# Patient Record
Sex: Female | Born: 1955
Health system: Southern US, Community
[De-identification: ages and names within clinical notes are randomized; demographics above are authoritative.]

## PROBLEM LIST (undated history)

## (undated) DIAGNOSIS — J189 Pneumonia, unspecified organism: Secondary | ICD-10-CM

## (undated) DIAGNOSIS — E7800 Pure hypercholesterolemia, unspecified: Secondary | ICD-10-CM

## (undated) DIAGNOSIS — F988 Other specified behavioral and emotional disorders with onset usually occurring in childhood and adolescence: Secondary | ICD-10-CM

## (undated) DIAGNOSIS — T7840XA Allergy, unspecified, initial encounter: Secondary | ICD-10-CM

## (undated) DIAGNOSIS — E669 Obesity, unspecified: Secondary | ICD-10-CM

## (undated) DIAGNOSIS — Z5189 Encounter for other specified aftercare: Secondary | ICD-10-CM

## (undated) DIAGNOSIS — K219 Gastro-esophageal reflux disease without esophagitis: Secondary | ICD-10-CM

## (undated) DIAGNOSIS — H269 Unspecified cataract: Secondary | ICD-10-CM

## (undated) DIAGNOSIS — G4730 Sleep apnea, unspecified: Secondary | ICD-10-CM

## (undated) DIAGNOSIS — M1990 Unspecified osteoarthritis, unspecified site: Secondary | ICD-10-CM

## (undated) DIAGNOSIS — F419 Anxiety disorder, unspecified: Secondary | ICD-10-CM

## (undated) DIAGNOSIS — D649 Anemia, unspecified: Secondary | ICD-10-CM

## (undated) DIAGNOSIS — T4145XA Adverse effect of unspecified anesthetic, initial encounter: Secondary | ICD-10-CM

## (undated) DIAGNOSIS — T8859XA Other complications of anesthesia, initial encounter: Secondary | ICD-10-CM

## (undated) HISTORY — PX: KNEE ARTHROSCOPY: SUR90

## (undated) HISTORY — PX: COLONOSCOPY: SHX174

## (undated) HISTORY — DX: Allergy, unspecified, initial encounter: T78.40XA

## (undated) HISTORY — DX: Gastro-esophageal reflux disease without esophagitis: K21.9

## (undated) HISTORY — DX: Encounter for other specified aftercare: Z51.89

## (undated) HISTORY — PX: POLYPECTOMY: SHX149

## (undated) HISTORY — PX: OTHER SURGICAL HISTORY: SHX169

## (undated) HISTORY — DX: Obesity, unspecified: E66.9

## (undated) HISTORY — DX: Other specified behavioral and emotional disorders with onset usually occurring in childhood and adolescence: F98.8

## (undated) HISTORY — PX: TONSILLECTOMY: SUR1361

## (undated) HISTORY — DX: Anxiety disorder, unspecified: F41.9

## (undated) HISTORY — DX: Unspecified osteoarthritis, unspecified site: M19.90

## (undated) HISTORY — PX: ADENOIDECTOMY: SUR15

## (undated) HISTORY — PX: UPPER GASTROINTESTINAL ENDOSCOPY: SHX188

## (undated) HISTORY — DX: Sleep apnea, unspecified: G47.30

## (undated) HISTORY — DX: Unspecified cataract: H26.9

## (undated) HISTORY — PX: LAPAROSCOPIC GASTRIC BANDING: SHX1100

## (undated) HISTORY — PX: UVULOPALATOPHARYNGOPLASTY: SHX827

---

## 1998-06-24 ENCOUNTER — Ambulatory Visit: Admission: RE | Admit: 1998-06-24 | Discharge: 1998-06-24 | Payer: Self-pay | Admitting: Oral and Maxillofacial Surgery

## 1998-08-19 ENCOUNTER — Other Ambulatory Visit: Admission: RE | Admit: 1998-08-19 | Discharge: 1998-08-19 | Payer: Self-pay | Admitting: Otolaryngology

## 2000-11-24 ENCOUNTER — Encounter: Admission: RE | Admit: 2000-11-24 | Discharge: 2000-11-24 | Payer: Self-pay | Admitting: Family Medicine

## 2000-12-14 ENCOUNTER — Encounter: Admission: RE | Admit: 2000-12-14 | Discharge: 2000-12-14 | Payer: Self-pay | Admitting: Family Medicine

## 2000-12-30 ENCOUNTER — Encounter: Admission: RE | Admit: 2000-12-30 | Discharge: 2000-12-30 | Payer: Self-pay | Admitting: Family Medicine

## 2001-01-24 ENCOUNTER — Encounter: Admission: RE | Admit: 2001-01-24 | Discharge: 2001-01-24 | Payer: Self-pay | Admitting: Family Medicine

## 2001-02-28 ENCOUNTER — Encounter: Admission: RE | Admit: 2001-02-28 | Discharge: 2001-02-28 | Payer: Self-pay | Admitting: Family Medicine

## 2001-03-28 ENCOUNTER — Ambulatory Visit (HOSPITAL_BASED_OUTPATIENT_CLINIC_OR_DEPARTMENT_OTHER): Admission: RE | Admit: 2001-03-28 | Discharge: 2001-03-28 | Payer: Self-pay | Admitting: Orthopedic Surgery

## 2001-04-04 ENCOUNTER — Encounter: Admission: RE | Admit: 2001-04-04 | Discharge: 2001-04-04 | Payer: Self-pay | Admitting: Family Medicine

## 2001-04-20 ENCOUNTER — Encounter: Admission: RE | Admit: 2001-04-20 | Discharge: 2001-04-20 | Payer: Self-pay | Admitting: Family Medicine

## 2001-05-08 ENCOUNTER — Ambulatory Visit (HOSPITAL_BASED_OUTPATIENT_CLINIC_OR_DEPARTMENT_OTHER): Admission: RE | Admit: 2001-05-08 | Discharge: 2001-05-08 | Payer: Self-pay | Admitting: Family Medicine

## 2001-05-24 ENCOUNTER — Encounter: Admission: RE | Admit: 2001-05-24 | Discharge: 2001-05-24 | Payer: Self-pay | Admitting: Family Medicine

## 2001-06-20 ENCOUNTER — Encounter: Admission: RE | Admit: 2001-06-20 | Discharge: 2001-06-20 | Payer: Self-pay | Admitting: Family Medicine

## 2001-07-13 ENCOUNTER — Encounter: Admission: RE | Admit: 2001-07-13 | Discharge: 2001-07-13 | Payer: Self-pay | Admitting: Family Medicine

## 2001-08-03 ENCOUNTER — Encounter: Admission: RE | Admit: 2001-08-03 | Discharge: 2001-08-03 | Payer: Self-pay | Admitting: Family Medicine

## 2001-08-29 ENCOUNTER — Encounter: Admission: RE | Admit: 2001-08-29 | Discharge: 2001-08-29 | Payer: Self-pay | Admitting: Family Medicine

## 2001-09-07 ENCOUNTER — Encounter: Admission: RE | Admit: 2001-09-07 | Discharge: 2001-09-07 | Payer: Self-pay | Admitting: Family Medicine

## 2001-10-07 ENCOUNTER — Encounter: Admission: RE | Admit: 2001-10-07 | Discharge: 2001-10-07 | Payer: Self-pay | Admitting: Family Medicine

## 2001-11-21 ENCOUNTER — Encounter: Admission: RE | Admit: 2001-11-21 | Discharge: 2001-11-21 | Payer: Self-pay | Admitting: Family Medicine

## 2001-12-09 ENCOUNTER — Encounter: Admission: RE | Admit: 2001-12-09 | Discharge: 2001-12-09 | Payer: Self-pay | Admitting: Family Medicine

## 2001-12-21 ENCOUNTER — Encounter: Admission: RE | Admit: 2001-12-21 | Discharge: 2001-12-21 | Payer: Self-pay | Admitting: Family Medicine

## 2002-01-04 ENCOUNTER — Encounter: Admission: RE | Admit: 2002-01-04 | Discharge: 2002-01-04 | Payer: Self-pay | Admitting: Family Medicine

## 2002-01-23 ENCOUNTER — Encounter: Admission: RE | Admit: 2002-01-23 | Discharge: 2002-01-23 | Payer: Self-pay | Admitting: Family Medicine

## 2002-02-27 ENCOUNTER — Encounter: Admission: RE | Admit: 2002-02-27 | Discharge: 2002-02-27 | Payer: Self-pay | Admitting: Family Medicine

## 2002-04-05 ENCOUNTER — Encounter: Admission: RE | Admit: 2002-04-05 | Discharge: 2002-04-05 | Payer: Self-pay | Admitting: Family Medicine

## 2002-05-10 ENCOUNTER — Encounter: Admission: RE | Admit: 2002-05-10 | Discharge: 2002-05-10 | Payer: Self-pay | Admitting: Family Medicine

## 2002-06-14 ENCOUNTER — Encounter: Admission: RE | Admit: 2002-06-14 | Discharge: 2002-06-14 | Payer: Self-pay | Admitting: Family Medicine

## 2002-07-12 ENCOUNTER — Encounter: Admission: RE | Admit: 2002-07-12 | Discharge: 2002-07-12 | Payer: Self-pay | Admitting: Family Medicine

## 2002-08-04 ENCOUNTER — Encounter: Admission: RE | Admit: 2002-08-04 | Discharge: 2002-08-04 | Payer: Self-pay | Admitting: Family Medicine

## 2002-08-07 ENCOUNTER — Encounter: Payer: Self-pay | Admitting: Family Medicine

## 2002-08-07 ENCOUNTER — Encounter: Admission: RE | Admit: 2002-08-07 | Discharge: 2002-08-07 | Payer: Self-pay | Admitting: Family Medicine

## 2002-08-23 ENCOUNTER — Encounter: Admission: RE | Admit: 2002-08-23 | Discharge: 2002-08-23 | Payer: Self-pay | Admitting: Family Medicine

## 2002-09-11 ENCOUNTER — Encounter: Admission: RE | Admit: 2002-09-11 | Discharge: 2002-09-11 | Payer: Self-pay | Admitting: Family Medicine

## 2002-09-19 ENCOUNTER — Other Ambulatory Visit: Admission: RE | Admit: 2002-09-19 | Discharge: 2002-09-19 | Payer: Self-pay | Admitting: Obstetrics and Gynecology

## 2002-10-09 ENCOUNTER — Encounter: Admission: RE | Admit: 2002-10-09 | Discharge: 2002-10-09 | Payer: Self-pay | Admitting: Sports Medicine

## 2002-11-09 ENCOUNTER — Encounter: Admission: RE | Admit: 2002-11-09 | Discharge: 2002-11-09 | Payer: Self-pay | Admitting: Family Medicine

## 2002-12-21 ENCOUNTER — Encounter: Admission: RE | Admit: 2002-12-21 | Discharge: 2002-12-21 | Payer: Self-pay | Admitting: Family Medicine

## 2003-03-01 ENCOUNTER — Encounter: Admission: RE | Admit: 2003-03-01 | Discharge: 2003-03-01 | Payer: Self-pay | Admitting: Sports Medicine

## 2004-01-09 ENCOUNTER — Encounter: Admission: RE | Admit: 2004-01-09 | Discharge: 2004-01-09 | Payer: Self-pay | Admitting: Family Medicine

## 2004-01-29 ENCOUNTER — Encounter: Payer: Self-pay | Admitting: Internal Medicine

## 2004-02-06 ENCOUNTER — Inpatient Hospital Stay (HOSPITAL_COMMUNITY): Admission: EM | Admit: 2004-02-06 | Discharge: 2004-02-08 | Payer: Self-pay | Admitting: Internal Medicine

## 2004-02-11 ENCOUNTER — Encounter: Payer: Self-pay | Admitting: Internal Medicine

## 2004-02-21 ENCOUNTER — Other Ambulatory Visit: Admission: RE | Admit: 2004-02-21 | Discharge: 2004-02-21 | Payer: Self-pay | Admitting: Obstetrics and Gynecology

## 2004-04-18 ENCOUNTER — Encounter: Admission: RE | Admit: 2004-04-18 | Discharge: 2004-07-17 | Payer: Self-pay | Admitting: Surgery

## 2004-05-29 ENCOUNTER — Ambulatory Visit (HOSPITAL_COMMUNITY): Admission: RE | Admit: 2004-05-29 | Discharge: 2004-05-29 | Payer: Self-pay | Admitting: Surgery

## 2004-06-04 ENCOUNTER — Ambulatory Visit (HOSPITAL_COMMUNITY): Admission: RE | Admit: 2004-06-04 | Discharge: 2004-06-04 | Payer: Self-pay | Admitting: Surgery

## 2004-06-16 ENCOUNTER — Encounter (INDEPENDENT_AMBULATORY_CARE_PROVIDER_SITE_OTHER): Payer: Self-pay | Admitting: *Deleted

## 2004-06-16 ENCOUNTER — Observation Stay (HOSPITAL_COMMUNITY): Admission: RE | Admit: 2004-06-16 | Discharge: 2004-06-17 | Payer: Self-pay | Admitting: Surgery

## 2004-06-30 ENCOUNTER — Emergency Department (HOSPITAL_COMMUNITY): Admission: EM | Admit: 2004-06-30 | Discharge: 2004-06-30 | Payer: Self-pay | Admitting: Emergency Medicine

## 2004-07-29 ENCOUNTER — Encounter: Admission: RE | Admit: 2004-07-29 | Discharge: 2004-07-29 | Payer: Self-pay | Admitting: Surgery

## 2004-11-28 ENCOUNTER — Encounter: Admission: RE | Admit: 2004-11-28 | Discharge: 2005-02-26 | Payer: Self-pay | Admitting: Surgery

## 2005-05-05 ENCOUNTER — Encounter: Admission: RE | Admit: 2005-05-05 | Discharge: 2005-08-03 | Payer: Self-pay | Admitting: Surgery

## 2005-09-29 ENCOUNTER — Encounter: Admission: RE | Admit: 2005-09-29 | Discharge: 2005-09-29 | Payer: Self-pay | Admitting: Family Medicine

## 2006-12-02 ENCOUNTER — Ambulatory Visit (HOSPITAL_BASED_OUTPATIENT_CLINIC_OR_DEPARTMENT_OTHER): Admission: RE | Admit: 2006-12-02 | Discharge: 2006-12-02 | Payer: Self-pay | Admitting: Orthopedic Surgery

## 2007-11-15 ENCOUNTER — Other Ambulatory Visit: Admission: RE | Admit: 2007-11-15 | Discharge: 2007-11-15 | Payer: Self-pay | Admitting: Family Medicine

## 2008-05-14 ENCOUNTER — Encounter: Admission: RE | Admit: 2008-05-14 | Discharge: 2008-05-14 | Payer: Self-pay | Admitting: Family Medicine

## 2008-05-17 ENCOUNTER — Encounter: Admission: RE | Admit: 2008-05-17 | Discharge: 2008-05-17 | Payer: Self-pay | Admitting: Family Medicine

## 2008-12-26 ENCOUNTER — Encounter (INDEPENDENT_AMBULATORY_CARE_PROVIDER_SITE_OTHER): Payer: Self-pay | Admitting: *Deleted

## 2009-01-26 HISTORY — PX: ABDOMINOPLASTY: SUR9

## 2009-02-05 ENCOUNTER — Inpatient Hospital Stay (HOSPITAL_COMMUNITY): Admission: AD | Admit: 2009-02-05 | Discharge: 2009-02-07 | Payer: Self-pay | Admitting: Plastic Surgery

## 2009-02-07 ENCOUNTER — Encounter (INDEPENDENT_AMBULATORY_CARE_PROVIDER_SITE_OTHER): Payer: Self-pay | Admitting: *Deleted

## 2009-04-23 ENCOUNTER — Encounter: Admission: RE | Admit: 2009-04-23 | Discharge: 2009-04-23 | Payer: Self-pay | Admitting: Family Medicine

## 2009-11-19 ENCOUNTER — Other Ambulatory Visit: Admission: RE | Admit: 2009-11-19 | Discharge: 2009-11-19 | Payer: Self-pay | Admitting: Family Medicine

## 2010-04-15 ENCOUNTER — Encounter: Payer: Self-pay | Admitting: Internal Medicine

## 2010-04-15 ENCOUNTER — Ambulatory Visit: Payer: Self-pay | Admitting: Gastroenterology

## 2010-04-15 ENCOUNTER — Telehealth: Payer: Self-pay | Admitting: Internal Medicine

## 2010-04-15 DIAGNOSIS — R112 Nausea with vomiting, unspecified: Secondary | ICD-10-CM | POA: Insufficient documentation

## 2010-04-15 DIAGNOSIS — R1319 Other dysphagia: Secondary | ICD-10-CM | POA: Insufficient documentation

## 2010-04-15 DIAGNOSIS — K219 Gastro-esophageal reflux disease without esophagitis: Secondary | ICD-10-CM | POA: Insufficient documentation

## 2010-04-15 DIAGNOSIS — Z9884 Bariatric surgery status: Secondary | ICD-10-CM | POA: Insufficient documentation

## 2010-04-15 HISTORY — DX: Nausea with vomiting, unspecified: R11.2

## 2010-04-18 ENCOUNTER — Telehealth: Payer: Self-pay | Admitting: Internal Medicine

## 2010-04-30 ENCOUNTER — Ambulatory Visit (HOSPITAL_COMMUNITY): Admission: RE | Admit: 2010-04-30 | Discharge: 2010-04-30 | Payer: Self-pay | Admitting: Internal Medicine

## 2010-04-30 ENCOUNTER — Ambulatory Visit: Payer: Self-pay | Admitting: Internal Medicine

## 2010-05-01 ENCOUNTER — Ambulatory Visit (HOSPITAL_COMMUNITY): Admission: RE | Admit: 2010-05-01 | Discharge: 2010-05-01 | Payer: Self-pay | Admitting: Internal Medicine

## 2010-05-01 ENCOUNTER — Encounter: Payer: Self-pay | Admitting: Internal Medicine

## 2010-07-18 ENCOUNTER — Encounter: Payer: Self-pay | Admitting: Internal Medicine

## 2010-07-28 ENCOUNTER — Encounter
Admission: RE | Admit: 2010-07-28 | Discharge: 2010-07-28 | Payer: Self-pay | Source: Home / Self Care | Attending: Surgery | Admitting: Surgery

## 2010-08-04 ENCOUNTER — Encounter: Admission: RE | Admit: 2010-08-04 | Discharge: 2010-08-04 | Payer: Self-pay | Admitting: Family Medicine

## 2010-08-14 ENCOUNTER — Encounter: Payer: Self-pay | Admitting: Internal Medicine

## 2010-08-14 ENCOUNTER — Telehealth: Payer: Self-pay | Admitting: Internal Medicine

## 2010-10-19 ENCOUNTER — Encounter: Payer: Self-pay | Admitting: Family Medicine

## 2010-10-28 NOTE — Progress Notes (Signed)
Summary: TRIAGE-DYSPHAGIA  Phone Note Call from Patient Call back at [phone]   Caller: Patient Call For: Dr. Juanda Chance Reason for Call: Talk to Nurse Summary of Call: severe dysphagia... trouble swallowing liquids... would like to be worked in Initial call taken by: Vallarie Mare,  April 15, 2010 10:35 AM  Follow-up for Phone Call        Pt. c/o dysphagia for over 1 year. Dysphagia worsening for 2 weeks, this morning severe dysphagia and pain in esophagus.  Pt. will see Amy Esterwood PAC today at 2:30pm. She will remain NPO until OV. Follow-up by: Laureen Ochs LPN,  April 15, 2010 11:43 AM

## 2010-10-28 NOTE — Letter (Signed)
Summary: Patient Notice-[facility]  [address] [place], [place] [address]   Phone: [phone]  Fax: [phone]        May 01, 2010 MRN: [id number]    CHARISSE WENDELL [address] [place], [place]  [address]    Dear Ms. Cedillo,  I am pleased to inform you that the biopsies taken during your recent endoscopic examination did not show any evidence of cancer upon pathologic examination.  However, your biopsies indicate you have a condition known as Barrett's esophagus. While not cancer, it is pre-cancerous (can progress to cancer) and needs to be monitored with repeat endoscopic examination and biopsies.  Fortunately, it is quite rare that this develops into cancer, but careful monitoring of the condition along with taking your medication as prescribed is important in reducing the risk of developing cancer.  It is my recommendation that you have a repeat upper gastrointestinal endoscopic examination in 2_ years.  Additional information/recommendations:  __Please call [phone] to schedule a return visit to further      evaluate your condition.  x__Continue with treatment plan as outlined the day of your exam.  Please call us if you have or develop heartburn, reflux symptoms, any swallowing problems, or if you have questions about your condition that have not been fully answered at this time.  Sincerely,  Hart Carwin MD  This letter has been electronically signed by your physician.  Appended Document: Patient Notice-[facility] letter mailed to patient's home

## 2010-10-28 NOTE — Letter (Signed)
Summary: [facility]   Imported By: Sherian Rein 08/18/2010 11:46:32  _____________________________________________________________________  External Attachment:    Type:   Image     Comment:   External Document

## 2010-10-28 NOTE — Progress Notes (Signed)
Summary: Change PPI  ---- Converted from flag ----   #60, 3 refills---- 08/12/2010 1:19 PM, Dottie Nelson-Smith CMA (AAMA) wrote: Dr Juanda Chance- I have a note from patient's pharmacy that her insurance prefers her to take either omeprazole or pantoprazole instead of Aciphex. From the notes I can find, She was last given aciphex 20 mg ONCE daily dosing on 04/18/10 w/3 refills. However, per your endoscopy note, she should be taking Aciphex TWO times a day. I see that she has had some gastric surgery and symptoms so before I change anything, I wanted to make sure that you would be okay with her switching medications...Marland KitchenMarland KitchenAND, do you truly want her on two times a day dosing or is she okay on once daily dosing (she had barretts esophagus on pathology)? ------------------------------ ---- 08/13/2010 10:32 PM, Hart Carwin MD wrote: please stay on twice a day dose of PPI, may have Pantoprazole 40 mg by mouth  two times a day.   Phone Note Outgoing Call   Call placed by: Lamona Curl CMA Duncan Dull),  August 14, 2010 8:15 AM Call placed to: Patient Summary of Call: I have spoken to patient and advised her of her insurance company wishes that she try pantoprazole and/or omeprazole rather than Aciphex. Advised her that per Dr Regino Schultze recommendations, it is okay for her to be on pantoprazole instead of Aciphex. I have also asked the patient if she has indeed been taking Aciphex twice daily or once daily. She has only been taking her medication once daily. I have asked her to increase her PPI to twice daily dosing and have told her that we will send a new prescription to her pharmacy to reflect recommended changes in her medication. Patient verbalizes understanding. Initial call taken by: Lamona Curl CMA Duncan Dull),  August 14, 2010 8:18 AM    New/Updated Medications: PROTONIX 40 MG TBEC (PANTOPRAZOLE SODIUM) Take 1 tablet by mouth two times a day (pharmacy-please d/c any remaining refills on  Aciphex) Prescriptions: PROTONIX 40 MG TBEC (PANTOPRAZOLE SODIUM) Take 1 tablet by mouth two times a day (pharmacy-please d/c any remaining refills on Aciphex)  #60 x 3   Entered by:   Lamona Curl CMA (AAMA)   Authorized by:   Hart Carwin MD   Signed by:   Lamona Curl CMA (AAMA) on 08/14/2010   Method used:   [facility]        [facility]  [phone]* (retail)       [address] [place], [place]  [address]       Ph: [phone] or [phone]       Fax: [phone]   RxID:   [id number]

## 2010-10-28 NOTE — Procedures (Signed)
Summary: Upper Endoscopy  Patient: [name] Note: All result statuses are Final unless otherwise noted.  Tests: (1) Upper Endoscopy (EGD)   EGD Upper Endoscopy       DONE     [facility]     [address] [place], [place]  [address]           ENDOSCOPY PROCEDURE REPORT           PATIENT:  Terri [name], Terri [name]  MR#:  [id number]     BIRTHDATE:  Feb 07, 1956, 53 yrs. old  GENDER:  female           ENDOSCOPIST:  Hedwig Morton. Juanda Chance, MD     Referred by:  Carolin Coy, M.D.           PROCEDURE DATE:  04/30/2010     PROCEDURE:  EGD with biopsy     ASA CLASS:  Class I     INDICATIONS:  vomiting, GERD s/p remote lap band 6 yrs ago, now     severe reflux refractory to PPI's, early satiety, wght loss 10 lbs                 MEDICATIONS:   Versed 6 mg, Fentanyl 50 mcg     TOPICAL ANESTHETIC:  Cetacaine Spray           DESCRIPTION OF PROCEDURE:   After the risks benefits and     alternatives of the procedure were thoroughly explained, informed     consent was obtained.  The  endoscope was introduced through the     mouth and advanced to the second portion of the duodenum, without     limitations.  The instrument was slowly withdrawn as the mucosa     was fully examined.     <<PROCEDUREIMAGES>>           Post-operative change was noted in the body of the stomach (see     image1, image2, image5, image6, and image7). obstruction at 44 cm,     small 4-5 cm gastric pouch with retained food freely rexluxing     into the esophagus ( z-line at 40cm), obstruction due to a Band     placed surgically in 2005  irregular Z-line. With standard     forceps, a biopsy was obtained and sent to pathology (see image9     and image8).  Retained food was present in the cardia (see image7     and image1). liquid food retained above the band  Otherwise the     examination was normal (see image3 and image4).    Retroflexed     views revealed no abnormalities.    The scope was then withdrawn     from the  patient and the procedure completed.           COMPLICATIONS:  None           ENDOSCOPIC IMPRESSION:     1) Post-operative change in the body of the stomach     2) Irregular Z-line     3) Food, retained in the cardia     4) Otherwise normal examination     obstruction at the cardia due to surgical band, with evidence of     retained food and free reflux into the esophagus, the slipping of     the band is a possibility,     RECOMMENDATIONS:     1) Anti-reflux regimen to be follow  small feedings and liquids     continue Aciphex 20 bid           Pt to see Dr Ezzard Standing to discuss relaxing of the band     UGI series to assess level and degree of the obstruction           REPEAT EXAM:  In 0 year(s) for.           ______________________________     Hedwig Morton. Juanda Chance, MD           CC:           n.     eSIGNED:   Hedwig Morton. Brodie at 04/30/2010 11:14 AM           Dorna Leitz, [id number]  Note: An exclamation mark (!) indicates a result that was not dispersed into the flowsheet. Document Creation Date: 04/30/2010 11:15 AM _______________________________________________________________________  (1) Order result status: Final Collection or observation date-time: 04/30/2010 11:01 Requested date-time:  Receipt date-time:  Reported date-time:  Referring Physician:   Ordering Physician: Lina Sar [phone]) Specimen Source:  Source: Launa Grill Order Number: [id number] Lab site:   Appended Document: Upper Endoscopy    Clinical Lists Changes  Orders: Added new Test order of [facility]) - Signed

## 2010-10-28 NOTE — Procedures (Signed)
Summary: Prep/[facility]  Prep/[facility]   Imported By: Lester Sarcoxie 04/17/2010 09:33:26  _____________________________________________________________________  External Attachment:    Type:   Image     Comment:   External Document

## 2010-10-28 NOTE — Procedures (Signed)
Summary: COLONOSCOPY   Colonoscopy  Procedure date:  01/29/2004  Findings:      Location:  [facility].    Colonoscopy  Procedure date:  01/29/2004  Findings:      Location:  [facility].    Patient Name: Terri [name], Terri [name] MRN:  Procedure Procedures: Colonoscopy CPT: [address].    with Hot Biopsy(s)CPT: [id number].  Personnel: Endoscopist: Jett Fukuda L. Juanda Chance, MD.  Exam Location: Exam performed in Outpatient Clinic. Outpatient  Patient Consent: Procedure, Alternatives, Risks and Benefits discussed, consent obtained, from patient. Consent was obtained by the RN.  Indications Symptoms: rectal pai, itching, hx of fissure, .  History  Current Medications: Patient is not currently taking Coumadin.  Pre-Exam Physical: Performed Jan 29, 2004. Cardio-pulmonary exam, Rectal exam, HEENT exam , Abdominal exam, Extremity exam, Neurological exam, Mental status exam WNL.  Exam Exam: Extent of exam reached: Cecum, extent intended: Cecum.  The cecum was identified by appendiceal orifice and IC valve. Colon retroflexion performed. Images taken. ASA Classification: I. Tolerance: good.  Monitoring: Pulse and BP monitoring, Oximetry used. Supplemental O2 given.  Colon Prep Used Miralax for colon prep. Prep results: good.  Sedation Meds: Patient assessed and found to be appropriate for moderate (conscious) sedation. Fentanyl 50 mcg. given IV. Versed 5 mg. given IV.  Findings NORMAL EXAM: Cecum.  - MULTIPLE POLYPS: Rectum. minimum size 3 mm, maximum size 4 mm. Procedure:  hot biopsy, removed, Polyp retrieved, 2 polyps ICD9: Colon Polyps: 211.3. Comments: diminutive polyps at 5 cm from rectal os.   Assessment Abnormal examination, see findings above.  Diagnoses: 211.3: Colon Polyps.   Comments: s/p polypectomy times two Events  Unplanned Interventions: No intervention was required.  Unplanned Events: There were no complications. Plans Medication Plan: Await  pathology.  Patient Education: Patient given standard instructions for: Yearly hemoccult testing recommended. Patient instructed to get routine colonoscopy every 5 years.  Comments: pt has an appointment with dr L.Crow-LomAX TOMORROW CONCERNING ANAL WARTS Disposition: After procedure patient sent to recovery. After recovery patient sent home.    CC: [name]  This report was created from the original endoscopy report, which was reviewed and signed by the above listed endoscopist.

## 2010-10-28 NOTE — Procedures (Signed)
Summary: SIGMOIDOSCOPY   Flexible Sigmoidoscopy  Procedure date:  02/11/2004  Comments:      Location: [facility].    Patient Name: Terri, [name] MRN:  Procedure Procedures: Flexible Proctosigmoidoscopy CPT: [address].  Personnel: Endoscopist: Elliona Doddridge L. Juanda Chance, MD.  Exam Location: Exam performed in Outpatient Clinic. Outpatient  Patient Consent: Procedure, Alternatives, Risks and Benefits discussed, consent obtained, from patient.  Indications  Evaluation of: Anemia  Symptoms: Hematochezia. post polypectomy bleed, pt  had a colonoscopy and rectal polypectomy times two on 01/29/04 using hot biopsies.  Surveillance of: Last exam: May, 2005.  History  Current Medications: Patient is not currently taking Coumadin.  Pre-Exam Physical: Performed Feb 11, 2004. Cardio-pulmonary exam  WNL. Rectal exam, HEENT exam , Abdominal exam, Extremity exam, Neurological exam, Mental status exam WNL.  Exam Exam: Extent visualized: Descending Colon. Extent of exam: 50 cm. Colon retroflexion performed. Images taken. ASA Classification: I. Tolerance: good.  Colon Prep Used Fleets enema for colon prep. Prep: good.  Sedation Meds: Fentanyl 100 mcg. given IV. Versed 5 given IV.  Findings - SOLITARY ULCER: Rectum. Maximum size: 1 mm. ICD9: .   Assessment Abnormal examination, see findings above.  Diagnoses: .   Comments: healing postpolypectomy ulcer Events  Unplanned Intervention: No intervention was required.  Unplanned Events: There were no complications. Plans  Post Exam Instructions: No aspirin or non-steroidal containing medications: 2 weeks.  Medication Plan: Hemorrhoidal Medications: Anusol Suppositories 1 ac, starting Feb 11, 2004  Iron: 1 QD, starting Feb 11, 2004   Comments: low residue and soft diet for several days Disposition: After procedure patient sent to recovery. After recovery patient sent home.    CC: Terri [name]  This report was  created from the original endoscopy report, which was reviewed and signed by the above listed endoscopist.

## 2010-10-28 NOTE — Discharge Summary (Signed)
Summary: DISCHARGE SUMMARY  NAMERICKIA, FREEBURG                  ACCOUNT NO.:  [id number]      MEDICAL RECORD NO.:  [id number]          PATIENT TYPE:  INP      LOCATION:  1533                         FACILITY:  [facility]      PHYSICIAN:  Alfredia Ferguson, M.D.  DATE OF BIRTH:  04-13-1956      DATE OF ADMISSION:  02/05/2009   DATE OF DISCHARGE:  02/07/2009                                  DISCHARGE SUMMARY      ADMISSION DIAGNOSES:   1. Postoperative bleeding following abdominoplasty performed at       [facility].   2. Acute anemia secondary to number 1.      DISCHARGE DIAGNOSES:   1. Postoperative bleeding following abdominoplasty performed at       [facility].   2. Acute anemia secondary to number 1.      OPERATIONS PERFORMED:  None.      CONSULTATIONS PROCURED:  None.      HISTORY OF PRESENT ILLNESS:  This is a 55 year old woman who has   previously undergone gastric bypass procedure with Lap-Band technique.   The patient has lost approximately 100 pounds and was left with a   significant amount of redundant skin in her abdomen.  She underwent an   extended abdominoplasty as an outpatient at the [facility] on Feb 04, 2009.  Her surgical procedure was uncomplicated   with minimal bleeding.  On the evening following surgery while at the   Surgical Center overnight stay, the patient developed some bleeding as   noted by increased drainage in her Blake drains.  The bleeding continued   through the evening and she ultimately ended up putting out over 500 mL   of bloody drainage.  In the middle of the evening the patient's   hemoglobin was measured and found to be 9.9.  On Feb 05, 2009, the day   of her discharge from the Surgical Center, her hemoglobin at the center   was also noted to be 5.6.  She was transferred to [facility]   for admission and transfusion and possible reexploration.      PAST MEDICAL HISTORY:   1.  Significant for obesity.   2. Depression.   3. Attention deficit disorder.      PAST SURGICAL HISTORY:  gastric bypass with a Lap-Band technique.      ALLERGIES:  None.      MEDICATIONS:   1. Prozac 40 mg daily.   2. Adderall 40 mg daily.   3. Multivitamins.   4. Roxicet 1-2 teaspoons every 4 hours.      PHYSICAL EXAMINATION:  At the time of admission the patient had no   evidence of abdominal hematomas.   ABDOMEN:  Soft.  She was draining bloody drainage from her drains,   although the drainage output had slowed.   HEENT:  Normal.   CARDIAC EXAM:  Normal S1 and S2 without murmurs.   RESPIRATORY:  Clear breath sounds to auscultation.   EXTREMITIES:  No pedal edema, no evidence of DVT.      ADMISSION LABORATORY VALUES:  The patient's hemoglobin on admission was   7.7 which was a bit higher than what was noted at the Surgical Center.      HOSPITAL COURSE:  The patient was placed in a monitored bed.  She   initially underwent transfusion with 2 units of packed red blood cells   and was placed at bed rest.  The patient's hemoglobin climbed into the   low 8 range.  I opted to give a third unit of blood which brought her   hemoglobin up to 9.4.  This was over the first 24 hours of admission.   Hemoglobin was rechecked on the afternoon of Feb 06, 2009 and noted to   have dropped a bit down to 8.9.  I opted to observe her for one   additional day.  On the day of discharge hemoglobin was repeated and   hemoglobin was back up to 9.4.  Her PT/PTT was normal on the day of   discharge.  The character of the drainage had changed in that it had   become thin and pink.  The drain output had declined significantly.  The   patient is able to ambulate without difficulty.  She has no significant   pain.  She is tolerating a regular diet.  She has been afebrile   throughout her entire hospital course.  The patient's husband   understands clearly how to care for her in terms of managing the drains.    Followup will be provided in my office on Feb 11, 2009.      DISCHARGE MEDICATIONS:  Include:   1. Prozac 40 mg.   2. Adderall 40 mg.   3. Multivitamins with iron.   4. Ceftin 2 teaspoons t.i.d.   5. Roxicet 1-2 teaspoons every 4 hours.      Prescriptions have previously been given to the patient prior to this   admission.      CONDITION AT DISCHARGE:  Improved.               Alfredia Ferguson, M.D.   Electronically Signed            WBB/MEDQ  D:  02/07/2009  T:  02/07/2009  Job:  [id number]

## 2010-10-28 NOTE — Miscellaneous (Signed)
Summary: [employer] Approval-Pantoprazole       Case ID: [id number] Member Number: [id number] Case Type: Initial Review Case Start Date: 08/14/2010 Case Status: Coverage has been APPROVED. You will receive a confirmation letter confirming approval of this medication. The patient will also be notified of this approval via an automated outbound phone call or a letter. Please allow approximately 2 hours to update our system with the approval. Once updated, the prescription can be re-submitted.   Coverage Start Date: 07/24/2010 Coverage End Date: 08/14/2011  Patient First Name: Terri [name] Patient Last Name: FITZSIMMONS DOB: 1956-09-02 Patient Street Address: [address]   Patient City: [place] Patient State: [place] Patient Zip: [phone]  Drug Name & Strength: Pantoprazole Sodium 40 Mg

## 2010-10-28 NOTE — Letter (Signed)
Summary: Recall Colonoscopy Date Change Letter  [facility]  [address] [place], [place] [address]   Phone: [phone]  Fax: [phone]      December 26, 2008 MRN: [id number]   [facility] [address] [place], [place]  [address]   Dear Ms. Silva,   Previously you were recommended to have a repeat colonoscopy around this time. Your chart was recently reviewed by Dr. Juanda Chance of [facility]. Follow up colonoscopy is now recommended in May 2012. This revised recommendation is based on current, nationally recognized guidelines for colorectal cancer screening and polyp surveillance. These guidelines are endorsed by the American Cancer Society, [employer] on Colorectal Cancer as well as numerous other major medical organizations.  Please understand that our recommendation assumes that you do not have any new symptoms such as bleeding, a change in bowel habits, anemia, or significant abdominal discomfort. If you do have any concerning GI symptoms or want to discuss the guideline recommendations, please call to arrange an office visit at your earliest convenience. Otherwise we will keep you in our reminder system and contact you 1-2 months prior to the date listed above to schedule your next colonoscopy.  Thank you,  Hedwig Morton. Juanda Chance, M.D.  [facility] [phone]

## 2010-10-28 NOTE — Progress Notes (Signed)
Summary: Triage  Phone Note Call from Patient Call back at [phone]   Caller: Patient Call For: Dr. Juanda Chance Reason for Call: Talk to Nurse Summary of Call: Still having nausea, burning, vomiting.  Initial call taken by: Karna Christmas,  April 18, 2010 10:22 AM  Follow-up for Phone Call        Pt. saw Gunnar Fusi on 04-15-10 and has an Endo. scheduled on 04-30-10. She started the Achipex on 04-16-10. She is now able to eat pudding and tuna, but she still has nausea, some vomiting, heartburn and bloating.  1) Increase Achipex to two times a day for 5 days, then back to once daily. 2) Eat a very soft,bland diet. Try smaller amts. more frequently. 3) Gas-x,Phazyme, etc. as needed for gas & bloating. 4) [facility] scheduled Endoscopy appt. 5) If symptoms become worse call back immediately. 6) I will call pt., if new orders, after MD reviews.   Follow-up by: Laureen Ochs LPN,  April 18, 2010 10:39 AM  Additional Follow-up for Phone Call Additional follow up Details #1::        reviewed and agree Additional Follow-up by: Hart Carwin MD,  April 18, 2010 1:17 PM    New/Updated Medications: ACIPHEX 20 MG TBEC (RABEPRAZOLE SODIUM) Take 1 tab 30 min prior to breakfast every day. Prescriptions: ACIPHEX 20 MG TBEC (RABEPRAZOLE SODIUM) Take 1 tab 30 min prior to breakfast every day.  #30 x 3   Entered by:   Laureen Ochs LPN   Authorized by:   Hart Carwin MD   Signed by:   Laureen Ochs LPN on 95/62/1308   Method used:   [facility]  [phone]* (retail)       [address] [place], [place]  [address]       Ph: [phone] or [phone]       Fax: [phone]   RxID:   [id number]

## 2010-10-28 NOTE — Op Note (Signed)
Summary: LAP.BAND SURGERY   NAME:  Terri [name], Terri [name]                  ACCOUNT NO.:  [id number]   MEDICAL RECORD NO.:  [id number]          PATIENT TYPE:  OBV   LOCATION:  0154                         FACILITY:  [facility] : [facility]   PHYSICIAN:  [name], M.D.  DATE OF BIRTH:  07-15-1956   DATE OF PROCEDURE:  06/16/2004  DATE OF DISCHARGE:                                 OPERATIVE REPORT   PREOPERATIVE DIAGNOSIS:  Morbid obesity with a body mass index of 46.6.   POSTOPERATIVE DIAGNOSIS:  Morbid obesity with a body mass index of 46.6.   PROCEDURE:  Laparoscopic band placement (size 10).   SURGEON:  [name], M.D.   FIRST ASSISTANT:  Dr. Jaclynn Guarneri and Dr. Reita Chard was supervising  attending.   ANESTHESIA:  General endotracheal anesthesia.   ESTIMATED BLOOD LOSS:  Minimal.   INDICATIONS FOR PROCEDURE:  [name] is a 55 year old white female who has  been morbidly obese much of her adult life.  She has been through our  bariatric program.  She has significant comorbidities to her weight,  including hypercholesterolemia, obstructive sleep apnea on CPAP, asthma,  gastroesophageal reflux disease, and orthopedic joint disease, including a  torn meniscus of her left knee.  She has looked at both the Roux-en-Y bypass  and the lap band and has elected to proceed with a lap band.  She also  understands that this will be my first operation, although I have assisted  on several, and that Dr. Reita Chard of the [facility] region, will be in  supervising this.   OPERATIVE NOTE:  Patient is placed in a supine position and given a general  endotracheal anesthetic.  She had a Foley catheter in place, an orogastric  tube in place.  Was given 1 gm of cefotetan at the initiation of the  procedure.  Her abdomen was prepped with Betadine solution and sterilely  draped.  I entered the abdominal cavity using the Optiview Ethicon trocar  into the left upper quadrant.  I then placed a 10 mm Ethicon  trocar to the  left paramedian and a 12 mm to the right paramedian, a 5 mm left subcostal  on the right side and then a 5 mm for the liver retractor.  Abdominal  exploration revealed omentum covering most of the bowel.  The liver was  somewhat fatty.  The stomach was unremarkable.   I first started dissection along the angle of His, making a window there.  I  opened the lesser sac, found the right crus, and used a finger dissector to  blunt off the edge of the right crus, going behind the esophagus of the  stomach and anteriorly.  We then used a #10 INAMED LAP-BAND and passed this  in a pars flaccida technique around the stomach and cinched this down.   I used a sizer around, and it did not appear to be too tight when I placed  this.  I then sutured the stomach over the lap band using three sutures of 2-  0 Ethibond suture.  I thought I  got good bites of the stomach on both sides.   The band then seemed to lay in the correct orientatin.  The sizer had been  removed.  I then brought the lap band tubing out through the right subcostal  incision, extended that incision over the rectus muscle, and sewed in the  port with 2-0 Prolene, tacking the port to the abdominal wall.  The port is  at the lateral edge of the incision and maybe a centimeter below the lateral  edge of the port placement incision.  The remainder of the port was pushed  into the abdominal cavity.  I closed the skin at each site with 5-0 Vicryl  suture, and painted the wound with tincture of Benzoin and steri-stripped  it.   The patient tolerated the procedure well and was transported to the recovery  room in good condition.  Sponge and needle counts were correct at the end of  the case.     Davi   DHN/MEDQ  D:  06/16/2004  T:  06/16/2004  Job:  [id number]   cc:   Otilio Connors. [name], M.D.  [address]  [place]  [place] [address]  Fax: [phone]   Cordelia Pen A. Rosalio Macadamia, M.D.  [address]  [place]  [place]  [address]  Fax: [phone]   Lina Sar, M.D. [facility]

## 2010-10-28 NOTE — Medication Information (Signed)
Summary: Pantoprazole approved/[employer]  Pantoprazole approved/[employer]   Imported By: Sherian Rein 08/20/2010 13:57:38  _____________________________________________________________________  External Attachment:    Type:   Image     Comment:   External Document

## 2010-10-28 NOTE — Procedures (Signed)
Summary: ENDOSCOPY   EGD  Procedure date:  01/29/2004  Findings:      Location: [facility]    EGD  Procedure date:  01/29/2004  Findings:      Location: [facility]    Patient Name: Terri [name], Terri [name] MRN:  Procedure Procedures: Panendoscopy (EGD) CPT: 43235.    with biopsy(s)/brushing(s). CPT: D1846139.  Personnel: Endoscopist: Aurelia Gras L. Juanda Chance, MD.  Exam Location: Exam performed in Outpatient Clinic. Outpatient  Patient Consent: Procedure, Alternatives, Risks and Benefits discussed, consent obtained, from patient. Consent was obtained by the RN.  Indications Symptoms: Reflux symptoms for 1-5 yrs,  History  Current Medications: Patient is not currently taking Coumadin.  Pre-Exam Physical: Performed Jan 29, 2004  Cardio-pulmonary exam, HEENT exam, Abdominal exam, Extremity exam, Neurological exam, Mental status exam WNL.  Exam Exam Info: Maximum depth of insertion Duodenum, intended Duodenum. Vocal cords visualized. Gastric retroflexion performed. Images taken. ASA Classification: I. Tolerance: good.  Sedation Meds: Patient assessed and found to be appropriate for moderate (conscious) sedation. Fentanyl 50 mcg. given IV. Versed 5 mg. given IV. Cetacaine Spray 2 sprays given aerosolized.  Monitoring: BP and pulse monitoring done. Oximetry used. Supplemental O2 given  Findings DIAGNOSTIC TEST: from Distal Esophagus. Reason: r/o esophagitis. Comments: normal g-e junction, no hiatal hernia.  DIAGNOSTIC TEST: from Antrum. RUT done, results pending Reason: r/o H. Pylori.   Assessment Normal examination.  Comments: s/p biopsies and CLO test Events  Unplanned Intervention: No unplanned interventions were required.  Unplanned Events: There were no complications. Plans Medication(s): Await pathology. PPI: Pantoprazole/Protonix 40 mg QD, starting Jan 29, 2004   Patient Education: Patient given standard instructions for: Reflux.    Disposition: After procedure patient sent to recovery. After recovery patient sent home.    CC: [name]  This report was created from the original endoscopy report, which was reviewed and signed by the above listed endoscopist.

## 2010-10-28 NOTE — Letter (Signed)
Summary: EGD Instructions  [facility]  [address] [place], [place] [address]   Phone: [phone]  Fax: [phone]       Terri [name]    [birth year]    MRN: [id number]       Procedure Day /Date: 04-30-10     Arrival Time: 8:30 AM      Procedure Time: 9:30 Am     Location of Procedure:                     [facility] ( Outpatient Registration)   PREPARATION FOR ENDOSCOPY   On 8-3-11THE DAY OF THE PROCEDURE:  1.   No solid foods, milk or milk products are allowed after midnight the night before your procedure.  2.   Do not drink anything colored red or purple.  Avoid juices with pulp.  No orange juice.  3.  You may drink clear liquids until 5:30 AM , which is 4 hours before your procedure.                                                                                                CLEAR LIQUIDS INCLUDE: Water Jello Ice Popsicles Tea (sugar ok, no milk/cream) Powdered fruit flavored drinks Coffee (sugar ok, no milk/cream) Gatorade Juice: apple, white grape, white cranberry  Lemonade Clear bullion, consomm, broth Carbonated beverages (any kind) Strained chicken noodle soup Hard Candy   MEDICATION INSTRUCTIONS  Unless otherwise instructed, you should take regular prescription medications with a small sip of water as early as possible the morning of your procedure.        OTHER INSTRUCTIONS  You will need a responsible adult at least 53 years of age to accompany you and drive you home.   This person must remain in the waiting room during your procedure.  Wear loose fitting clothing that is easily removed.  Leave jewelry and other valuables at home.  However, you may wish to bring a book to read or an iPod/MP3 player to listen to music as you wait for your procedure to start.  Remove all body piercing jewelry and leave at home.  Total time from sign-in until discharge is approximately 2-3 hours.  You should go home directly  after your procedure and rest.  You can resume normal activities the day after your procedure.  The day of your procedure you should not:   Drive   Make legal decisions   Operate machinery   Drink alcohol   Return to work  You will receive specific instructions about eating, activities and medications before you leave.    The above instructions have been reviewed and explained to me by   _______________________    I fully understand and can verbalize these instructions _____________________________ Date _________

## 2010-10-28 NOTE — Letter (Signed)
Summary: [facility]   Imported By: Lester Patrick 06/18/2010 08:55:51  _____________________________________________________________________  External Attachment:    Type:   Image     Comment:   External Document

## 2010-10-28 NOTE — Assessment & Plan Note (Signed)
Summary: Severe Dyaphsgia   History of Present Illness Visit Type: Initial Visit Primary GI MD: Lina Sar MD Primary Provider: Hall Busing MD Chief Complaint: dysphagia solids and liquids History of Present Illness:   Patient is known to Dr. Juanda Chance for history of colon polyps. She hasn't been seen in several years, surveillance colonoscopy set for 2012. Patient presents with progressive dysphagia. Over the last year patient has had intermittent solid food dyphagia, some foods more problematic than others. Dysphagia has progressed over the last two months, having difficulty swallowing foods she used to tolerate. a  When she eats, feels like throat is full, and is closing up. No odynophagia. She has recently developed heartburn frequent hiccups.  Patient had lap banding in 2005. Last band adjustment at least 4 years ago. Since banding patient has had intermittent vomiting. Until a few months ago, the vomiting was not associated with nausea. Patient wonders if the vomiting may be due in part to food remaining in esophagus. Emesis varies form clear foam to both digested and sometimes undigested food. She hasn't had any significant weight loss but is becoming fearful of swallowing.   GI Review of Systems    Reports acid reflux, belching, dysphagia with liquids, dysphagia with solids, loss of appetite, nausea, and  vomiting.      Denies abdominal pain, bloating, chest pain, heartburn, vomiting blood, weight loss, and  weight gain.        Denies anal fissure, black tarry stools, change in bowel habit, constipation, diarrhea, diverticulosis, fecal incontinence, heme positive stool, hemorrhoids, irritable bowel syndrome, jaundice, light color stool, liver problems, rectal bleeding, and  rectal pain.    Current Medications (verified): 1)  Vyvanse 60 Mg Caps (Lisdexamfetamine Dimesylate) .Marland Kitchen.. 1 By Mouth Once Daily 2)  Prozac 40 Mg Caps (Fluoxetine Hcl) .Marland Kitchen.. 1 1/2 By Mouth Once Daily 3)   Multivitamins  Caps (Multiple Vitamin) .Marland Kitchen.. 1 By Mouth Once Daily 4)  Tylenol 325 Mg Tabs (Acetaminophen) .... As Needed 5)  Vitamin D 1000 Unit Tabs (Cholecalciferol) .Marland Kitchen.. 1 By Mouth Once Daily 6)  Ferretts 325 (106 Fe) Mg Tabs (Ferrous Fumarate) .Marland Kitchen.. 1 By Mouth Once Daily (Not Consistent)  Allergies (verified): No Known Drug Allergies  Past History:  Past Medical History: Anal Fissure Anemia Anxiety Disorder Asthma Chronic Headaches Adenomatous Colon Polyps Irritable Bowel Syndrome Obesity Sleep Apnea History of OSA (uses C-PAP)  Past Surgical History: Gastric Bypass (lap band) ABDOMINOPLASTY Sinus / adenoid / uvula resection 1990's  Family History: No FH of Colon Cancer: Family History of Diabetes:  Family History of Heart Disease:   Social History: Patient has never smoked.  Alcohol Use - no Daily Caffeine Use 2-4 per day Illicit Drug Use - no Patient does not get regular exercise.  Smoking Status:  never Drug Use:  no Does Patient Exercise:  no  Review of Systems       The patient complains of allergy/sinus, anxiety-new, change in vision, fatigue, and headaches-new.  The patient denies anemia, arthritis/joint pain, back pain, blood in urine, breast changes/lumps, confusion, cough, coughing up blood, depression-new, fainting, fever, hearing problems, heart murmur, heart rhythm changes, itching, menstrual pain, muscle pains/cramps, night sweats, nosebleeds, pregnancy symptoms, shortness of breath, skin rash, sleeping problems, sore throat, swelling of feet/legs, swollen lymph glands, thirst - excessive , urination - excessive , urination changes/pain, urine leakage, vision changes, and voice change.    Vital Signs:  Patient profile:   55 year old female Height:      50  inches Weight:      206 pounds BMI:     33.37 Pulse rate:   70 / minute Pulse rhythm:   regular BP sitting:   110 / 60  (left arm)  Vitals Entered By: Chales Abrahams CMA Duncan Dull) (April 15, 2010  2:34 PM)  Physical Exam  General:  Well developed, well nourished, no acute distress. Head:  Normocephalic and atraumatic. Eyes:  Conjunctiva pink, no icterus.  Mouth:  No oral lesions. Tongue moist.  Neck:  no obvious masses  Lungs:  Clear throughout to auscultation. Heart:  Regular rate and rhythm; no murmurs, rubs,  or bruits. Abdomen:  Abdomen soft, nontender, nondistended. No obvious masses or hepatomegaly.Normal bowel sounds.  Msk:  Symmetrical with no gross deformities. Normal posture. Extremities:  No palmar erythema, no edema.  Neurologic:  Alert and  oriented x4;  grossly normal neurologically. Skin:  Intact without significant lesions or rashes. Cervical Nodes:  No significant cervical adenopathy. Psych:  Alert and cooperative. Normal mood and affect.   Impression & Recommendations:  Problem # 1:  DYSPHAGIA (EAV-409.81) Assessment Deteriorated Progressive solid food dysphagia. Rule out strictures, malignancy, dysmotility. Start daily PPI. The patient will be scheduled for an EGD with biopsies/ esophageal dilation ( if indicated).  The risks and benefits of the procedure, as well as alternatives were discussed with the patient and she agrees to proceed.  Orders: ZEGD (ZEGD)  Problem # 2:  GERD (ICD-530.81) Assessment: New Heartburn and hiccups. Start PPI.  Problem # 3:  NAUSEA AND VOMITING (ICD-787.01) Assessment: Unchanged Intermittent since lap banding in 2005. Further evaluation at time of EGD Orders: ZEGD (ZEGD)  Problem # 4:  BARIATRIC SURGERY STATUS (ICD-V45.86) Assessment: Comment Only Gastric banding 2005.  Patient Instructions: 1)  We have given you Aciphex samples, take 1 tab 30 min prior to breakfast. 2)  We scheduled the Endoscopy for 04-30-10 at  9:30 AM .  3)  The Location is [facility] on the 1st floor.  4)  Directions and brochure provided. 5)  We have given you antireflux measures. 6)  Eat small bites of food and  chew the food thoroughly before swallowing. 7)  Copy sent to : Hall Busing, MD 8)  The medication list was reviewed and reconciled.  All changed / newly prescribed medications were explained.  A complete medication list was provided to the patient / caregiver.

## 2011-01-06 LAB — PROTIME-INR
INR: 0.9 (ref 0.00–1.49)
Prothrombin Time: 12.6 seconds (ref 11.6–15.2)

## 2011-01-06 LAB — CBC
HCT: 21.7 % — ABNORMAL LOW (ref 36.0–46.0)
HCT: 27.3 % — ABNORMAL LOW (ref 36.0–46.0)
Hemoglobin: 7.7 g/dL — CL (ref 12.0–15.0)
Hemoglobin: 9.4 g/dL — ABNORMAL LOW (ref 12.0–15.0)
MCHC: 34.5 g/dL (ref 30.0–36.0)
MCHC: 35.4 g/dL (ref 30.0–36.0)
MCV: 89.6 fL (ref 78.0–100.0)
MCV: 90.6 fL (ref 78.0–100.0)
Platelets: 183 10*3/uL (ref 150–400)
Platelets: 200 10*3/uL (ref 150–400)
RBC: 2.42 MIL/uL — ABNORMAL LOW (ref 3.87–5.11)
RBC: 3.01 MIL/uL — ABNORMAL LOW (ref 3.87–5.11)
RDW: 13.3 % (ref 11.5–15.5)
RDW: 14.1 % (ref 11.5–15.5)
WBC: 6.3 10*3/uL (ref 4.0–10.5)
WBC: 6.3 10*3/uL (ref 4.0–10.5)

## 2011-01-06 LAB — CROSSMATCH
ABO/RH(D): O POS
Antibody Screen: NEGATIVE

## 2011-01-06 LAB — HEMOGLOBIN AND HEMATOCRIT, BLOOD
HCT: 25.4 % — ABNORMAL LOW (ref 36.0–46.0)
HCT: 26.2 % — ABNORMAL LOW (ref 36.0–46.0)
HCT: 26.9 % — ABNORMAL LOW (ref 36.0–46.0)
Hemoglobin: 8.9 g/dL — ABNORMAL LOW (ref 12.0–15.0)
Hemoglobin: 8.9 g/dL — ABNORMAL LOW (ref 12.0–15.0)
Hemoglobin: 9.4 g/dL — ABNORMAL LOW (ref 12.0–15.0)

## 2011-01-06 LAB — APTT: aPTT: 31 seconds (ref 24–37)

## 2011-01-06 LAB — ABO/RH: ABO/RH(D): O POS

## 2011-01-06 LAB — BASIC METABOLIC PANEL
BUN: 10 mg/dL (ref 6–23)
CO2: 30 mEq/L (ref 19–32)
Calcium: 7.7 mg/dL — ABNORMAL LOW (ref 8.4–10.5)
Chloride: 101 mEq/L (ref 96–112)
Creatinine, Ser: 0.63 mg/dL (ref 0.4–1.2)
GFR calc Af Amer: >60 mL/min (ref >60)
GFR calc non Af Amer: >60 mL/min (ref >60)
Glucose, Bld: 99 mg/dL (ref 70–99)
Potassium: 4 mEq/L (ref 3.5–5.1)
Sodium: 136 mEq/L (ref 135–145)

## 2011-02-08 ENCOUNTER — Other Ambulatory Visit: Payer: Self-pay | Admitting: Internal Medicine

## 2011-02-10 NOTE — Discharge Summary (Signed)
[name], [name]                  ACCOUNT NO.:  [id number]   MEDICAL RECORD NO.:  [id number]          PATIENT TYPE:  INP   LOCATION:  1533                         FACILITY:  [facility]   PHYSICIAN:  Alfredia Ferguson, M.D.  DATE OF BIRTH:  1956/04/21   DATE OF ADMISSION:  02/05/2009  DATE OF DISCHARGE:  02/07/2009                               DISCHARGE SUMMARY   ADMISSION DIAGNOSES:  1. Postoperative bleeding following abdominoplasty performed at      [facility].  2. Acute anemia secondary to number 1.   DISCHARGE DIAGNOSES:  1. Postoperative bleeding following abdominoplasty performed at      [facility].  2. Acute anemia secondary to number 1.   OPERATIONS PERFORMED:  None.   CONSULTATIONS PROCURED:  None.   HISTORY OF PRESENT ILLNESS:  This is a 55 year old woman who has  previously undergone gastric bypass procedure with Lap-Band technique.  The patient has lost approximately 100 pounds and was left with a  significant amount of redundant skin in her abdomen.  She underwent an  extended abdominoplasty as an outpatient at the [facility] on Feb 04, 2009.  Her surgical procedure was uncomplicated  with minimal bleeding.  On the evening following surgery while at the  Surgical Center overnight stay, the patient developed some bleeding as  noted by increased drainage in her Blake drains.  The bleeding continued  through the evening and she ultimately ended up putting out over 500 mL  of bloody drainage.  In the middle of the evening the patient's  hemoglobin was measured and found to be 9.9.  On Feb 05, 2009, the day  of her discharge from the Surgical Center, her hemoglobin at the center  was also noted to be 5.6.  She was transferred to [facility]  for admission and transfusion and possible reexploration.   PAST MEDICAL HISTORY:  1. Significant for obesity.  2. Depression.  3. Attention deficit disorder.   PAST  SURGICAL HISTORY:  gastric bypass with a Lap-Band technique.   ALLERGIES:  None.   MEDICATIONS:  1. Prozac 40 mg daily.  2. Adderall 40 mg daily.  3. Multivitamins.  4. Roxicet 1-2 teaspoons every 4 hours.   PHYSICAL EXAMINATION:  At the time of admission the patient had no  evidence of abdominal hematomas.  ABDOMEN:  Soft.  She was draining bloody drainage from her drains,  although the drainage output had slowed.  HEENT:  Normal.  CARDIAC EXAM:  Normal S1 and S2 without murmurs.  RESPIRATORY:  Clear breath sounds to auscultation.  EXTREMITIES:  No pedal edema, no evidence of DVT.   ADMISSION LABORATORY VALUES:  The patient's hemoglobin on admission was  7.7 which was a bit higher than what was noted at the Surgical Center.   HOSPITAL COURSE:  The patient was placed in a monitored bed.  She  initially underwent transfusion with 2 units of packed red blood cells  and was placed at bed rest.  The patient's hemoglobin climbed into the  low 8 range.  I opted to  give a third unit of blood which brought her  hemoglobin up to 9.4.  This was over the first 24 hours of admission.  Hemoglobin was rechecked on the afternoon of Feb 06, 2009 and noted to  have dropped a bit down to 8.9.  I opted to observe her for one  additional day.  On the day of discharge hemoglobin was repeated and  hemoglobin was back up to 9.4.  Her PT/PTT was normal on the day of  discharge.  The character of the drainage had changed in that it had  become thin and pink.  The drain output had declined significantly.  The  patient is able to ambulate without difficulty.  She has no significant  pain.  She is tolerating a regular diet.  She has been afebrile  throughout her entire hospital course.  The patient's husband  understands clearly how to care for her in terms of managing the drains.  Followup will be provided in my office on Feb 11, 2009.   DISCHARGE MEDICATIONS:  Include:  1. Prozac 40 mg.  2. Adderall  40 mg.  3. Multivitamins with iron.  4. Ceftin 2 teaspoons t.i.d.  5. Roxicet 1-2 teaspoons every 4 hours.   Prescriptions have previously been given to the patient prior to this  admission.   CONDITION AT DISCHARGE:  Improved.      Alfredia Ferguson, M.D.  Electronically Signed     WBB/MEDQ  D:  02/07/2009  T:  02/07/2009  Job:  [id number]

## 2011-02-13 NOTE — Op Note (Signed)
Terri [name], Terri [name]                  ACCOUNT NO.:  [id number]   MEDICAL RECORD NO.:  [id number]          PATIENT TYPE:  AMB   LOCATION:  DSC                          FACILITY:  [facility]   PHYSICIAN:  Loreta Ave, M.D. DATE OF BIRTH:  06/02/1956   DATE OF PROCEDURE:  12/02/2006  DATE OF DISCHARGE:                               OPERATIVE REPORT   PREOPERATIVE DIAGNOSIS:  Lateral epicondylitis with partial tearing,  extensor carpi radialis brevis tendon, lateral aspect, left elbow.   POSTOPERATIVE DIAGNOSIS:  Lateral epicondylitis with partial tearing,  extensor carpi radialis brevis tendon, lateral aspect, left elbow.   PROCEDURE:  Exploration, debridement, drilling and repair, left elbow,  lateral epicondylar extensor tendon.   SURGEON:  Loreta Ave, M.D.   ASSISTANT:  Genene Churn. Denton Meek.   ANESTHESIA:  General.   BLOOD LOSS:  Minimal.   TOURNIQUET TIME:  45 minutes.   SPECIMENS:  None.   CULTURES:  None.   COMPLICATIONS:  None.   DRESSING:  Sterile compressive with splint.   DESCRIPTION OF PROCEDURE:  The patient was brought to the operating room  and placed on the operating table in supine position.  After adequate  anesthesia had been obtained, the left elbow was examined.  Full motion  and good stability.  Tourniquet applied, prepped and draped in the usual  sterile fashion.  Exsanguinated with elevation and an Esmarch.  Tourniquet inflated to 250 mmHg.  Longitudinal incision from the  epicondyle distal.  Skin and subcutaneous tissues divided.  Superficial  extensors intact and divided longitudinally.  ECRB tendon marked.  Tearing, mucinous degeneration throughout from the epicondyle all the  way down to the radial head.  All abnormal, degenerative, detached  tissue debrided back to healthy tissue down to the level of the radial  head.  The capsule involved as well.  The joint inspected.  No evidence  of posterolateral impingement, and no chondral breakdown.   Wound  thoroughly irrigated.  Epicondyle debrided and treated with multiple  drillings.  I then brought the superficial extensors back over the top  of the epicondyles, incorporating the ECRB tendon distally into a  longitudinal closure with Vicryl.  Subcutaneous and subcuticular closure  with Vicryl.  Steri-Strips applied.  Sterile compressive dressing  applied.  Tourniquet deflated and removed.  Splint applied.  Anesthesia  reversed.  Brought to the recovery room.  Tolerated surgery well with no  complications.      Loreta Ave, M.D.  Electronically Signed     DFM/MEDQ  D:  12/02/2006  T:  12/02/2006  Job:  [id number]

## 2011-02-13 NOTE — Op Note (Signed)
[name], [name]                  ACCOUNT NO.:  [id number]   MEDICAL RECORD NO.:  [id number]          PATIENT TYPE:  OBV   LOCATION:  0154                         FACILITY:  [name]   PHYSICIAN:  [name], M.D.  DATE OF BIRTH:  1956/09/03   DATE OF PROCEDURE:  06/16/2004  DATE OF DISCHARGE:                                 OPERATIVE REPORT   PREOPERATIVE DIAGNOSIS:  Morbid obesity with a body mass index of 46.6.   POSTOPERATIVE DIAGNOSIS:  Morbid obesity with a body mass index of 46.6.   PROCEDURE:  Laparoscopic band placement (size 10).   SURGEON:  [name], M.D.   FIRST ASSISTANT:  Dr. Jaclynn Guarneri and Dr. Reita Chard was supervising  attending.   ANESTHESIA:  General endotracheal anesthesia.   ESTIMATED BLOOD LOSS:  Minimal.   INDICATIONS FOR PROCEDURE:  [name] is a 55 year old white female who has  been morbidly obese much of her adult life.  She has been through our  bariatric program.  She has significant comorbidities to her weight,  including hypercholesterolemia, obstructive sleep apnea on CPAP, asthma,  gastroesophageal reflux disease, and orthopedic joint disease, including a  torn meniscus of her left knee.  She has looked at both the Roux-en-Y bypass  and the lap band and has elected to proceed with a lap band.  She also  understands that this will be my first operation, although I have assisted  on several, and that Dr. Reita Chard of the [facility] region, will be in  supervising this.   OPERATIVE NOTE:  Patient is placed in a supine position and given a general  endotracheal anesthetic.  She had a Foley catheter in place, an orogastric  tube in place.  Was given 1 gm of cefotetan at the initiation of the  procedure.  Her abdomen was prepped with Betadine solution and sterilely  draped.  I entered the abdominal cavity using the Optiview Ethicon trocar  into the left upper quadrant.  I then placed a 10 mm Ethicon trocar to the  left paramedian  and a 12 mm to the right paramedian, a 5 mm left subcostal  on the right side and then a 5 mm for the liver retractor.  Abdominal  exploration revealed omentum covering most of the bowel.  The liver was  somewhat fatty.  The stomach was unremarkable.   I first started dissection along the angle of His, making a window there.  I  opened the lesser sac, found the right crus, and used a finger dissector to  blunt off the edge of the right crus, going behind the esophagus of the  stomach and anteriorly.  We then used a #10 INAMED LAP-BAND and passed this  in a pars flaccida technique around the stomach and cinched this down.   I used a sizer around, and it did not appear to be too tight when I placed  this.  I then sutured the stomach over the lap band using three sutures of 2-  0 Ethibond suture.  I thought I got good bites of the  stomach on both sides.   The band then seemed to lay in the correct orientatin.  The sizer had been  removed.  I then brought the lap band tubing out through the right subcostal  incision, extended that incision over the rectus muscle, and sewed in the  port with 2-0 Prolene, tacking the port to the abdominal wall.  The port is  at the lateral edge of the incision and maybe a centimeter below the lateral  edge of the port placement incision.  The remainder of the port was pushed  into the abdominal cavity.  I closed the skin at each site with 5-0 Vicryl  suture, and painted the wound with tincture of Benzoin and steri-stripped  it.   The patient tolerated the procedure well and was transported to the recovery  room in good condition.  Sponge and needle counts were correct at the end of  the case.     Davi   DHN/MEDQ  D:  06/16/2004  T:  06/16/2004  Job:  [id number]   cc:   Otilio Connors. [name], M.D.  [address]  [place]  [place] [address]  Fax: [phone]   Cordelia Pen A. Rosalio Macadamia, M.D.  [address]  [place]  [place] [address]  Fax: [phone]   Lina Sar, M.D. [facility]

## 2011-02-13 NOTE — Op Note (Signed)
. [facility]  Patient:    MODESTINE, SCHERZINGER                           MRN: [id number] Proc. Date: 03/25/01 Attending:  Loreta Ave, M.D.                           Operative Report  PREOPERATIVE DIAGNOSES:  1. Left knee chondromalacia of patella.  2. Degenerative joint disease.  3. Medial meniscus tear.  POSTOPERATIVE DIAGNOSES:  1. Left knee chondromalacia of patella.  2. Degenerative joint disease.  3. Medial meniscus tear.  OPERATION/PROCEDURE:  1. Left knee examination under anesthesia.  2. Arthroscopy, left knee.  3. Partial medial meniscectomy, left knee.  4. Extensive chondroplasty of patella and to lesser extent medial     compartment, left knee.  5. Excision of medial plica, left knee.  SURGEON: Loreta Ave, M.D.  ASSISTANT: Arlys John D. Petrarca, P.A.-C.  ANESTHESIA: General.  SPECIMENS: None.  CULTURES: None.  COMPLICATIONS: None.  DRESSING: Soft compressive.  DESCRIPTION OF PROCEDURE: The patient was brought to the operating room and placed on the operating table in the supine position.  After adequate anesthesia had been obtained the left knee was examined.  Full motion, good stability.  Lateral patella tracking but without marked tethering. Patellofemoral crepitus.  Tequin and leg holder applied.  Leg prepped and draped in usual sterile fashion.  Three portals created, one superolateral, one each medial and lateral parapatella.  In-flow catheter introduced and knee distended.  Arthroscope introduced and knee inspected.  Extensive hypertrophic chondral flaps on the patella.  Treated with chondroplasty to a stable surface.  Trochlea looked good, as did tracking.  Lateral release not indicated.  Stout medial plica with abrasive changes on the condyle.  Plica excised.  Hemostasis obtained with cautery.  Medial compartment revealed extensive degenerative tearing posterior half medial meniscus.  Saucerized ______.  Tapered  smoothly with a shaver, saving a little bit of the rim in the back and tapering into remaining meniscus.  Grade 3 changes throughout, treated with chondroplasty.  Focal grade 4 changes posteriorly.  Cruciate ligaments intact.  Lateral meniscus lateral compartment intact except for some mild fraying on the plateau.  At completion the entire knee examined and no other significant findings appreciated.  Instruments and fluid removed. Portals and knee injected with Marcaine.  Portals closed with 4-0 Nylon. Sterile compressive dressing applied.  Anesthesia reversed.  Brought to recovery room.  Tolerated surgery well.  No complications. DD:  03/25/01 TD:  03/26/01 Job: [id number]

## 2011-02-13 NOTE — Consult Note (Signed)
Terri [name], WIECHMAN                  ACCOUNT NO.:  [id number]   MEDICAL RECORD NO.:  [id number]          PATIENT TYPE:  EMS   LOCATION:  MAJO                         FACILITY:  [facility]   PHYSICIAN:  [name], M.D.  DATE OF BIRTH:  1955/10/20   DATE OF CONSULTATION:  06/30/2004  DATE OF DISCHARGE:                                   CONSULTATION   REASON FOR CONSULTATION:  The patient is a 55 year old white female who is  morbidly obese with a BMI of about 48, who is two weeks out from a  laparoscopic banding procedure at [facility].   HISTORY OF PRESENT ILLNESS:  I had seen her once postoperatively.  She was  doing well.  She had tolerated the procedure liquid portion of her diet  well, and advanced to some puree foods today.  She was at work at [employer] when she developed sudden and severe chest pain and became  diaphoretic.  She was taken to the emergency room.  It certainly sounds by her history that this is related to her food.  She  has had no real shortness of breath, although she did have severe chest  pain.  She had some nausea, no vomiting.   PHYSICAL EXAMINATION:  VITAL SIGNS:  Blood pressure 112/64, pulse 88,  respirations 20, saturation 100% on room air.  HEART:  A regular rate and rhythm.  LUNGS:  Clear to auscultation.  ABDOMEN:  Shows bowel sounds with her wounds all healed.   LABORATORY DATA:  The labs which I have show a white blood count of 10,000,  hemoglobin 13, hematocrit 38.  Sodium 138, potassium 3.8, chloride 105, CO2  of 20.  She had a chest x-ray which showed no pneumothorax.  She had an electrocardiogram which showed a normal sinus rhythm.  She had a Gastrografin swallow which Dr. Roney Marion. Fisher did.  I was present.  This showed lap banding of right position, though she does have slow  emptying of her pouch.   IMPRESSION/PLAN:  1.  Chest pain, probably esophageal spasms, secondary to advancing her diet      too  quickly.  She is going to go back to her liquid diet.  She will      check in with me in four weeks for a questionable lap adjustable      banding.  She is to go back on her liquid diet for at least two more      weeks.  To call for any interval problems.   1.  Morbid Obesity      Davi   DHN/MEDQ  D:  06/30/2004  T:  06/30/2004  Job:  [id number]

## 2011-02-13 NOTE — H&P (Signed)
NAME:  Terri [name], Terri [name]                            ACCOUNT NO.:  [id number]   MEDICAL RECORD NO.:  [id number]                   PATIENT TYPE:  INP   LOCATION:  0460                                 [facility]ACILITY:  [facility]   PHYSICIAN:  Iva Boop, M.D. [facility]           DATE OF BIRTH:  [birth year]   DATE OF ADMISSION:  02/06/2004  DATE OF DISCHARGE:                                HISTORY & PHYSICAL   CHIEF COMPLAINT:  Passing blood after colonoscopy and polypectomy.   HISTORY:  This is a pleasant 55 year old white woman who had a colonoscopy  with hot biopsy removal of two dimunitive rectal polyps on Jan 29, 2004.  Dr.  Lina Sar performed that procedure. She also had an EGD with unremarkable  findings with biopsies of antrum for H. pylori testing and gastroesophageal  junction biopsies. She tells me the polyp biopsies were benign. 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 She noticed  blood streaked stool yesterday and then today had two episodes of bright red  blood with clots into the commode. She has not had dizziness, orthostasis,  or abdominal pain. She did take some Advil today because of menstrual  cramps. However, she clearly had bleeding prior to starting the Advil.   HOME MEDICATIONS:  1. Adderall 40 mg a day.  2. Prozac 60 mg daily.  3. Protonix 40 mg daily.  4. Advil p.r.n.   ALLERGIES:  No known drug allergies.   PAST MEDICAL HISTORY:  1. Obesity.  2. Sleep apnea, uses CPAP.  3. Asthma and bronchitis.  4. Osteoarthritis in her knees.  5. Seasonal allergies.  6. Left knee surgery.  7. Surgery for sleep apnea.   FAMILY HISTORY:  History of heart disease and diabetes.   SOCIAL HISTORY:  Married.  She is college educated. No children.  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 No tobacco. Occasional social alcohol.   REVIEW OF SYSTEMS:  Eyeglasses. She has had a nocturnal cough. Some  perirectal itching. Urinary incontinence. Situational depression. Joint  pain.   PHYSICAL EXAMINATION:  GENERAL: A morbidly obese, white woman in no acute  distress.  VITAL SIGNS: Temperature 99.1, pulse 85, blood pressure 161/90, respirations  18.  HEENT: The eyes are anicteric. The mouth is free of lesions.  NECK: Supple. No thyroid or mass.  CHEST: Clear.  HEART: S1 and S2. No murmurs, rubs, or gallops.  ABDOMEN: Soft, quiet, and nontender without organomegaly or masses. It is  obese.  EXTREMITIES: No edema.  SKIN: Without rash in the areas inspected.  NEUROLOGIC: Alert and oriented times three.   LABORATORY DATA:  Pending.   ASSESSMENT:  Post polypectomy bleeding. This does not sound like  hemorrhoidal bleeding. The timing is after her polypectomy bleeding.  Hopefully, it will  be self limited. She is admitted for observation,  possible transfusion, and possible intervention if she does not stop  bleeding.   I appreciate the opportunity to care for this patient.                                               Iva Boop, M.D. [facility]    CEG/MEDQ  D:  02/06/2004  T:  02/06/2004  Job:  [id number]   cc:   Lina Sar, M.D. [facility]

## 2011-02-13 NOTE — Discharge Summary (Signed)
NAME:  Terri [name], Terri [name]                            ACCOUNT NO.:  [id number]   MEDICAL RECORD NO.:  [id number]                   PATIENT TYPE:  INP   LOCATION:  0451                                 FACILITY:  [facility]   PHYSICIAN:  Wilhemina Bonito. Marina Goodell, M.D. [facility]             DATE OF BIRTH:  09/18/1956   DATE OF ADMISSION:  02/06/2004  DATE OF DISCHARGE:                                 DISCHARGE SUMMARY   ADMITTING DIAGNOSES:  58. A 55 year old white female with acute rectal bleeding status post     colonoscopy and polypectomy on Jan 29, 2004, symptoms most consistent with     a post polypectomy bleed.  2. Mild anemia secondary to above.  3. History of asthmatic bronchitis, osteoarthritis, sleep apnea.   DISCHARGE DIAGNOSES:  1. Resolved postpolypectomy bleed.  2. Mild anemia secondary to above.  3. History of asthmatic bronchitis, osteoarthritis, sleep apnea.   CONSULTATIONS:  None.   PROCEDURES:  None.   BRIEF HISTORY:  Terri [name] is a very nice 55 year old white female known to Dr.  Juanda Chance who underwent colonoscopy with hot biopsy removal of two dimunitive  rectal polyps on Jan 29, 2004.  She also had an upper endoscopy done that  same day with biopsies of the antrum for H. pylori and with history of GERD  had GE junction biopsies as well.  The patient says that she has previously  had trouble with an anal fissure and some external hemorrhoids but had not  had any problems with recent bleeding.  She did fine until the day prior to  admission when she noted blood streaked with her stool and then on the day  of admission had two episodes of bright red blood with clots in the commode.  She had no complaints of abdominal pain, weakness, dizziness, orthostasis,  etc.  Of note, she did take some Advil on the day of admission for menstrual  cramps but had clearly been bleeding prior to using the Advil.  She was seen  and evaluated by Dr. Leone Payor who was covering on call, and admitted to the  hospital on  the evening of Feb 06, 2004 with a post polypectomy bleed for  supportive management.   LABORATORY STUDIES:  On admission, May 11, WBC 7.4, hemoglobin 11.6,  hematocrit of 34.5, MCV of 89.  BMET within normal limits.  BUN was 25,  creatinine 0.7.  Protime 11.9, INR 0.8.  Serial H&H's were obtained.  On the  morning of May 12, hemoglobin 11.4, hematocrit of 33.7; and on the morning  of May 13, hemoglobin 12.5, hematocrit of 36.6.   X-ray studies:  None.   HOSPITAL COURSE:  Ms. Sokolow was admitted to the service of Dr. Stan Head  and then cared for by Dr. Marina Goodell who was covering the hospital.  She was  initially kept at bedrest on a clear liquid diet with IV fluids and serial  H&H's.  She did not manifest any major hemorrhage after admission though she  did have two smaller bloody stools on the morning of May 12.  She had a  stable, benign hospital course.  Her diet was advanced to a low residue diet  on the evening of May 12 and on the morning of May 13 she had not had any  further bloody stools, hemoglobin was actually up to 12.5.  Her abdomen was  soft and benign and she felt well.  She was allowed discharge to home in a  stable condition with instructions to follow up with Dr. Juanda Chance on Feb 15, 2004 at 8:45 a.m. and to call for any problems with recurrent bleeding in  the interim.  She was cautioned to remain off of aspirin, Advil, Aleve, etc.  over the next 2 weeks and to use Tylenol on a p.r.n. basis.  No heavy  lifting or strenuous exercise over the next week.  She will be discharged on  a low residue diet for the next 10-14 days and to resume her usual  medications - Adderall, Prozac, and Protonix as previous.     Amy Esterwood, P.A.-C. [facility]                John N. Marina Goodell, M.D. [facility]    AE/MEDQ  D:  02/08/2004  T:  02/08/2004  Job:  [id number]

## 2011-02-16 ENCOUNTER — Encounter: Payer: Self-pay | Admitting: Internal Medicine

## 2011-03-03 ENCOUNTER — Encounter: Payer: Self-pay | Admitting: Internal Medicine

## 2011-03-03 ENCOUNTER — Ambulatory Visit (AMBULATORY_SURGERY_CENTER): Payer: 59 | Admitting: *Deleted

## 2011-03-03 VITALS — Ht 66.0 in | Wt 225.0 lb

## 2011-03-03 DIAGNOSIS — Z86010 Personal history of colonic polyps: Secondary | ICD-10-CM

## 2011-03-03 MED ORDER — PEG-KCL-NACL-NASULF-NA ASC-C 100 G PO SOLR: ORAL | Status: DC

## 2011-03-17 ENCOUNTER — Other Ambulatory Visit: Payer: Self-pay | Admitting: Internal Medicine

## 2011-04-03 ENCOUNTER — Institutional Professional Consult (permissible substitution): Payer: Self-pay | Admitting: Internal Medicine

## 2011-08-22 ENCOUNTER — Other Ambulatory Visit: Payer: Self-pay | Admitting: Internal Medicine

## 2011-08-31 ENCOUNTER — Other Ambulatory Visit: Payer: Self-pay | Admitting: Family Medicine

## 2011-08-31 DIAGNOSIS — Z1231 Encounter for screening mammogram for malignant neoplasm of breast: Secondary | ICD-10-CM

## 2011-09-18 ENCOUNTER — Ambulatory Visit
Admission: RE | Admit: 2011-09-18 | Discharge: 2011-09-18 | Disposition: A | Discharge status: Home / Self Care | Payer: Self-pay | Source: Ambulatory Visit | Attending: Family Medicine | Admitting: Family Medicine

## 2011-09-18 DIAGNOSIS — Z1231 Encounter for screening mammogram for malignant neoplasm of breast: Secondary | ICD-10-CM

## 2011-11-25 ENCOUNTER — Telehealth (INDEPENDENT_AMBULATORY_CARE_PROVIDER_SITE_OTHER): Payer: Self-pay

## 2011-11-25 NOTE — Telephone Encounter (Signed)
The patient called because she has an upcoming appointment for a band fill.  She was seen by her medical provider and there is a hard area above her pubic bone in the middle.  She has noticed it for 6 months.  She wanted to give [place] a heads up in case we need to order a test before she comes in to check the band position.  She states Dr Ezzard Standing had difficulty locating her port due to her abdominoplasty.  I will inform Dr Ezzard Standing tomorrow to see if we need to order an xray.

## 2011-12-01 ENCOUNTER — Encounter (INDEPENDENT_AMBULATORY_CARE_PROVIDER_SITE_OTHER): Payer: Self-pay | Admitting: Surgery

## 2011-12-01 ENCOUNTER — Ambulatory Visit (INDEPENDENT_AMBULATORY_CARE_PROVIDER_SITE_OTHER): Payer: BC Managed Care – PPO | Admitting: Surgery

## 2011-12-01 DIAGNOSIS — R1909 Other intra-abdominal and pelvic swelling, mass and lump: Secondary | ICD-10-CM

## 2011-12-01 DIAGNOSIS — Z9884 Bariatric surgery status: Secondary | ICD-10-CM

## 2011-12-01 NOTE — Progress Notes (Addendum)
[facility]  Ovidio Kin, MD,  FACS [address] [place]  [address] [place], [place]    [address] Phone:  [phone] FAX:  [phone]   Re:   Terri [name] DOB:   December 03, 1955 MRN:   [id number]  ASSESSMENT AND PLAN: 1.  Lap band - 06/16/1994.  Initial weight - 290, BMI - 46.6.  Last saw Dr. Andrey Campanile in 07/21/2010 for band too tight.  She lost her job at [employer], she now works for the [employer].  I added 0.4 cc to 1.4 cc for a total of 1.8 cc.  She tolerated water.  To stay on liquids for 2 days.  See me back in 4 months for lap band eval.  [CT scan on 12/01/2011 shows a dilated esophagus (the band is too tight) and an anterior abdominal wall fluid collection. By phone, she is going to see me in two weeks to decide UGI vs remove fluid from lap band. DN 12/07/2011] 2.  Suprapubic area mass.  Question hernia.  Will obtain a CT scan.  I suggested she see Dr. Benna Dunks after the CT scan is done.  3.  Abdominoplasty by Dr. Harless Litten in May 2010.  [She reminded me that when she had the abdominoplasty - she was hospitalized post op and got 4 units of blood.  She is going to make an appointment with Dr. Benna Dunks to evaluated the abdominal wall fluid collection. DN 12/07/2011)  HISTORY OF PRESENT ILLNESS: Chief Complaint  Patient presents with  . Lap Band Fill    Terri [name] is a 56 y.o. (DOB: [birth year])  white female who is a patient of Elie Confer, MD, MD and comes to me today for lap band follow up.  She now works for [employer]).  We talked about diet.  She needs to get solids in for breakfast.  She also needs to exercise.  She admits that she is not exercising.  I suggested that she see a dietitian.  She'll think about it.  ROS: Cards: Has seen Dr. Mendel Ryder in the past.  PHYSICAL EXAM: BP 120/82  Pulse 78  Temp(Src) 97.9 F (36.6 C) (Temporal)  Resp 18  Ht 5\' 6"  (1.676 m)  Wt 235 lb (106.595 kg)  BMI 37.93 kg/m2  General: WN WF who  is alert and generally healthy appearing.  HEENT: Normal. Pupils equal. Good dentition. Neck: Supple. No mass.  No thyroid mass.  Carotid pulse okay with no bruit. Lymph Nodes:  No supraclavicular or cervical nodes. Lungs: Clear to auscultation and symmetric breath sounds. Heart:  RRR. No murmur or rub.  Abdomen: Soft. Mass between umbilicus and suprapubic area.  Probable hernia, but does not reduce normally.  No tenderness.  Normal bowel sounds.  No abdominal scars.  The lap band port sits 2 or 3 cm above the umbilicus in the midline.  She has some trouble sitting up since her abdominoplasty. Rectal: Not done. Extremities:  Good strength and ROM  in upper and lower extremities. Neurologic:  Grossly intact to motor and sensory function. Psychiatric: Has normal mood and affect. Behavior is normal.   DATA REVIEWED: Old chart.   Ovidio Kin, MD, FACS Office:  [phone]

## 2011-12-04 ENCOUNTER — Ambulatory Visit
Admission: RE | Admit: 2011-12-04 | Discharge: 2011-12-04 | Disposition: A | Discharge status: Home / Self Care | Payer: BC Managed Care – PPO | Source: Ambulatory Visit | Attending: Surgery | Admitting: Surgery

## 2011-12-04 ENCOUNTER — Ambulatory Visit (INDEPENDENT_AMBULATORY_CARE_PROVIDER_SITE_OTHER): Payer: BC Managed Care – PPO | Admitting: Surgery

## 2011-12-04 ENCOUNTER — Telehealth (INDEPENDENT_AMBULATORY_CARE_PROVIDER_SITE_OTHER): Payer: Self-pay

## 2011-12-04 DIAGNOSIS — Z9884 Bariatric surgery status: Secondary | ICD-10-CM

## 2011-12-04 DIAGNOSIS — R1909 Other intra-abdominal and pelvic swelling, mass and lump: Secondary | ICD-10-CM

## 2011-12-04 MED ORDER — IOHEXOL 300 MG/ML  SOLN
125.0000 mL | Freq: Once | INTRAMUSCULAR | Status: AC | PRN
  Administered 2011-12-04: 125 mL via INTRAVENOUS

## 2011-12-04 NOTE — Telephone Encounter (Signed)
The patient called to report after her band fill.  She is only taking 400 calories in per day.  She takes in tea, shakes, cheese, peanut butter and honey.  She has projectile vomited twice this week one hour after she eats.  She is unable to keep solids down.  She is hiccuping, clearing throat and coughing.  She has a hx of Barrett's.  I advised her to stay on liquids for now.  I spoke to Dr Ezzard Standing who wants her on liquids through the weekend and see [place] next week.  If she is doing well, cancel the appt.  I gave her an appt for Wednesday.

## 2011-12-04 NOTE — Progress Notes (Signed)
Mrs. Iwai return to the urgent office with symptoms of nocturnal reflux and being too tight as well as on the CT scan having fluid-filled space and her pannus. She's had a previous tummy tuck and referable to that her port site is now just above her neo-navel.  I aspirated 1.5 cc total in her band and put back 1 cc. She was able to swallow better. She will see Dr. Ezzard Standing next week and they will address the fluid seen in the CT scan it is down in her pannus. This is in the anterior abdominal wall and it is 3.8 x 21 x 12 cm.  Return next week

## 2011-12-08 ENCOUNTER — Telehealth (INDEPENDENT_AMBULATORY_CARE_PROVIDER_SITE_OTHER): Payer: Self-pay | Admitting: Surgery

## 2011-12-08 ENCOUNTER — Other Ambulatory Visit: Payer: Self-pay | Admitting: Internal Medicine

## 2011-12-08 NOTE — Telephone Encounter (Signed)
I made the pt an appt for 3/27 and notified the pt.

## 2011-12-09 ENCOUNTER — Encounter (INDEPENDENT_AMBULATORY_CARE_PROVIDER_SITE_OTHER): Payer: BC Managed Care – PPO | Admitting: Surgery

## 2011-12-10 ENCOUNTER — Ambulatory Visit (HOSPITAL_COMMUNITY)
Admission: RE | Admit: 2011-12-10 | Discharge: 2011-12-10 | Disposition: A | Discharge status: Home / Self Care | Payer: BC Managed Care – PPO | Source: Ambulatory Visit | Attending: Plastic Surgery | Admitting: Plastic Surgery

## 2011-12-10 ENCOUNTER — Telehealth: Payer: Self-pay | Admitting: Internal Medicine

## 2011-12-10 ENCOUNTER — Telehealth (INDEPENDENT_AMBULATORY_CARE_PROVIDER_SITE_OTHER): Payer: Self-pay

## 2011-12-10 ENCOUNTER — Other Ambulatory Visit (HOSPITAL_COMMUNITY): Payer: Self-pay | Admitting: Plastic Surgery

## 2011-12-10 DIAGNOSIS — IMO0002 Reserved for concepts with insufficient information to code with codable children: Secondary | ICD-10-CM

## 2011-12-10 DIAGNOSIS — Z9884 Bariatric surgery status: Secondary | ICD-10-CM | POA: Insufficient documentation

## 2011-12-10 DIAGNOSIS — R188 Other ascites: Secondary | ICD-10-CM | POA: Insufficient documentation

## 2011-12-10 MED ORDER — RABEPRAZOLE SODIUM 20 MG PO TBEC: 20.0000 mg | DELAYED_RELEASE_TABLET | Freq: Two times a day (BID) | ORAL | Status: DC

## 2011-12-10 NOTE — Telephone Encounter (Signed)
rx sent. Patient needs to have colonoscopy!

## 2011-12-10 NOTE — Procedures (Signed)
Ultrasound guided drainage of subcutaneous hematoma.  Removed 300 ml of dark bloody fluid.  Fluid collection was completely aspirated.  Drain removed at end of procedure. No immediate complication.

## 2011-12-10 NOTE — Telephone Encounter (Signed)
Patient called after IR removed a hematoma - 300cc by Dr. Lowella Dandy at [facility]. Patient called to report she doing well just tired.

## 2011-12-23 ENCOUNTER — Ambulatory Visit (INDEPENDENT_AMBULATORY_CARE_PROVIDER_SITE_OTHER): Payer: BC Managed Care – PPO | Admitting: Surgery

## 2011-12-23 ENCOUNTER — Encounter (INDEPENDENT_AMBULATORY_CARE_PROVIDER_SITE_OTHER): Payer: Self-pay | Admitting: Surgery

## 2011-12-23 VITALS — BP 124/82 | HR 92 | Temp 97.8°F | Resp 16 | Ht 66.0 in | Wt 233.0 lb

## 2011-12-23 DIAGNOSIS — Z9884 Bariatric surgery status: Secondary | ICD-10-CM

## 2011-12-23 NOTE — Progress Notes (Addendum)
[facility]  Ovidio Kin, MD,  FACS [address] [place]  [address] [place], [place]    [address] Phone:  [phone] FAX:  [phone]   Re:   Terri [name] DOB:   [birth year] MRN:   [id number]  ASSESSMENT AND PLAN: 1.  Lap band - 06/16/1994.  Initial weight - 290, BMI - 46.6.  Last saw Dr. Andrey Campanile in 07/21/2010 for band too tight.  She lost her job at [employer], she now works for the "[employer]  Dr. Daphine Deutscher adjuster her lap band, after I filled her lap band, on 12/04/2011 - and she is doing better with her symptoms.  We agreed to give her about 3 1/2 months.  Then get an UGI/barium swallow.  I will see her about two weeks after the study and decide is the lap band okay or does she need a band holiday.  See me back in 4 months for lap band eval.  2.  Suprapubic area mass.  Question hernia.  CT scan -  Shows a dilated esophagus (the band is too tight) and an anterior abdominal wall fluid collection.  She had 300 cc aspirated from the suprapubic mass (old hematoma).  [Dr. Harless Litten called me about the patient.  She apparently has had recurrence of this fluid collection.  She plans to talk to me about it next visit.  DN 03/30/2012]  3.  Abdominoplasty by Dr. Harless Litten in May 2010.  When she had the abdominoplasty - she was hospitalized post op and got 4 units of blood.    She is seeing Dr. Benna Dunks back today.  4. CT scan on 12/01/2011 shows a dilated esophagus (the band is too tight?).  But she is doing much better with her symptoms.  I will get an UGI in 3 1/2 months.  HISTORY OF PRESENT ILLNESS: Chief Complaint  Patient presents with  . Follow-up    lap band    Terri [name] is a 56 y.o. (DOB: [birth year])  white female who is a patient of Elie Confer, MD, MD and comes to me today for lap band follow up.  She now works for [employer]).  We talked about diet.    Her reflux is better since her lap band has been adjusted.  We talked about  options of treatment and my concern about her dilated esophagus.  Since she is much better I am going to give her 3 1/2 months, then get an UGI, and then decide whether she needs a band holiday or not.  ROS: Cards: Has seen Dr. Mendel Ryder in the past.  PHYSICAL EXAM: BP 124/82  Pulse 92  Temp(Src) 97.8 F (36.6 C) (Temporal)  Resp 16  Ht 5\' 6"  (1.676 m)  Wt 233 lb (105.688 kg)  BMI 37.61 kg/m2  General: WN WF who is alert and generally healthy appearing.   Abdomen: Soft. Mass between umbilicus and suprapubic area.  Probable hernia, but does not reduce normally.  No tenderness.  Normal bowel sounds.  No abdominal scars.  The lap band port sits 2 or 3 cm above the umbilicus in the midline.  The suprapubic bulge is better than when I last saw her.  DATA REVIEWED: Old chart.  Ovidio Kin, MD, FACS Office:  [phone]

## 2012-01-25 ENCOUNTER — Encounter (INDEPENDENT_AMBULATORY_CARE_PROVIDER_SITE_OTHER): Payer: Self-pay

## 2012-01-25 ENCOUNTER — Other Ambulatory Visit (INDEPENDENT_AMBULATORY_CARE_PROVIDER_SITE_OTHER): Payer: Self-pay

## 2012-01-25 DIAGNOSIS — Z9884 Bariatric surgery status: Secondary | ICD-10-CM

## 2012-01-25 DIAGNOSIS — K2289 Other specified disease of esophagus: Secondary | ICD-10-CM

## 2012-01-25 DIAGNOSIS — K228 Other specified diseases of esophagus: Secondary | ICD-10-CM

## 2012-02-15 ENCOUNTER — Encounter: Payer: Self-pay | Admitting: Internal Medicine

## 2012-03-24 ENCOUNTER — Ambulatory Visit
Admission: RE | Admit: 2012-03-24 | Discharge: 2012-03-24 | Disposition: A | Discharge status: Home / Self Care | Payer: BC Managed Care – PPO | Source: Ambulatory Visit | Attending: Surgery | Admitting: Surgery

## 2012-03-24 DIAGNOSIS — Z9884 Bariatric surgery status: Secondary | ICD-10-CM

## 2012-04-01 ENCOUNTER — Telehealth (INDEPENDENT_AMBULATORY_CARE_PROVIDER_SITE_OTHER): Payer: Self-pay

## 2012-04-01 NOTE — Telephone Encounter (Signed)
The patient called today because for a week she has had an achy pain on her rt side near the lower rib.  There is swelling and protruding.  It is uncomfortable like she pulled a muscle.   She had this fluid collection in her pelvis that Dr Benna Dunks drew fluid off several times.  He thinks it is a seroma.  He recommends surgery to go in her tummy tuck scar and see where the source of this fluid is.  The pt wants Dr Ezzard Standing to look at her recent ct and see if there was anything on the right side that would explain the pain she has now.    She has just had her UGI done.  She was told the lap band looks ok but fluid is pooling in the esophagus.  She has some burning when she eats.  She vomits twice a week.  She has not felt like exercising and has not lost any weight.  She feels she has not been eating to gain weight.    She has an appointment to come in July 18th but wants a sooner one if possible.  I told her i would have to talk to Dr Ezzard Standing Monday and call her back

## 2012-04-04 NOTE — Telephone Encounter (Signed)
I spoke to Dr Ezzard Standing today and he states there is nothing on the ct to explain her right sided pain.  He says her band is too tight.  He will see her to discuss the band and the fluid collection.  We can call her in sooner if we get a cancellation.  I left the pt a vm

## 2012-04-14 ENCOUNTER — Encounter (INDEPENDENT_AMBULATORY_CARE_PROVIDER_SITE_OTHER): Payer: Self-pay | Admitting: Surgery

## 2012-04-14 ENCOUNTER — Ambulatory Visit (INDEPENDENT_AMBULATORY_CARE_PROVIDER_SITE_OTHER): Payer: BC Managed Care – PPO | Admitting: Surgery

## 2012-04-14 VITALS — BP 120/82 | HR 84 | Resp 12 | Ht 66.0 in | Wt 237.4 lb

## 2012-04-14 DIAGNOSIS — IMO0002 Reserved for concepts with insufficient information to code with codable children: Secondary | ICD-10-CM

## 2012-04-14 DIAGNOSIS — Z9884 Bariatric surgery status: Secondary | ICD-10-CM

## 2012-04-14 NOTE — Progress Notes (Addendum)
[facility]  Ovidio Kin, MD,  FACS [address] [place]  [address] [place], [place]    [address] Phone:  [phone] FAX:  [phone]   Re:   Terri [name] DOB:   June 02, 1956 MRN:   [id number]  ASSESSMENT AND PLAN: 1.  Lap band - 06/16/2004.  Initial weight - 290, BMI - 46.6.  She lost her job at [employer], she now works for the "[employer].  UGI  - 03/24/2012 - shows dilated esophagus with poor emptying.  I think her band is too tight, though she has only vomited 4 x in last 3 months.  I removed the remaining fluid in the lap band today - approx 1.5 cc.  This is a smaller amount than I expected, so I hope that esophagus returns to normal.  We will give her a 3 month band holiday.  2.  Suprapubic abdominal mass - chronic abdominal wall seroma - 12 x 27 x 2.5 cm by my US exam.  This coincides with the CT measurement on 12/04/2011.  Dr. Harless Litten called me about the patient on 03/30/2012 and we discussed the seroma.  I discussed three options with Terri [name]:  1.) Leave the seroma alone or aspirate as necessary, 2.)  Do an open operation to try to excise the entire "sac" of the seroma cavity in hopes it does not reform, 3.) Explore the seroma cavity laparoscopically, debride it as best I can, and leave drains in for 4+ weeks.  The patient is having enough discomfort that she wants "something done".  We will start with the laparoscopic exploration and long term drain placement.  I talked about the limits of surgery and the risks of recurrence.  I told her I have done this laparoscopic exploration method before, but not for an abdominoplasty.  She understands the plan and risks. She will also check with her insurance about coverage.  3.  Abdominoplasty by Dr. Harless Litten in May 2010.  When she had the abdominoplasty - she was hospitalized post op and got 4 units of blood.    4.  Dilated esophagus.  Secondary to band too tight.  See #1.  5.  Right subcostal pain.  This has been  going on about 3 weeks.  It seems musculoskeletal in nature, though she said that she has not had any trauma.  If it is persistent, we could do a [place] of her GB, though this does not seem biliary in nature.  Her CT scan in March 2013 did not show anything, but it pre-dated her symptoms. 6.  Obst Sleep Apnea - on CPAP  Followed by Dr. Doreatha Massed.  HISTORY OF PRESENT ILLNESS: Chief Complaint  Patient presents with  . Lap Band Fill  . Abdominal Pain    Terri [name] is a 56 y.o. (DOB: 1956-08-10)  white female who is a patient of Terri Confer, MD and comes to me today for lap band follow up and an abdominal seroma.   She now works for [employer]).    She comes with three concerns: 1.) I reviewed her UGI (03/24/2012) with her.  It does show a dilated poorly emptying esophagus, though the lap band looks okay.  She has only vomited about 4 times in the last three months and her reflux is better.  But I think she needs a lap band holiday.  Then we will consider upper endo or repeat UGI or both. 2.)  The seroma in her lower  abdomen has been drained three times, but keeps coming back.  Dr. Benna Dunks called me.  I discussed with her my thoughts and options.  (See my assessment and plan #2)  I reviewed with her the options that I see for surgery.  I spent a good amount of time going over these. 3.)  RUQ/subcostal pain.  This seems musculoskeletal by her description of pain on reaching and twisting.  She gives no history of trauma.  For now, we will give this time.  ROS: Cards: Has seen Dr. Mendel Ryder in the past.  PHYSICAL EXAM: BP 120/82  Pulse 84  Resp 12  Ht 5\' 6"  (1.676 m)  Wt 237 lb 6 oz (107.673 kg)  BMI 38.31 kg/m2  General: WN WF who is alert and generally healthy appearing.  Lungs:  Clear. Heart:  RRR, no murmur. Abdomen: Soft. Mass between umbilicus and suprapubic area, but it is not very distinct. No tenderness or redness.  Normal bowel sounds.    The lap band port  sits about 3 cm above the umbilicus in the midline.    Procedure:  I removed all fluid from the lap band - about 1.5 cc.  Abdominal wall [place]:  12 x 27 x 2.5 cm hypoechoic collection, c/w seroma.  It does look approachable for laparoscopic trocars.  DATA REVIEWED: Old chart.  Ovidio Kin, MD, FACS Office:  [phone]

## 2012-04-14 NOTE — Patient Instructions (Addendum)
Laparoscopic/endoscopic exploration/ debridement of anterior abdominal seroma with placement of drains

## 2012-04-20 ENCOUNTER — Telehealth (INDEPENDENT_AMBULATORY_CARE_PROVIDER_SITE_OTHER): Payer: Self-pay

## 2012-04-20 NOTE — Telephone Encounter (Signed)
The pt called to question how long her recovery will be.  When she talked to another nurse, it looked like she was posted for a colon resection.  I looked in to why it said that and the surgery scheduler was told by the hospital to use that as a heading.  They did not have her particular surgery in there so the scheduler typed her surgery up under there.  I told the pt I would plan on 2 weeks recovery but she will probably have a drain in and we will follow her for that.

## 2012-05-03 ENCOUNTER — Telehealth (INDEPENDENT_AMBULATORY_CARE_PROVIDER_SITE_OTHER): Payer: Self-pay

## 2012-05-03 NOTE — Telephone Encounter (Signed)
The patient called in regarding her seroma and wonders if she needs to have it aspirated preop.  I paged Dr Ezzard Standing and he says he wants it distended at surgery.  I notified the pt.  She had also asked if she needed to see him preop.   I told her it wasn't necessary unless she needed to talk to him and she doesn't.

## 2012-05-04 ENCOUNTER — Other Ambulatory Visit: Payer: Self-pay | Admitting: Internal Medicine

## 2012-05-10 ENCOUNTER — Telehealth (INDEPENDENT_AMBULATORY_CARE_PROVIDER_SITE_OTHER): Payer: Self-pay

## 2012-05-10 NOTE — Telephone Encounter (Signed)
The patient called with a few questions.  She wanted to know how long the surgery will take and what time to tell her friend to show up the day of surgery because her husband can't be there.  I told her the surgery is set for a hour and a half but she should have someone there the whole time.  She also wondered what time she will be discharged the next day.  I told her that depends on how she is doing, when Dr Ezzard Standing comes by and then how quick the nurse gets her out of there.  She needs to know what time to tell her sister to pick her up.  She also wants to know if she can get her pain med for after surgery ahead of time.  She wants Roxicet because it worked after her lap band surgery and it's liquid.  I will discuss these questions with Dr Ezzard Standing.

## 2012-05-11 NOTE — Telephone Encounter (Signed)
I called the pt to let her know that Dr Ezzard Standing wrote for her Roxicet Elixir 200cc 5-10 cc po q 6hrs prn pain.  She will pick up the rx.

## 2012-05-13 ENCOUNTER — Encounter (HOSPITAL_COMMUNITY): Payer: Self-pay | Admitting: Pharmacy Technician

## 2012-05-19 NOTE — Pre-Procedure Instructions (Signed)
20 Terri [name]  05/19/2012   Your procedure is scheduled on: Thursday May 26, 2012  Report to [facility] at 5:30 AM.  Call this number if you have problems the morning of surgery: [phone]   Remember:   Do not eat food or drink:After Midnight.      Take these medicines the morning of surgery with A SIP OF WATER: xanax, symbicort, prozac, aciphex, (DISCONTINUE ASPIRIN, COUMADIN, PLAVIX, EFFIENT, AND HERBAL MEDICATIONS)    Do not wear jewelry, make-up or nail polish.  Do not wear lotions, powders, or perfumes. You may wear deodorant.  Do not shave 48 hours prior to surgery. Men may shave face and neck.  Do not bring valuables to the hospital.  Contacts, dentures or bridgework may not be worn into surgery.  Leave suitcase in the car. After surgery it may be brought to your room.  For patients admitted to the hospital, checkout time is 11:00 AM the day of discharge.   Patients discharged the day of surgery will not be allowed to drive home.  Name and phone number of your driver: FAMILY / FRIEND  Special Instructions: CHG Shower Use Special Wash: 1/2 bottle night before surgery and 1/2 bottle morning of surgery.   Please read over the following fact sheets that you were given: Pain Booklet, Coughing and Deep Breathing, MRSA Information and Surgical Site Infection Prevention

## 2012-05-20 ENCOUNTER — Encounter (HOSPITAL_COMMUNITY)
Admission: RE | Admit: 2012-05-20 | Discharge: 2012-05-20 | Disposition: A | Discharge status: Home / Self Care | Payer: BC Managed Care – PPO | Source: Ambulatory Visit | Attending: Surgery | Admitting: Surgery

## 2012-05-20 ENCOUNTER — Encounter (HOSPITAL_COMMUNITY): Payer: Self-pay

## 2012-05-20 HISTORY — DX: Other complications of anesthesia, initial encounter: T88.59XA

## 2012-05-20 HISTORY — DX: Adverse effect of unspecified anesthetic, initial encounter: T41.45XA

## 2012-05-20 LAB — CBC
HCT: 37.7 % (ref 36.0–46.0)
Hemoglobin: 12.7 g/dL (ref 12.0–15.0)
MCH: 30.3 pg (ref 26.0–34.0)
MCHC: 33.7 g/dL (ref 30.0–36.0)
MCV: 90 fL (ref 78.0–100.0)
Platelets: 271 10*3/uL (ref 150–400)
RBC: 4.19 MIL/uL (ref 3.87–5.11)
RDW: 13.2 % (ref 11.5–15.5)
WBC: 8.3 10*3/uL (ref 4.0–10.5)

## 2012-05-20 LAB — HCG, SERUM, QUALITATIVE: Preg, Serum: NEGATIVE

## 2012-05-20 LAB — SURGICAL PCR SCREEN
MRSA, PCR: NEGATIVE
Staphylococcus aureus: NEGATIVE

## 2012-05-20 NOTE — Progress Notes (Signed)
Requested stress test, last OV note from Dr. Garnette Scheuermann at [facility].   Spoke with Britta Mccreedy in Dr. Allene Pyo office, requested new orders as others are more than 33 days old.

## 2012-05-23 ENCOUNTER — Other Ambulatory Visit (INDEPENDENT_AMBULATORY_CARE_PROVIDER_SITE_OTHER): Payer: Self-pay | Admitting: Surgery

## 2012-05-25 MED ORDER — CEFAZOLIN SODIUM-DEXTROSE 2-3 GM-% IV SOLR
2.0000 g | INTRAVENOUS | Status: AC
  Administered 2012-05-26: 2 g via INTRAVENOUS
  Filled 2012-05-25: qty 50

## 2012-05-26 ENCOUNTER — Encounter (HOSPITAL_COMMUNITY)
Admission: RE | Disposition: A | Discharge status: Home / Self Care | Payer: Self-pay | Source: Ambulatory Visit | Attending: Surgery

## 2012-05-26 ENCOUNTER — Encounter (HOSPITAL_COMMUNITY): Payer: Self-pay | Admitting: Anesthesiology

## 2012-05-26 ENCOUNTER — Ambulatory Visit (HOSPITAL_COMMUNITY)
Admission: RE | Admit: 2012-05-26 | Discharge: 2012-05-27 | Disposition: A | Discharge status: Home / Self Care | Payer: BC Managed Care – PPO | Source: Ambulatory Visit | Attending: Surgery | Admitting: Surgery

## 2012-05-26 ENCOUNTER — Ambulatory Visit (HOSPITAL_COMMUNITY): Payer: BC Managed Care – PPO | Admitting: Anesthesiology

## 2012-05-26 ENCOUNTER — Encounter (HOSPITAL_COMMUNITY): Payer: Self-pay | Admitting: General Practice

## 2012-05-26 DIAGNOSIS — Z9884 Bariatric surgery status: Secondary | ICD-10-CM | POA: Insufficient documentation

## 2012-05-26 DIAGNOSIS — IMO0002 Reserved for concepts with insufficient information to code with codable children: Secondary | ICD-10-CM

## 2012-05-26 DIAGNOSIS — Y838 Other surgical procedures as the cause of abnormal reaction of the patient, or of later complication, without mention of misadventure at the time of the procedure: Secondary | ICD-10-CM | POA: Insufficient documentation

## 2012-05-26 DIAGNOSIS — Z01818 Encounter for other preprocedural examination: Secondary | ICD-10-CM | POA: Insufficient documentation

## 2012-05-26 DIAGNOSIS — Z01812 Encounter for preprocedural laboratory examination: Secondary | ICD-10-CM | POA: Insufficient documentation

## 2012-05-26 HISTORY — DX: Anemia, unspecified: D64.9

## 2012-05-26 HISTORY — PX: ABDOMINAL DEBRIDEMENT: SHX1109

## 2012-05-26 SURGERY — EXPLORATION, ABDOMEN, LAPAROSCOPIC
Anesthesia: General | Site: Abdomen | Wound class: Clean

## 2012-05-26 MED ORDER — SODIUM CHLORIDE 0.9 % IR SOLN
Status: DC | PRN
  Administered 2012-05-26 (×2): 1

## 2012-05-26 MED ORDER — MORPHINE SULFATE 2 MG/ML IJ SOLN
INTRAMUSCULAR | Status: AC
  Filled 2012-05-26: qty 1

## 2012-05-26 MED ORDER — CEFAZOLIN SODIUM-DEXTROSE 2-3 GM-% IV SOLR: 2.0000 g | INTRAVENOUS | Status: DC

## 2012-05-26 MED ORDER — ALPRAZOLAM 0.5 MG PO TABS: 0.5000 mg | ORAL_TABLET | Freq: Every day | ORAL | Status: DC | PRN

## 2012-05-26 MED ORDER — ONDANSETRON HCL 4 MG PO TABS: 4.0000 mg | ORAL_TABLET | Freq: Four times a day (QID) | ORAL | Status: DC | PRN

## 2012-05-26 MED ORDER — PANTOPRAZOLE SODIUM 40 MG PO TBEC
40.0000 mg | DELAYED_RELEASE_TABLET | Freq: Every day | ORAL | Status: DC
  Administered 2012-05-26: 40 mg via ORAL
  Filled 2012-05-26: qty 1

## 2012-05-26 MED ORDER — CHLORHEXIDINE GLUCONATE 4 % EX LIQD: 1.0000 "application " | Freq: Once | CUTANEOUS | Status: DC

## 2012-05-26 MED ORDER — BUPIVACAINE HCL (PF) 0.25 % IJ SOLN
INTRAMUSCULAR | Status: DC | PRN
  Administered 2012-05-26: 10 mL

## 2012-05-26 MED ORDER — HYDROCODONE-ACETAMINOPHEN 5-325 MG PO TABS
1.0000 | ORAL_TABLET | ORAL | Status: DC | PRN
  Administered 2012-05-26 – 2012-05-27 (×2): 2 via ORAL
  Filled 2012-05-26 (×2): qty 2

## 2012-05-26 MED ORDER — SODIUM CHLORIDE 0.9 % IR SOLN
Status: DC | PRN
  Administered 2012-05-26: 1

## 2012-05-26 MED ORDER — SODIUM CHLORIDE 0.9 % IR SOLN
Status: DC | PRN
  Administered 2012-05-26: 3000 mL

## 2012-05-26 MED ORDER — ONDANSETRON HCL 4 MG/2ML IJ SOLN
INTRAMUSCULAR | Status: DC | PRN
  Administered 2012-05-26: 4 mg via INTRAVENOUS

## 2012-05-26 MED ORDER — ROCURONIUM BROMIDE 100 MG/10ML IV SOLN
INTRAVENOUS | Status: DC | PRN
  Administered 2012-05-26: 10 mg via INTRAVENOUS
  Administered 2012-05-26: 30 mg via INTRAVENOUS

## 2012-05-26 MED ORDER — LORATADINE 10 MG PO TABS
10.0000 mg | ORAL_TABLET | Freq: Every evening | ORAL | Status: DC
  Administered 2012-05-26: 10 mg via ORAL
  Filled 2012-05-26 (×2): qty 1

## 2012-05-26 MED ORDER — LEVOCETIRIZINE DIHYDROCHLORIDE 5 MG PO TABS: 2.5000 mg | ORAL_TABLET | Freq: Every evening | ORAL | Status: DC

## 2012-05-26 MED ORDER — SUFENTANIL CITRATE 50 MCG/ML IV SOLN
INTRAVENOUS | Status: DC | PRN
  Administered 2012-05-26 (×2): 10 ug via INTRAVENOUS
  Administered 2012-05-26: 20 ug via INTRAVENOUS

## 2012-05-26 MED ORDER — SUCCINYLCHOLINE CHLORIDE 20 MG/ML IJ SOLN
INTRAMUSCULAR | Status: DC | PRN
  Administered 2012-05-26: 120 mg via INTRAVENOUS

## 2012-05-26 MED ORDER — BUDESONIDE-FORMOTEROL FUMARATE 160-4.5 MCG/ACT IN AERO
2.0000 | INHALATION_SPRAY | Freq: Two times a day (BID) | RESPIRATORY_TRACT | Status: DC | PRN
  Filled 2012-05-26: qty 6

## 2012-05-26 MED ORDER — HYDROMORPHONE HCL PF 1 MG/ML IJ SOLN
INTRAMUSCULAR | Status: AC
  Filled 2012-05-26: qty 1

## 2012-05-26 MED ORDER — KCL IN DEXTROSE-NACL 20-5-0.45 MEQ/L-%-% IV SOLN
INTRAVENOUS | Status: DC
  Administered 2012-05-26 – 2012-05-27 (×2): via INTRAVENOUS
  Filled 2012-05-26 (×3): qty 1000

## 2012-05-26 MED ORDER — BUPIVACAINE HCL (PF) 0.25 % IJ SOLN
INTRAMUSCULAR | Status: AC
  Filled 2012-05-26: qty 30

## 2012-05-26 MED ORDER — LACTATED RINGERS IV SOLN
INTRAVENOUS | Status: DC | PRN
  Administered 2012-05-26: 07:00:00 via INTRAVENOUS

## 2012-05-26 MED ORDER — MIDAZOLAM HCL 5 MG/5ML IJ SOLN
INTRAMUSCULAR | Status: DC | PRN
  Administered 2012-05-26: 2 mg via INTRAVENOUS

## 2012-05-26 MED ORDER — HYDROMORPHONE HCL PF 1 MG/ML IJ SOLN
0.2500 mg | INTRAMUSCULAR | Status: DC | PRN
  Administered 2012-05-26 (×4): 0.5 mg via INTRAVENOUS

## 2012-05-26 MED ORDER — KCL IN DEXTROSE-NACL 20-5-0.45 MEQ/L-%-% IV SOLN
INTRAVENOUS | Status: AC
  Filled 2012-05-26: qty 1000

## 2012-05-26 MED ORDER — MORPHINE SULFATE 2 MG/ML IJ SOLN
1.0000 mg | INTRAMUSCULAR | Status: DC | PRN
  Administered 2012-05-26: 2 mg via INTRAVENOUS

## 2012-05-26 MED ORDER — LIDOCAINE HCL (CARDIAC) 20 MG/ML IV SOLN
INTRAVENOUS | Status: DC | PRN
  Administered 2012-05-26: 100 mg via INTRAVENOUS

## 2012-05-26 MED ORDER — HEMOSTATIC AGENTS (NO CHARGE) OPTIME
TOPICAL | Status: DC | PRN
  Administered 2012-05-26: 2

## 2012-05-26 MED ORDER — FLUOXETINE HCL 20 MG PO CAPS
20.0000 mg | ORAL_CAPSULE | Freq: Every day | ORAL | Status: DC
  Administered 2012-05-26: 20 mg via ORAL
  Filled 2012-05-26 (×2): qty 1

## 2012-05-26 MED ORDER — MEPERIDINE HCL 25 MG/ML IJ SOLN: 6.2500 mg | INTRAMUSCULAR | Status: DC | PRN

## 2012-05-26 MED ORDER — ONDANSETRON HCL 4 MG/2ML IJ SOLN: 4.0000 mg | Freq: Four times a day (QID) | INTRAMUSCULAR | Status: DC | PRN

## 2012-05-26 MED ORDER — PROPOFOL 10 MG/ML IV BOLUS
INTRAVENOUS | Status: DC | PRN
Start: 2012-05-26 — End: 2012-05-26
  Administered 2012-05-26: 150 mg via INTRAVENOUS

## 2012-05-26 MED ORDER — ZOLPIDEM TARTRATE 5 MG PO TABS
10.0000 mg | ORAL_TABLET | Freq: Every evening | ORAL | Status: DC | PRN
  Administered 2012-05-26: 10 mg via ORAL
  Filled 2012-05-26: qty 2

## 2012-05-26 MED ORDER — ONDANSETRON HCL 4 MG/2ML IJ SOLN: 4.0000 mg | Freq: Once | INTRAMUSCULAR | Status: DC | PRN

## 2012-05-26 MED ORDER — BUPIVACAINE HCL (PF) 0.25 % IJ SOLN: INTRAMUSCULAR | Status: DC | PRN

## 2012-05-26 SURGICAL SUPPLY — 82 items
APPLIER CLIP ROT 10 11.4 M/L (STAPLE)
BINDER ABD UNIV 12 45-62 (WOUND CARE) ×1 IMPLANT
BINDER ABDOMINAL 46IN 62IN (WOUND CARE) ×2
BLADE SURG 10 STRL SS (BLADE) ×2 IMPLANT
BLADE SURG ROTATE 9660 (MISCELLANEOUS) ×2 IMPLANT
CANISTER SUCTION 2500CC (MISCELLANEOUS) ×2 IMPLANT
CELLS DAT CNTRL 66122 CELL SVR (MISCELLANEOUS) IMPLANT
CHLORAPREP W/TINT 26ML (MISCELLANEOUS) ×2 IMPLANT
CLIP APPLIE ROT 10 11.4 M/L (STAPLE) IMPLANT
CLOTH BEACON ORANGE TIMEOUT ST (SAFETY) ×2 IMPLANT
COVER SURGICAL LIGHT HANDLE (MISCELLANEOUS) ×2 IMPLANT
COVER TRANSDUCER ULTRASND GEL (DRAPE) ×2 IMPLANT
DERMABOND ADVANCED (GAUZE/BANDAGES/DRESSINGS) ×1
DERMABOND ADVANCED .7 DNX12 (GAUZE/BANDAGES/DRESSINGS) ×1 IMPLANT
DRAIN CHANNEL 19F RND (DRAIN) ×4 IMPLANT
DRAPE PROXIMA HALF (DRAPES) ×2 IMPLANT
DRAPE UTILITY 15X26 W/TAPE STR (DRAPE) ×4 IMPLANT
DRAPE WARM FLUID 44X44 (DRAPE) ×2 IMPLANT
DRSG PAD ABDOMINAL 8X10 ST (GAUZE/BANDAGES/DRESSINGS) ×4 IMPLANT
ELECT CAUTERY BLADE 6.4 (BLADE) ×2 IMPLANT
ELECT REM PT RETURN 9FT ADLT (ELECTROSURGICAL) ×2
ELECTRODE REM PT RTRN 9FT ADLT (ELECTROSURGICAL) ×1 IMPLANT
EVACUATOR SILICONE 100CC (DRAIN) ×4 IMPLANT
GEL ULTRASOUND 20GR AQUASONIC (MISCELLANEOUS) ×2 IMPLANT
GLOVE BIOGEL PI IND STRL 6.5 (GLOVE) ×1 IMPLANT
GLOVE BIOGEL PI IND STRL 7.0 (GLOVE) ×2 IMPLANT
GLOVE BIOGEL PI INDICATOR 6.5 (GLOVE) ×1
GLOVE BIOGEL PI INDICATOR 7.0 (GLOVE) ×2
GLOVE ECLIPSE 6.5 STRL STRAW (GLOVE) ×4 IMPLANT
GLOVE SURG SIGNA 7.5 PF LTX (GLOVE) ×2 IMPLANT
GLOVE SURG SS PI 6.5 STRL IVOR (GLOVE) ×4 IMPLANT
GOWN STRL NON-REIN LRG LVL3 (GOWN DISPOSABLE) ×6 IMPLANT
GOWN STRL REIN XL XLG (GOWN DISPOSABLE) ×2 IMPLANT
IV CATH 14GX2 1/4 (CATHETERS) ×2 IMPLANT
KIT BASIN OR (CUSTOM PROCEDURE TRAY) ×2 IMPLANT
KIT ROOM TURNOVER OR (KITS) ×2 IMPLANT
LEGGING LITHOTOMY PAIR STRL (DRAPES) IMPLANT
LIGASURE IMPACT 36 18CM CVD LR (INSTRUMENTS) IMPLANT
NS IRRIG 1000ML POUR BTL (IV SOLUTION) ×4 IMPLANT
PAD ARMBOARD 7.5X6 YLW CONV (MISCELLANEOUS) ×2 IMPLANT
PENCIL BUTTON HOLSTER BLD 10FT (ELECTRODE) ×2 IMPLANT
RTRCTR WOUND ALEXIS 18CM MED (MISCELLANEOUS)
SCALPEL HARMONIC ACE (MISCELLANEOUS) IMPLANT
SCISSORS LAP 5X35 DISP (ENDOMECHANICALS) IMPLANT
SET IRRIG TUBING LAPAROSCOPIC (IRRIGATION / IRRIGATOR) ×2 IMPLANT
SLEEVE ADV FIXATION 5X100MM (TROCAR) ×2 IMPLANT
SLEEVE ENDOPATH XCEL 5M (ENDOMECHANICALS) ×2 IMPLANT
SPECIMEN JAR LARGE (MISCELLANEOUS) IMPLANT
SPONGE GAUZE 4X4 12PLY (GAUZE/BANDAGES/DRESSINGS) IMPLANT
SPONGE LAP 18X18 X RAY DECT (DISPOSABLE) ×2 IMPLANT
STAPLER VISISTAT 35W (STAPLE) ×2 IMPLANT
STOPCOCK 4 WAY LG BORE MALE ST (IV SETS) ×2 IMPLANT
SURGILUBE 2OZ TUBE FLIPTOP (MISCELLANEOUS) IMPLANT
SUT ETHILON 2 0 FS 18 (SUTURE) ×2 IMPLANT
SUT MON AB 5-0 PS2 18 (SUTURE) ×2 IMPLANT
SUT PDS AB 1 CT  36 (SUTURE)
SUT PDS AB 1 CT 36 (SUTURE) IMPLANT
SUT PDS AB 1 CTX 36 (SUTURE) IMPLANT
SUT PROLENE 2 0 CT2 30 (SUTURE) IMPLANT
SUT PROLENE 2 0 KS (SUTURE) IMPLANT
SUT SILK 2 0 (SUTURE) ×1
SUT SILK 2 0 SH CR/8 (SUTURE) ×2 IMPLANT
SUT SILK 2-0 18XBRD TIE 12 (SUTURE) ×1 IMPLANT
SUT SILK 3 0 (SUTURE) ×1
SUT SILK 3 0 SH CR/8 (SUTURE) ×2 IMPLANT
SUT SILK 3-0 18XBRD TIE 12 (SUTURE) ×1 IMPLANT
SYS LAPSCP GELPORT 120MM (MISCELLANEOUS)
SYSTEM LAPSCP GELPORT 120MM (MISCELLANEOUS) IMPLANT
TIP RIGID 35CM EVICEL (HEMOSTASIS) ×4 IMPLANT
TOWEL OR 17X24 6PK STRL BLUE (TOWEL DISPOSABLE) ×2 IMPLANT
TOWEL OR 17X26 10 PK STRL BLUE (TOWEL DISPOSABLE) ×2 IMPLANT
TRAY FOLEY CATH 14FRSI W/METER (CATHETERS) IMPLANT
TRAY LAPAROSCOPIC (CUSTOM PROCEDURE TRAY) ×2 IMPLANT
TRAY PROCTOSCOPIC FIBER OPTIC (SET/KITS/TRAYS/PACK) IMPLANT
TROCAR ADV FIXATION 5X100MM (TROCAR) ×2 IMPLANT
TROCAR XCEL BLUNT TIP 100MML (ENDOMECHANICALS) IMPLANT
TROCAR XCEL NON-BLD 11X100MML (ENDOMECHANICALS) IMPLANT
TROCAR XCEL NON-BLD 5MMX100MML (ENDOMECHANICALS) ×2 IMPLANT
TUBE CONNECTING 12X1/4 (SUCTIONS) ×2 IMPLANT
TUBING FILTER THERMOFLATOR (ELECTROSURGICAL) ×2 IMPLANT
WATER STERILE IRR 1000ML POUR (IV SOLUTION) IMPLANT
YANKAUER SUCT BULB TIP NO VENT (SUCTIONS) ×2 IMPLANT

## 2012-05-26 NOTE — Brief Op Note (Signed)
05/26/2012  9:59 AM  PATIENT:  Dorna Leitz, 56 y.o., female, MRN: [id number]  PREOP DIAGNOSIS:  Chronic Anterior Wall Seroma  POSTOP DIAGNOSIS:   Chronic abdominal wall seroma, 12 x 25 cm.  PROCEDURE:   Procedure(s): LAPAROSCOPIC EXPLORATION of chronic abdominal walls seroma, DEBRIDEMENT of seroma, Injection of 20 cc of Eviseal, placement of #19 drains x 2.  SURGEON:   Ovidio Kin, M.D.  ASSISTANT:   None  ANESTHESIA:   general  Patrick North, CRNA - CRNA Aubery Lapping, MD - Anesthesiologist Edmonia Caprio, CRNA - CRNA Patrick North, CRNA - CRNA  General  EBL:  minimal  ml  BLOOD ADMINISTERED: none  DRAINS: none   LOCAL MEDICATIONS USED:   10 cc 1% marcaine  SPECIMEN:   none  COUNTS CORRECT:  YES  INDICATIONS FOR PROCEDURE:  Savonna Birchmeier is a 56 y.o. (DOB: 25-Jun-1956) white female whose primary care physician is Elie Confer, MD and comes for exploration of chronic seroma.   The indications and risks of the surgery were explained to the patient.  The risks include, but are not limited to, infection, bleeding, and nerve injury.  Note dictated to:   [phone]

## 2012-05-26 NOTE — Transfer of Care (Signed)
Immediate Anesthesia Transfer of Care Note  Patient: Terri [name]  Procedure(s) Performed: Procedure(s) (LRB): LAPAROSCOPIC ABDOMINAL EXPLORATION (N/A)  Patient Location: PACU  Anesthesia Type: General  Level of Consciousness: awake, alert , oriented and patient cooperative  Airway & Oxygen Therapy: Patient Spontanous Breathing and Patient connected to nasal cannula oxygen  Post-op Assessment: Report given to PACU RN, Post -op Vital signs reviewed and stable and Patient moving all extremities X 4  Post vital signs: Reviewed and stable  Complications: No apparent anesthesia complications

## 2012-05-26 NOTE — Op Note (Signed)
NAMESIMI, Terri [name]                  ACCOUNT NO.:  [id number]  MEDICAL RECORD NO.:  [id number]  LOCATION:  [facility]                         FACILITY:  [facility]  PHYSICIAN:  [name], M.D.  DATE OF BIRTH:  1956-03-21  DATE OF PROCEDURE:  05/26/2012                              OPERATIVE REPORT   PREOPERATIVE DIAGNOSIS:  Chronic anterior abdominal wall seroma.  POSTOPERATIVE DIAGNOSIS:  Chronic anterior abdominal wall seroma approximately 12 x 25 cm.  PROCEDURE:  Laparoscopic exploration of chronic abdominal seroma, debridement of seroma, injection of 20 mL of Evicel, placement of 19- [place] Blake drains x2.  SURGEON:  [name], MD  FIRST ASSISTANT:  None.  ANESTHESIA:  General endotracheal.  ESTIMATED BLOOD LOSS:  Minimal.  LOCAL ANESTHETIC:  10 mL.  INDICATION FOR PROCEDURE:  Terri [name] is a 56 year old white female who had an abdominoplasty by Dr. Delia [name] in May of 2010, who was hospitalized for a postop bleed.  She has since developed a chronic seroma of her anterior abdominal wall/abdominoplasty and now comes for attempted exploration and debridement of this seroma.  The indication and potential complications were explained to the patient.  Potential complications include, but not limited to, bleeding, infection, nerve injury, and recurrence of the seroma.  We also discussed continued aspiration.  I do not think will work or open excisions of the seroma which will be more invasive unless this seems reasonable first step to try.  OPERATIVE NOTE:  The patient was placed in supine position, room #1, had general anesthesia supervised by Dr. [name].  She was given 2 g of Ancef at initiation of procedure.  A time-out was held and surgical checklist run.  I ultrasounded the lower abdomen/seroma first and this cavity is approximately 12 x 25 cm. It is about 1.5 to 2.5 inches thick with fluid.  Under laparoscopic direction, I inserted a 5-mm trocar in the right  lateral aspect of the cavity.  I then inserted 2 more trocars, one suprapubic and a 5 mm one in the left lateral part of the cavity.  I irrigated the cavity out with about 2 L of saline.  It had some debris and proteinaceous material, but the real issue is this cavity has a rind which is probably 2-4 mm thick.  I tried to resect some of the rind with a Bovie.  I tried to remove some of the seroma cavity to see how we will be able to do laparoscopy whether this would be a fairly tedious surgery which would require probably a 3-hour block of time to do a significant resection of this rind.  I then placed 2 #19 Blake drains one on both sides, and then used Evicel as a fibrin glue to try to see if that will help the edges of this rind stick together and I placed 20 mL of the Evicel into the cavity.  I did take photos of the procedure, but we could not get them to print while I was doing the case.  Hopefully, I will have some at the end of the case.  The trocars were then removed.  Those incisions were closed with a 5-0  Vicryl suture.  The drains were sewn in place with 2-0 nylon suture.  The procedure went well.  I have got a good understanding of the cavity and its rind.  I will leave the drains in for a long time if the cavity recurs, option will be to do another laparoscopy through this time and attempted rind excision, this could be somewhat tedious and difficult or do an open exploration with excision of this cyst cavity.  The patient tolerated procedure well, was transferred to recovery room in good condition.  The patient was doing well.   [name], M.D., FACS   DHN/MEDQ  D:  05/26/2012  T:  05/26/2012  Job:  [id number]  cc:   Dr. Delia [name] Terri [name], M.D. Terri [name]

## 2012-05-26 NOTE — H&P (Addendum)
[facility]  Ovidio Kin, MD, FACS  [address] [place] [place], [place] [address]  Phone: [phone] FAX: [phone]  Re: Terri [name]  DOB: 11-01-1955  MRN: [id number]   ASSESSMENT AND PLAN:  1. Lap band - 06/16/2004.   Initial weight - 290, BMI - 46.6.   She lost her job at [employer], she now works for the "[employer].   UGI - 03/24/2012 - shows dilated esophagus with poor emptying. I think her band is too tight, though she has only vomited 4 x in last 3 months.   I removed the remaining fluid in the lap band today - approx 1.5 cc. This is a smaller amount than I expected, so I hope that esophagus returns to normal.   We will give her a 3 month band holiday.   2. Suprapubic abdominal mass - chronic abdominal wall seroma - 12 x 27 x 2.5 cm by my US exam. This coincides with the CT measurement on 12/04/2011.   Dr. Harless Litten called me about the patient on 03/30/2012 and we discussed the seroma.   I discussed three options with Ms. Chenevert: 1.) Leave the seroma alone or aspirate as necessary, 2.) Do an open operation to try to excise the entire "sac" of the seroma cavity in hopes it does not reform, 3.) Explore the seroma cavity laparoscopically, debride it as best I can, and leave drains in for 4+ weeks.  The patient is having enough discomfort that she wants "something done". We will start with the laparoscopic exploration and long term drain placement. I talked about the limits of surgery and the risks of recurrence. I told her I have done this laparoscopic exploration method before, but not for an abdominoplasty. She understands the plan and risks.   She has a friend here today.  Her husband is not here.  We talked about some post op plans.  For now, she is a 24 observation. 3. Abdominoplasty by Dr. Harless Litten in May 2010.   When she had the abdominoplasty - she was hospitalized post op and got 4 units of blood.  4. Dilated esophagus. Secondary to band too tight.  See #1.  5. Right subcostal pain. This has been going on about 3 weeks. It seems musculoskeletal in nature, though she said that she has not had any trauma. If it is persistent, we could do a [place] of her GB, though this does not seem biliary in nature. Her CT scan in March 2013 did not show anything, but it pre-dated her symptoms.   HISTORY OF PRESENT ILLNESS:   Terri [name] is a 56 y.o. (DOB: [birth year]) white female who is a patient of Elie Confer, MD and comes to me today for lap band follow up and an abdominal seroma. She now works for [employer]).  She comes with three concerns:  1.) I reviewed her UGI (03/24/2012) with her. It does show a dilated poorly emptying esophagus, though the lap band looks okay. She has only vomited about 4 times in the last three months and her reflux is better. But I think she needs a lap band holiday. Then we will consider upper endo or repeat UGI or both.  2.) The seroma in her lower abdomen has been drained three times, but keeps coming back. Dr. Benna Dunks called me. I discussed with her my thoughts and options. (See my assessment and plan #2) I reviewed with her the options that I see for surgery.  I spent a good amount of time going over these.  3.) RUQ/subcostal pain. This seems musculoskeletal by her description of pain on reaching and twisting. She gives no history of trauma. For now, we will give this time.  ROS:  Cards: Has seen Dr. Mendel Ryder in the past.   PHYSICAL EXAM:  BP 112/78  Pulse 87  Temp 98.3 F (36.8 C) (Oral)  Resp 20  SpO2 99%  LMP 05/25/2012   Ht 5\' 6"  (1.676 m)  Wt 237 lb 6 oz (107.673 kg)  BMI 38.31 kg/m2  General: WN WF who is alert and generally healthy appearing.  Lungs: Clear.  Heart: RRR, no murmur.  Abdomen: Soft. Mass between umbilicus and suprapubic area, but it is not very distinct. No tenderness or redness. Normal bowel sounds.  The lap band port sits about 3 cm above the umbilicus in the midline.    Procedure: I removed all fluid from the lap band - about 1.5 cc.  Abdominal wall [place]: 12 x 27 x 2.5 cm hypoechoic collection, c/w seroma. It does look approachable for laparoscopic trocars.   DATA REVIEWED:  Old chart.   Ovidio Kin, MD, FACS  Office: [phone]

## 2012-05-26 NOTE — Anesthesia Preprocedure Evaluation (Addendum)
Anesthesia Evaluation  Patient identified by MRN, date of birth, ID band Patient awake    Reviewed: Allergy & Precautions, H&P , NPO status , Patient's Chart, lab work & pertinent test results  History of Anesthesia Complications Negative for: history of anesthetic complications  Airway Mallampati: II TM Distance: >3 FB Neck ROM: Full    Dental  (+) Teeth Intact and Dental Advisory Given,    Pulmonary asthma , sleep apnea and Continuous Positive Airway Pressure Ventilation , former smoker,          Cardiovascular     Neuro/Psych    GI/Hepatic GERD-  Medicated,  Endo/Other    Renal/GU      Musculoskeletal   Abdominal   Peds  Hematology   Anesthesia Other Findings   Reproductive/Obstetrics                          Anesthesia Physical Anesthesia Plan  ASA: III  Anesthesia Plan: General   Post-op Pain Management:    Induction: Intravenous  Airway Management Planned: Oral ETT  Additional Equipment:   Intra-op Plan:   Post-operative Plan: Extubation in OR  Informed Consent: I have reviewed the patients History and Physical, chart, labs and discussed the procedure including the risks, benefits and alternatives for the proposed anesthesia with the patient or authorized representative who has indicated his/her understanding and acceptance.     Plan Discussed with: CRNA and Surgeon  Anesthesia Plan Comments:         Anesthesia Quick Evaluation

## 2012-05-26 NOTE — Anesthesia Postprocedure Evaluation (Signed)
Anesthesia Post Note  Patient: Terri [name]  Procedure(s) Performed: Procedure(s) (LRB): LAPAROSCOPIC ABDOMINAL EXPLORATION (N/A)  Anesthesia type: general  Patient location: PACU  Post pain: Pain level controlled  Post assessment: Patient's Cardiovascular Status Stable  Last Vitals:  Filed Vitals:   05/26/12 1101  BP:   Pulse: 82  Temp:   Resp: 14    Post vital signs: Reviewed and stable  Level of consciousness: sedated  Complications: No apparent anesthesia complications

## 2012-05-27 ENCOUNTER — Telehealth (INDEPENDENT_AMBULATORY_CARE_PROVIDER_SITE_OTHER): Payer: Self-pay

## 2012-05-27 NOTE — Discharge Summary (Signed)
Terri [name], Terri [name]                  ACCOUNT NO.:  [id number]  MEDICAL RECORD NO.:  [id number]  LOCATION:  6N22C                        FACILITY:  [facility]  PHYSICIAN:  [name], M.D.  DATE OF BIRTH:  10/01/1955  DATE OF ADMISSION:  05/26/2012 DATE OF DISCHARGE:  05/27/2012                              DISCHARGE SUMMARY  DISCHARGE DIAGNOSES: 1. Chronic abdominal wall seroma. 2. Status post lap band on June 16, 1994, with initial weight of     290, BMI of 46.6. 3. She currently had some evidence of esophageal dilatation, is on a     band holiday. 4. Abdominoplasty by [name] May 2010, complicated with     postoperative bleed. 5. Dilated esophagus secondary to lap band. 6. History of right subcostal pain which appears to be musculoskeletal     in nature. 7. Morbid obesity.  OPERATIONS PERFORMED:  The patient underwent a laparoscopic exploration for chronic abdominal seroma with debridement of seroma and placement of two 19-French Blake drains on May 26, 2012.  HISTORY OF ILLNESS:  [name] is a 56 year old white female, who sees Carolin Coy, as her primary medical doctor.  I follow her for a lap band which was placed for morbid obesity/weight loss on June 16, 2004.   She however has developed a lower abdominal wall cystic mass, which     has caused increasing discomfort.  This dates back to when she had an abdominoplasty by [name] in May  2010.  Her postop course was complicated with a bleed which required about 4 units of blood.  She has had this seroma aspirated at least twice, but it has returned fairly promptly and I talked about options of managing this seroma.    I discussed three potential options:  1.I discussed to continue to aspirate the seroma as necessary.  2. To do an open operation to try and excise the entire sac of the seroma.  3. To laparoscopically try to debride seroma and clean out as best we can.  Leave long-term drains  to see if we this will get the seroma to seal.  She decided to take the laparoscopic approach.  HOSPITAL COURSE:  She walked to the operating room on the day of admission where she underwent a laparoscopic exploration of the chronic abdominal seroma with debridement of the seroma and placement of two 19-French Blake drains by Dr. Algis Downs. [name].  Postoperatively, she has done well.  She has been somewhat sore, but tolerating a diet.  Her drains have been minimal output so far.  We talked about long-term management of these and she is ready for discharge home.  She is already being given Roxicet for pain.   She is to empty these drains daily and record the amount.   She will be out of work until June 13, 2012.   She has already been given Roxicet for pain and she will return to see me in 2 or 3 weeks for followup.  [name], M.D., FACS   DHN/MEDQ  D:  05/27/2012  T:  05/27/2012  Job:  [id number]  cc:   [name] Carola J.  [name], M.D. Dr. Garnette Scheuermann

## 2012-05-27 NOTE — Progress Notes (Signed)
Discharge instructions/Med Rec Sheet reviewed w/ pt. Pt expressed understanding and copies given w/ prescriptions. Pt d/c'd in stable condition via w/c, accompanied by discharge volunteers 

## 2012-05-27 NOTE — Telephone Encounter (Signed)
I called and left the pt a message with her po appointment

## 2012-05-27 NOTE — Progress Notes (Addendum)
General Surgery Note  LOS: 1 day  POD# 1   Assessment/Plan: 1.  Laparoscopic exploration of chronic abdominal seroma, debridement of seroma, injection of 20 mL of Evicel, placement of 19-French Blake drains x2 - D. Lunah Losasso - 05/26/2012  Ready for discharge.  D/C dictation - [id number]  2. Lap band - 06/16/2004.   Initial weight - 290, BMI - 46.6.   She lost her job at [employer], she now works for the [employer]   UGI - 03/24/2012 - shows dilated esophagus with poor emptying. I think her band is too tight, though she has only vomited 4 x in last 3 months.  She is on a "band vacation" 3. Abdominoplasty by Dr. Harless Litten in May 2010.   When she had the abdominoplasty - she was hospitalized post op and got 4 units of blood. See #1. 4. Dilated esophagus. Secondary to band too tight. See #1.  5. Right subcostal pain. It seems musculoskeletal in nature, though she said that she has not had any trauma.  Subjective:  Doing well.  Some soreness, but not too bad.  She has good handle on the plan for the drains. Objective:   Filed Vitals:   05/27/12 0604  BP: 144/78  Pulse: 87  Temp: 98.7 F (37.1 C)  Resp: 16     Intake/Output from previous day:  08/29 0701 - 08/30 0700 In: 3199.4 [P.O.:240; I.V.:2959.4] Out: 60 [Drains:60]  Intake/Output this shift:      Physical Exam:   General: WN obese WF who is alert and oriented.    HEENT: Normal. Pupils equal. .   Abdomen: Soft   Wound: Covered with dressing, the drains have only a small amount out.   Neurologic:  Grossly intact to motor and sensory function.   Psychiatric: Has normal mood and affect.   Lab Results:   No results found for this basename: WBC:2,HGB:2,HCT:2,PLT:2 in the last 72 hours  BMET  No results found for this basename: NA:2,K:2,CL:2,CO2:2,GLUCOSE:2,BUN:2,CREATININE:2,CALCIUM:2 in the last 72 hours  PT/INR  No results found for this basename: LABPROT:2,INR:2 in the last 72 hours  ABG  No results found for this basename:  PHART:2,PCO2:2,PO2:2,HCO3:2 in the last 72 hours   Studies/Results:  No results found.   Anti-infectives:   Anti-infectives     Start     Dose/Rate Route Frequency Ordered Stop   05/26/12 0602   ceFAZolin (ANCEF) IVPB 2 g/50 mL premix  Status:  Discontinued        2 g 100 mL/hr over 30 Minutes Intravenous 60 min pre-op 05/26/12 0602 05/26/12 0604   05/25/12 1445   ceFAZolin (ANCEF) IVPB 2 g/50 mL premix        2 g 100 mL/hr over 30 Minutes Intravenous 60 min pre-op 05/25/12 1445 05/26/12 0755          Ovidio Kin, MD, FACS Pager: [phone],   [facility] Office: [phone] 05/27/2012

## 2012-06-01 ENCOUNTER — Telehealth (INDEPENDENT_AMBULATORY_CARE_PROVIDER_SITE_OTHER): Payer: Self-pay | Admitting: General Surgery

## 2012-06-01 ENCOUNTER — Other Ambulatory Visit (INDEPENDENT_AMBULATORY_CARE_PROVIDER_SITE_OTHER): Payer: Self-pay

## 2012-06-01 DIAGNOSIS — N390 Urinary tract infection, site not specified: Secondary | ICD-10-CM

## 2012-06-01 DIAGNOSIS — R42 Dizziness and giddiness: Secondary | ICD-10-CM

## 2012-06-01 LAB — CBC WITH DIFFERENTIAL/PLATELET
Basophils Absolute: 0 10*3/uL (ref 0.0–0.1)
Basophils Relative: 0 % (ref 0–1)
Eosinophils Absolute: 0.1 10*3/uL (ref 0.0–0.7)
Eosinophils Relative: 0 % (ref 0–5)
HCT: 35.7 % — ABNORMAL LOW (ref 36.0–46.0)
Hemoglobin: 12.4 g/dL (ref 12.0–15.0)
Lymphocytes Relative: 16 % (ref 12–46)
Lymphs Abs: 2.2 10*3/uL (ref 0.7–4.0)
MCH: 30.4 pg (ref 26.0–34.0)
MCHC: 34.7 g/dL (ref 30.0–36.0)
MCV: 87.5 fL (ref 78.0–100.0)
Monocytes Absolute: 1.4 10*3/uL — ABNORMAL HIGH (ref 0.1–1.0)
Monocytes Relative: 10 % (ref 3–12)
Neutro Abs: 10.2 10*3/uL — ABNORMAL HIGH (ref 1.7–7.7)
Neutrophils Relative %: 74 % (ref 43–77)
Platelets: 344 10*3/uL (ref 150–400)
RBC: 4.08 MIL/uL (ref 3.87–5.11)
RDW: 13.4 % (ref 11.5–15.5)
WBC: 13.9 10*3/uL — ABNORMAL HIGH (ref 4.0–10.5)

## 2012-06-01 NOTE — Telephone Encounter (Signed)
Pt calling from another MD appt (at [facility]) where her they have taken her temperature:  100.9.  She has felt increasingly tired last few days, but did not take her temp at home.  She had surgery on 05/26/12 per Dr. Ezzard Standing.  Pt states she has drains in place, which are draining with a small amount coming around the drain itself.  There is no odor or increased redness from site.  Consulted Dr. Luisa Hart, as Dr. Ezzard Standing unavailable: ordered CBC and Ua, with reflex C&S.  Orders entered and FAXd to [facility].  Pt contacted to go there after her appt, and [facility] will contact her tomorrow with results.  Resume cough and deep breathing at home.  She understands.

## 2012-06-02 LAB — URINALYSIS W MICROSCOPIC + REFLEX CULTURE
Bacteria, UA: NONE SEEN
Casts: NONE SEEN
Glucose, UA: NEGATIVE mg/dL
Ketones, ur: NEGATIVE mg/dL
Leukocytes, UA: NEGATIVE
Nitrite: NEGATIVE
Protein, ur: 30 mg/dL — AB
Specific Gravity, Urine: >1.03 — ABNORMAL HIGH (ref 1.005–1.030)
Urobilinogen, UA: 0.2 mg/dL (ref 0.0–1.0)
pH: 5.5 (ref 5.0–8.0)

## 2012-06-03 ENCOUNTER — Ambulatory Visit (INDEPENDENT_AMBULATORY_CARE_PROVIDER_SITE_OTHER): Payer: BC Managed Care – PPO | Admitting: Surgery

## 2012-06-03 ENCOUNTER — Encounter (INDEPENDENT_AMBULATORY_CARE_PROVIDER_SITE_OTHER): Payer: Self-pay | Admitting: Surgery

## 2012-06-03 VITALS — BP 140/84 | HR 87 | Temp 98.4°F | Resp 18 | Ht 66.0 in | Wt 240.6 lb

## 2012-06-03 DIAGNOSIS — IMO0002 Reserved for concepts with insufficient information to code with codable children: Secondary | ICD-10-CM

## 2012-06-03 NOTE — Progress Notes (Signed)
Post Op Note:  See 04/14/2012 note for complete history.  Patient had laparoscopic exploration of chronic abdominal seroma on 05/26/2012.  She has done well, except she has had fatigue and a fever.  Her temp this AM was 101.4.  But this afternoon, her temp is 98.4 and her pulse 87.  Her WBC on 06/01/2012 was 13,900 and her urine showed hematuria.  She is having no urine symptoms.  Her abdomen looks okay.  She has no excessive tenderness.  Her drains are approx 40 cc per day on the left and 10 cc per day on the right.  She is wearing an abdominal binder.  I gave her Septra DS - #28 - 1 BID for 2 weeks.  She will see me back next week.  Ovidio Kin, MD, [facility] Pager: [phone] Office phone:  [phone]

## 2012-06-08 ENCOUNTER — Ambulatory Visit (INDEPENDENT_AMBULATORY_CARE_PROVIDER_SITE_OTHER): Payer: BC Managed Care – PPO | Admitting: Surgery

## 2012-06-08 ENCOUNTER — Encounter (INDEPENDENT_AMBULATORY_CARE_PROVIDER_SITE_OTHER): Payer: Self-pay | Admitting: Surgery

## 2012-06-08 VITALS — BP 136/84 | HR 68 | Temp 97.3°F | Resp 14 | Ht 66.0 in | Wt 239.1 lb

## 2012-06-08 DIAGNOSIS — IMO0002 Reserved for concepts with insufficient information to code with codable children: Secondary | ICD-10-CM

## 2012-06-08 NOTE — Progress Notes (Signed)
Post Op Note:  See 04/14/2012 note for complete history.  Patient had laparoscopic exploration of chronic abdominal seroma on 05/26/2012.  She has done well, except she has had fatigue and a fever.  She probably had an infection (unknown location - urine vs wound) for which I gave her Septra DS.  She responded quickly and has done well since then.  Her abdomen looks okay.  She has no excessive tenderness.  Her drains are approx 20 cc per day on the left and 10 cc per day on the right.  She is wearing an abdominal binder.  I will probably Korea her abdomen next week.  She will see me back next week.  Ovidio Kin, MD, [facility] Pager: [phone] Office phone:  [phone]

## 2012-06-15 ENCOUNTER — Ambulatory Visit (INDEPENDENT_AMBULATORY_CARE_PROVIDER_SITE_OTHER): Payer: BC Managed Care – PPO | Admitting: Surgery

## 2012-06-15 ENCOUNTER — Encounter (INDEPENDENT_AMBULATORY_CARE_PROVIDER_SITE_OTHER): Payer: Self-pay | Admitting: Surgery

## 2012-06-15 VITALS — BP 138/80 | HR 76 | Temp 98.5°F | Resp 18 | Ht 66.0 in | Wt 238.6 lb

## 2012-06-15 DIAGNOSIS — IMO0002 Reserved for concepts with insufficient information to code with codable children: Secondary | ICD-10-CM

## 2012-06-15 NOTE — Progress Notes (Signed)
Name:  Terri [name] MRN:  [id number] DOB:  [birth year]  Post Op Note #3:  See 04/14/2012 note for complete history.  [name] had laparoscopic exploration of chronic abdominal seroma on 05/26/2012.  Early after surgery, she had fatigue, felt poorly, and had a low grade temp.  I treated her with Septra DS, though I was not sure what I was treating.  She responded quickly and has done well since then.  Her abdomen looks okay.  Her drains are draining less than 10 cc per day.  She has some tenderness at the drain sites, but no obvious infection.  I did Korea her lower abdomen.  She has a stripe which is about 1 cm wide.  I see no obvious fluid collectin.  I did not take pictures, but next time I see her, I will try to document what I see.  She is wearing an abdominal binder.  She will see me back in 2 weeks week.  Ovidio Kin, MD, [facility] Pager: [phone] Office phone:  [phone]

## 2012-06-23 ENCOUNTER — Encounter: Payer: Self-pay | Admitting: Internal Medicine

## 2012-06-30 ENCOUNTER — Encounter (INDEPENDENT_AMBULATORY_CARE_PROVIDER_SITE_OTHER): Payer: Self-pay | Admitting: Surgery

## 2012-06-30 ENCOUNTER — Ambulatory Visit (INDEPENDENT_AMBULATORY_CARE_PROVIDER_SITE_OTHER): Payer: BC Managed Care – PPO | Admitting: Surgery

## 2012-06-30 VITALS — BP 128/82 | HR 84 | Temp 98.3°F | Resp 12 | Ht 66.0 in | Wt 236.0 lb

## 2012-06-30 DIAGNOSIS — IMO0002 Reserved for concepts with insufficient information to code with codable children: Secondary | ICD-10-CM

## 2012-06-30 NOTE — Progress Notes (Signed)
Name:  Terri [name] MRN:  [id number] DOB:  [birth year]  Post Op Note #4:  See 04/14/2012 note for complete history.  Ms. Lehrman had laparoscopic exploration of chronic abdominal seroma on 05/26/2012.  Early after surgery, she had fatigue, felt poorly, and had a low grade temp.  I treated her with Septra DS, though I was not sure what I was treating.  She responded quickly and has done well since then.  BP 128/82  Pulse 84  Temp 98.3 F (36.8 C) (Oral)  Resp 12  Ht 5\' 6"  (1.676 m)  Wt 236 lb (107.049 kg)  BMI 38.09 kg/m2  Her abdomen looks okay.  Her drains are draining less than 10 cc per day. The left slightly more than the right.   The drainage does look a little 'junky".  She has no obvious infection. The suture holding her left drain is out.  She said that she has had some fevers at home, but she does not in or office and I do not plan to treat her for now.  She does say that she has a cough.  I did another Korea her lower abdomen.  She has a stripe which is about 1 - 1.5 cm wide.  I see no obvious fluid collectin.  I did not take pictures.  She is wearing an abdominal binder.  She will see me back in 2 weeks week.  Ovidio Kin, MD, [facility] Pager: [phone] Office phone:  [phone]

## 2012-07-13 ENCOUNTER — Ambulatory Visit (INDEPENDENT_AMBULATORY_CARE_PROVIDER_SITE_OTHER): Payer: BC Managed Care – PPO | Admitting: Surgery

## 2012-07-13 ENCOUNTER — Encounter (INDEPENDENT_AMBULATORY_CARE_PROVIDER_SITE_OTHER): Payer: Self-pay | Admitting: Surgery

## 2012-07-13 VITALS — BP 152/94 | HR 88 | Temp 98.8°F | Resp 18 | Ht 66.0 in | Wt 238.4 lb

## 2012-07-13 DIAGNOSIS — IMO0002 Reserved for concepts with insufficient information to code with codable children: Secondary | ICD-10-CM

## 2012-07-13 DIAGNOSIS — Z9884 Bariatric surgery status: Secondary | ICD-10-CM

## 2012-07-13 NOTE — Progress Notes (Addendum)
Name:  Terri [name] MRN:  [id number] DOB:  02-Apr-1956  Post Op Note #5:  See 04/14/2012 note for complete history.  Terri [name] had laparoscopic exploration and drainage of a chronic abdominal seroma on 05/26/2012.  Early after surgery, she had fatigue, felt poorly, and had a low grade temp.  I treated her with Septra DS, though I was not sure what I was treating.  She responded quickly and has done well since then.  She comes in good spirits today.  She has a system for changing the drains.  BP 152/94  Pulse 88  Temp 98.8 F (37.1 C) (Temporal)  Resp 18  Ht 5\' 6"  (1.676 m)  Wt 238 lb 6.4 oz (108.138 kg)  BMI 38.48 kg/m2  Her abdomen looks okay.  Her drains are draining about 10 cc per drain per day. The left drainage does look a little 'junky".  Both holding sutures have pulled out.   I did another Korea her lower abdomen.  She has a stripe which is about 1 - 1.5 cm wide.  I see no obvious fluid collectin.  I did take pictures.  She is wearing an abdominal binder.  She will see me back in 2 weeks week.  We will probably remove one of the drains next week.  The left one is the most likely candidate.  [Her right drain fell out 07/15/2012.  I checked with her by phone and she is doing well.  DN  07/15/2012]  Ovidio Kin, MD, [facility] Pager: [phone] Office phone:  [phone]

## 2012-07-15 ENCOUNTER — Telehealth (INDEPENDENT_AMBULATORY_CARE_PROVIDER_SITE_OTHER): Payer: Self-pay

## 2012-07-15 NOTE — Telephone Encounter (Signed)
The patient called to report her right drain fell out.  The suture was out and she has put a bandage on the area.  The drains had been putting out less than 10 cc in 24 hrs.  I told her to watch for increased pain and swelling.  Otherwise we will see her 10/31

## 2012-07-17 ENCOUNTER — Other Ambulatory Visit: Payer: Self-pay | Admitting: Internal Medicine

## 2012-07-28 ENCOUNTER — Ambulatory Visit (INDEPENDENT_AMBULATORY_CARE_PROVIDER_SITE_OTHER): Payer: BC Managed Care – PPO | Admitting: Surgery

## 2012-07-28 ENCOUNTER — Encounter (INDEPENDENT_AMBULATORY_CARE_PROVIDER_SITE_OTHER): Payer: Self-pay | Admitting: Surgery

## 2012-07-28 DIAGNOSIS — IMO0002 Reserved for concepts with insufficient information to code with codable children: Secondary | ICD-10-CM

## 2012-07-28 NOTE — Progress Notes (Signed)
Name:  Terri [name] MRN:  [id number] DOB:  April 18, 1956  Post Op Note #6:  See 04/14/2012 note for complete history.  [name] had laparoscopic exploration and drainage of a chronic abdominal seroma on 05/26/2012.  Early after surgery, she had fatigue, felt poorly, and had a low grade temp.  I treated her with Septra DS, though I was not sure what I was treating.  She responded quickly and has done well since then.  She comes in good spirits today.  She has a system for changing the drains.  There were no vitals taken for this visit.  Her right drain fell out 07/15/2012.  I did another Korea her lower abdomen.  She has a stripe which is about 1.0 cm wide.  I see no obvious fluid collectin.  I did take pictures.  The left drain is putting out 10 to 15 cc per day.  She is wearing an abdominal binder.  She will see me back in about 2 to 3 weeks.  I have left her left drain in place.  But overall I think thinks are going well.  There has been no significant accumulation  Ovidio Kin, MD, [facility] Surgery Pager: [phone] Office phone:  [phone]

## 2012-08-01 ENCOUNTER — Telehealth: Payer: Self-pay | Admitting: Internal Medicine

## 2012-08-02 NOTE — Telephone Encounter (Signed)
Spoke with patient and she will call next week to schedule ECOL that is overdue when Jan. Schedule is out. She will get OTC PPI to take

## 2012-08-12 ENCOUNTER — Telehealth (INDEPENDENT_AMBULATORY_CARE_PROVIDER_SITE_OTHER): Payer: Self-pay | Admitting: General Surgery

## 2012-08-12 ENCOUNTER — Encounter: Payer: Self-pay | Admitting: Internal Medicine

## 2012-08-12 ENCOUNTER — Telehealth (INDEPENDENT_AMBULATORY_CARE_PROVIDER_SITE_OTHER): Payer: Self-pay

## 2012-08-12 NOTE — Telephone Encounter (Signed)
Pt called to report to Dr. Ezzard Standing that her remaining drain fell out today.  She continues to feel fine.  Asks if she needs to come in to appt next week, which was to check on the drain (that is now out.)

## 2012-08-12 NOTE — Telephone Encounter (Signed)
Returned patients call about her 2nd drain falling out, She denies fever / discomfort .I told her to call if she had any changes and also  I informed her about her appt scheduled for 08/17/12 @ 12:20

## 2012-08-17 ENCOUNTER — Encounter (INDEPENDENT_AMBULATORY_CARE_PROVIDER_SITE_OTHER): Payer: Self-pay | Admitting: Surgery

## 2012-08-17 ENCOUNTER — Ambulatory Visit (INDEPENDENT_AMBULATORY_CARE_PROVIDER_SITE_OTHER): Payer: BC Managed Care – PPO | Admitting: Surgery

## 2012-08-17 VITALS — BP 136/72 | HR 112 | Temp 98.0°F | Resp 18 | Ht 66.0 in | Wt 244.2 lb

## 2012-08-17 DIAGNOSIS — IMO0002 Reserved for concepts with insufficient information to code with codable children: Secondary | ICD-10-CM

## 2012-08-17 NOTE — Progress Notes (Addendum)
[facility]  Ovidio Kin, MD,  FACS [address] [place]  [address] [place], [place]    [address] Phone:  [phone] FAX:  [phone]   Re:   Terri [name] DOB:   [birth year] MRN:   [id number]  ASSESSMENT AND PLAN: 1.  Lap band - 06/16/2004.  Initial weight - 290, BMI - 46.6.  At this time, she still has all her fluid out of the lap band.  We will let her get through the holidays and then re-address putting fluid in the lap band.  She can see me or Mardelle Matte in 3 months.  If she is doing well and she cancels that appointment, I would like to see her on her anniversary of her lap band. [Note from Dr. Juanda Chance - EGD.  Terri [name] has reflux and HH.  DN 10/20/12]   2.  Laparoscopic exploration and drainage of a chronic abdominal seroma on 05/26/2012.    Post Op Visit #7  Both her drains are out.  Things look good right now, both on PE and on Korea.  No further diagnostic test planned.  She is okay to go ahead with yoga and any other exercise that she wants to try.  3.  Abdominoplasty by Dr. Harless Litten in May 2010.  When she had the abdominoplasty - she was hospitalized post op and got 4 units of blood.    4.  Obst Sleep Apnea - on CPAP  Followed by Dr. Doreatha Massed.  HISTORY OF PRESENT ILLNESS: Chief Complaint  Patient presents with  . Routine Post Op   Terri [name] is a 56 y.o. (DOB: 1956-04-07)  white female who is a patient of Elie Confer, MD and comes to me today for follow up of an abdominal seroma excision.   She now works for [employer]).    Her second drain fell out about one week ago.  She has had done well since then and noticed no new mass or abnormality.  She is in good spirits.  We also talked about the lap band.  She is up for an upper endoscopy and colonoscopy by Dr. Juanda Chance in January 2014.  History of lower abdominal mass: Suprapubic abdominal mass - chronic abdominal wall seroma - 12 x 27 x 2.5 cm by my US exam.  This coincides with the  CT measurement on 12/04/2011.  Probably secondary to abdominoplasty by Dr. Harless Litten in May 2010.  ROS: Cards: Has seen Dr. Mendel Ryder in the past.  PHYSICAL EXAM: BP 136/72  Pulse 112  Temp 98 F (36.7 C)  Resp 18  Ht 5\' 6"  (1.676 m)  Wt 244 lb 3.2 oz (110.768 kg)  BMI 39.41 kg/m2  General: WN WF who is alert and generally healthy appearing.  Lungs:  Clear. Heart:  RRR, no murmur. Abdomen: Soft. She has the changes from the abdominoplasty, but no palpable mass.  She feels comfortable.  Abdominal wall Korea:  She has a thin strip, less than 1.0 cm visible.  But there is not abnormal appearing collection of fluid.  DATA REVIEWED: Old chart.  Ovidio Kin, MD, FACS Office:  [phone]

## 2012-09-30 ENCOUNTER — Ambulatory Visit (AMBULATORY_SURGERY_CENTER): Payer: BC Managed Care – PPO | Admitting: *Deleted

## 2012-09-30 VITALS — Ht 66.0 in | Wt 254.8 lb

## 2012-09-30 DIAGNOSIS — K2270 Barrett's esophagus without dysplasia: Secondary | ICD-10-CM

## 2012-09-30 DIAGNOSIS — Z1211 Encounter for screening for malignant neoplasm of colon: Secondary | ICD-10-CM

## 2012-09-30 MED ORDER — MOVIPREP 100 G PO SOLR: ORAL | Status: DC

## 2012-09-30 NOTE — Progress Notes (Signed)
I have reviewed the chart, she had a drainage of a seroma from the abdominal incision in 04/2012, Last OV with Dr Ezzard Standing 07/2012, all was well. OK to go ahead with EGD/colon ----- Message ----- From: Inocente Salles, RN Sent: 09/26/2012 3:32 PM To: Hart Carwin, MD Dr. Juanda Chance, Could you please review this pt's chart? She had an abdominal procedure in August so I just wanted to make sure she was ok to have her procedure done on 10-14-12. She is having an endo/colon. Thank you, Baxter Hire

## 2012-10-14 ENCOUNTER — Ambulatory Visit (AMBULATORY_SURGERY_CENTER): Payer: BC Managed Care – PPO | Admitting: Internal Medicine

## 2012-10-14 ENCOUNTER — Encounter: Payer: Self-pay | Admitting: Internal Medicine

## 2012-10-14 VITALS — BP 159/75 | HR 84 | Temp 97.3°F | Resp 12 | Ht 66.0 in | Wt 254.0 lb

## 2012-10-14 DIAGNOSIS — Z86010 Personal history of colonic polyps: Secondary | ICD-10-CM

## 2012-10-14 DIAGNOSIS — Z1211 Encounter for screening for malignant neoplasm of colon: Secondary | ICD-10-CM

## 2012-10-14 DIAGNOSIS — K2100 Gastro-esophageal reflux disease with esophagitis, without bleeding: Secondary | ICD-10-CM

## 2012-10-14 DIAGNOSIS — K208 Other esophagitis without bleeding: Secondary | ICD-10-CM

## 2012-10-14 DIAGNOSIS — K219 Gastro-esophageal reflux disease without esophagitis: Secondary | ICD-10-CM

## 2012-10-14 DIAGNOSIS — K2270 Barrett's esophagus without dysplasia: Secondary | ICD-10-CM

## 2012-10-14 HISTORY — PX: COLONOSCOPY: SHX174

## 2012-10-14 MED ORDER — SODIUM CHLORIDE 0.9 % IV SOLN: 500.0000 mL | INTRAVENOUS | Status: DC

## 2012-10-14 MED ORDER — OMEPRAZOLE 40 MG PO CPDR: 40.0000 mg | DELAYED_RELEASE_CAPSULE | Freq: Two times a day (BID) | ORAL | Status: DC

## 2012-10-14 NOTE — Op Note (Signed)
[facility] [address] [place], [address]   COLONOSCOPY PROCEDURE REPORT  PATIENT: Terri [name], Terri [name]  MR#: [id number] BIRTHDATE: [birth year] , 56  yrs. old GENDER: Female ENDOSCOPIST: Hart Carwin, MD REFERRED BY:  Carolin Coy, M.D. PROCEDURE DATE:  10/14/2012 PROCEDURE:   Colonoscopy, surveillance ASA CLASS:   Class III INDICATIONS:hyperplastic polyp 2005- postpolypectomy bleed requiring hospitalization. MEDICATIONS: MAC sedation, administered by CRNA and Propofol (Diprivan) 130 mg IV  DESCRIPTION OF PROCEDURE:   After the risks and benefits and of the procedure were explained, informed consent was obtained.  digital exam normal         The LB [id number]-H180AL [id number]  endoscope was introduced through the anus and advanced to the cecum, which was identified by both the appendix and ileocecal valve .  The quality of the prep was good, using MoviPrep .  The instrument was then slowly withdrawn as the colon was fully examined.     COLON FINDINGS: Mild diverticulosis was noted in the sigmoid colon. Retroflexed views revealed no abnormalities.     The scope was then withdrawn from the patient and the procedure completed.  COMPLICATIONS: There were no complications. ENDOSCOPIC IMPRESSION: Mild diverticulosis was noted in the sigmoid colon  RECOMMENDATIONS: High fiber diet   REPEAT EXAM: In 10 year(s)  for Colonoscopy.  cc:  _______________________________ eSignedHart Carwin, MD 10/14/2012 10:00 AM

## 2012-10-14 NOTE — Progress Notes (Signed)
Patient did not experience any of the following events: a burn prior to discharge; a fall within the facility; wrong site/side/patient/procedure/implant event; or a hospital transfer or hospital admission upon discharge from the facility. (G8907) Patient did not have preoperative order for IV antibiotic SSI prophylaxis. (G8918)  

## 2012-10-14 NOTE — Patient Instructions (Addendum)

## 2012-10-14 NOTE — Op Note (Signed)
[facility] [address] [place], [address]   ENDOSCOPY PROCEDURE REPORT  PATIENT: Terri, [name]  MR#: [id number] BIRTHDATE: [birth year] , 56  yrs. old GENDER: Female ENDOSCOPIST: Hart Carwin, MD REFERRED BY:  Carolin Coy, M.D. , Dr D.Newman PROCEDURE DATE:  10/14/2012 PROCEDURE:  EGD w/ biopsy ASA CLASS:     Class III INDICATIONS:  history of Barrett's esophagus.   Heartburn.   hx lap band surgery, deflated at present time, EGD 2011- Barrett's esophagus. MEDICATIONS: propofol (Diprivan) 250mg  IV and Robinul 0.2 mg IV TOPICAL ANESTHETIC: none  DESCRIPTION OF PROCEDURE: After the risks benefits and alternatives of the procedure were thoroughly explained, informed consent was obtained.  The LB GIF-H180 [id number] endoscope was introduced through the mouth and advanced to the second portion of the duodenum. Without limitations.  The instrument was slowly withdrawn as the mucosa was fully examined.      esophagus there was grade 3 reflux esophagitis with the long erosions extending at least 2 cm proximal from the Z line. There was no stricture. There was some coffee-ground material covering the erosions. Patient was gagging during the procedure and there was a reflux and prolapse of his gastric mucosa into the esophagus, biopsies were taken from the GE junction and from the erosions for followup of Barrett's esophagus[ ,there was a 4 cm partially reducible hiatal hernia with no evidence of Cameron erosions The scope was then withdrawn from the patient and the procedure completed.retroflexed view of the cardia showed presence of hiatal hernia  COMPLICATIONS: There were no complications. ENDOSCOPIC IMPRESSION: grade 3 reflux esophagitis status post biopsies. No evidence of esophageal stricture It to 4 cm partially reducible hiatal hernia Status post prior lap band surgery for obesity the band remains deflated RECOMMENDATIONS: 1.  Await biopsy  results 2.  Anti-reflux regimen to be follow 3.  Continue PPI  REPEAT EXAM: for EGD pending biopsy results.  .  2 years  eSigned:  Hart Carwin, MD 10/14/2012 9:56 AM   CC:  PATIENT NAME:  Terri, [name] MR#: [id number]

## 2012-10-14 NOTE — Progress Notes (Signed)
4098 a/ox3 pleased with MAC report to [employer]

## 2012-10-14 NOTE — Progress Notes (Signed)
Called to room to assist during endoscopic procedure.  Patient ID and intended procedure confirmed with present staff. Received instructions for my participation in the procedure from the performing physician.  

## 2012-10-17 ENCOUNTER — Telehealth: Payer: Self-pay

## 2012-10-17 NOTE — Telephone Encounter (Signed)
Left a message at [phone] for the pt to call us back if she has any questions or concerns. Maw

## 2012-10-19 ENCOUNTER — Encounter: Payer: Self-pay | Admitting: Internal Medicine

## 2012-11-10 ENCOUNTER — Encounter (INDEPENDENT_AMBULATORY_CARE_PROVIDER_SITE_OTHER): Payer: BC Managed Care – PPO

## 2012-11-17 ENCOUNTER — Encounter (INDEPENDENT_AMBULATORY_CARE_PROVIDER_SITE_OTHER): Payer: BC Managed Care – PPO

## 2012-12-29 ENCOUNTER — Encounter (INDEPENDENT_AMBULATORY_CARE_PROVIDER_SITE_OTHER): Payer: Self-pay

## 2012-12-29 ENCOUNTER — Ambulatory Visit (INDEPENDENT_AMBULATORY_CARE_PROVIDER_SITE_OTHER): Payer: BC Managed Care – PPO | Admitting: Physician Assistant

## 2012-12-29 VITALS — BP 142/96 | HR 90 | Temp 98.6°F | Resp 18 | Ht 66.0 in | Wt 257.8 lb

## 2012-12-29 DIAGNOSIS — Z4651 Encounter for fitting and adjustment of gastric lap band: Secondary | ICD-10-CM

## 2012-12-29 NOTE — Progress Notes (Signed)
  HISTORY: Terri [name] is a 57 y.o.female who received an 10cm lap-band in September 2005 by Dr. Ezzard Standing. She comes in with no further GERD symptoms and an empty band. She's gained weight and has no symptoms of restriction. She wants to get back on track with weight loss with a fill today.  VITAL SIGNS: Filed Vitals:   12/29/12 1125  BP: 142/96  Pulse: 90  Temp: 98.6 F (37 C)  Resp: 18    PHYSICAL EXAM: Physical exam reveals a very well-appearing 57 y.o.female in no apparent distress Neurologic: Awake, alert, oriented Psych: Bright affect, conversant Respiratory: Breathing even and unlabored. No stridor or wheezing Abdomen: Soft, nontender, nondistended to palpation. Incisions well-healed. No incisional hernias. Port easily palpated. Extremities: Atraumatic, good range of motion.  ASSESMENT: 57 y.o.  female  s/p 10cm lap-band.   PLAN: The patient's port was accessed with a 20G Huber needle without difficulty. Clear fluid was aspirated and 1 mL saline was added to the port to give a total predicted volume of 1 mL. The patient was able to swallow water without difficulty following the procedure and was instructed to take clear liquids for the next 24-48 hours and advance slowly as tolerated.

## 2012-12-29 NOTE — Patient Instructions (Signed)
Take clear liquids tonight. Thin protein shakes are ok to start tomorrow morning. Slowly advance your diet thereafter. Call us if you have persistent vomiting or regurgitation, night cough or reflux symptoms. Return as scheduled or sooner if you notice no changes in hunger/portion sizes.  

## 2012-12-30 ENCOUNTER — Encounter (INDEPENDENT_AMBULATORY_CARE_PROVIDER_SITE_OTHER): Payer: Self-pay | Admitting: General Surgery

## 2012-12-30 ENCOUNTER — Ambulatory Visit (INDEPENDENT_AMBULATORY_CARE_PROVIDER_SITE_OTHER): Payer: BC Managed Care – PPO | Admitting: General Surgery

## 2012-12-30 ENCOUNTER — Telehealth (INDEPENDENT_AMBULATORY_CARE_PROVIDER_SITE_OTHER): Payer: Self-pay

## 2012-12-30 VITALS — BP 154/90 | HR 88 | Temp 97.6°F | Resp 16 | Ht 66.0 in | Wt 253.2 lb

## 2012-12-30 DIAGNOSIS — Z9884 Bariatric surgery status: Secondary | ICD-10-CM

## 2012-12-30 NOTE — Patient Instructions (Addendum)
Stay on a liquid diet over the weekend and then cautiously resume solid foods. Keep your appointment for 4 weeks or call if you are still having difficulty

## 2012-12-30 NOTE — Progress Notes (Signed)
History: Patient is status post lap band placement in September of 2005. She developed esophageal dilatation and all fluid was removed in June of 2013. She had her initial refill yesterday and 1 cc was added to the band. Since that time she has been unable to tolerate liquids with immediate regurgitation. Her swallow last year showed esophageal dilatation but apparently no evidence of slip.  Exam: BP 154/90  Pulse 88  Temp(Src) 97.6 F (36.4 C) (Temporal)  Resp 16  Ht 5\' 6"  (1.676 m)  Wt 253 lb 3.2 oz (114.851 kg)  BMI 40.89 kg/m2 General: Obese female in no distress Abdomen: Port site unremarkable  Assessment and plan: We've this information we removed 0.5 cc or half a fill she received yesterday. Following this she noted a definite difference and was able to tolerate water without difficulty. She will stay on liquids over the weekend and then gradually resume her solid diet. She has followup in one month.

## 2012-12-30 NOTE — Telephone Encounter (Signed)
I talked to Dr Luisa Hart who is willing to see the pt at 11:50 this am.  He says he will take out all the fluid because he doesn't want her to end back up in the ER this weekend.  He said to make sure she knows. I informed the pt of this.  She decided she would like to wait and see Dr Johna Sheriff.  I informed her he is coming over from the or and could be delayed, etc.  I will inform her of any updates.

## 2012-12-30 NOTE — Telephone Encounter (Signed)
The pt called in after her fill yesterday and she is vomiting.  Mardelle Matte put 1 cc in.  She would like a little fluid removed.  I paged Dr Johna Sheriff who is the only bariatric doctor available.  He is in the or today but may be able to see her at 2 pm if no one in the office can.

## 2013-01-26 ENCOUNTER — Encounter (INDEPENDENT_AMBULATORY_CARE_PROVIDER_SITE_OTHER): Payer: BC Managed Care – PPO

## 2013-02-02 ENCOUNTER — Encounter (INDEPENDENT_AMBULATORY_CARE_PROVIDER_SITE_OTHER): Payer: BC Managed Care – PPO

## 2013-12-19 ENCOUNTER — Other Ambulatory Visit: Payer: Self-pay | Admitting: Family Medicine

## 2013-12-19 ENCOUNTER — Other Ambulatory Visit (HOSPITAL_COMMUNITY)
Admission: RE | Admit: 2013-12-19 | Discharge: 2013-12-19 | Disposition: A | Discharge status: Home / Self Care | Payer: Managed Care, Other (non HMO) | Source: Ambulatory Visit | Attending: Family Medicine | Admitting: Family Medicine

## 2013-12-19 DIAGNOSIS — Z124 Encounter for screening for malignant neoplasm of cervix: Secondary | ICD-10-CM | POA: Insufficient documentation

## 2013-12-19 DIAGNOSIS — Z1151 Encounter for screening for human papillomavirus (HPV): Secondary | ICD-10-CM | POA: Insufficient documentation

## 2015-07-04 ENCOUNTER — Emergency Department (HOSPITAL_COMMUNITY): Payer: BLUE CROSS/BLUE SHIELD

## 2015-07-04 ENCOUNTER — Emergency Department (HOSPITAL_COMMUNITY)
Admission: EM | Admit: 2015-07-04 | Discharge: 2015-07-05 | Disposition: A | Discharge status: Home / Self Care | Payer: BLUE CROSS/BLUE SHIELD | Attending: Emergency Medicine | Admitting: Emergency Medicine

## 2015-07-04 ENCOUNTER — Encounter (HOSPITAL_COMMUNITY): Payer: Self-pay | Admitting: *Deleted

## 2015-07-04 DIAGNOSIS — D649 Anemia, unspecified: Secondary | ICD-10-CM | POA: Diagnosis not present

## 2015-07-04 DIAGNOSIS — R140 Abdominal distension (gaseous): Secondary | ICD-10-CM | POA: Diagnosis not present

## 2015-07-04 DIAGNOSIS — R079 Chest pain, unspecified: Secondary | ICD-10-CM | POA: Diagnosis present

## 2015-07-04 DIAGNOSIS — Z79899 Other long term (current) drug therapy: Secondary | ICD-10-CM | POA: Insufficient documentation

## 2015-07-04 DIAGNOSIS — J45901 Unspecified asthma with (acute) exacerbation: Secondary | ICD-10-CM | POA: Insufficient documentation

## 2015-07-04 DIAGNOSIS — F419 Anxiety disorder, unspecified: Secondary | ICD-10-CM | POA: Insufficient documentation

## 2015-07-04 DIAGNOSIS — F909 Attention-deficit hyperactivity disorder, unspecified type: Secondary | ICD-10-CM | POA: Insufficient documentation

## 2015-07-04 DIAGNOSIS — Z87891 Personal history of nicotine dependence: Secondary | ICD-10-CM | POA: Diagnosis not present

## 2015-07-04 DIAGNOSIS — R5383 Other fatigue: Secondary | ICD-10-CM | POA: Diagnosis not present

## 2015-07-04 DIAGNOSIS — R1110 Vomiting, unspecified: Secondary | ICD-10-CM | POA: Insufficient documentation

## 2015-07-04 DIAGNOSIS — K219 Gastro-esophageal reflux disease without esophagitis: Secondary | ICD-10-CM | POA: Diagnosis not present

## 2015-07-04 DIAGNOSIS — Z9981 Dependence on supplemental oxygen: Secondary | ICD-10-CM | POA: Diagnosis not present

## 2015-07-04 DIAGNOSIS — G4730 Sleep apnea, unspecified: Secondary | ICD-10-CM | POA: Diagnosis not present

## 2015-07-04 LAB — COMPREHENSIVE METABOLIC PANEL
ALT: 21 U/L (ref 14–54)
AST: 25 U/L (ref 15–41)
Albumin: 4 g/dL (ref 3.5–5.0)
Alkaline Phosphatase: 70 U/L (ref 38–126)
Anion gap: 11 (ref 5–15)
BUN: 16 mg/dL (ref 6–20)
CO2: 21 mmol/L — ABNORMAL LOW (ref 22–32)
Calcium: 9.3 mg/dL (ref 8.9–10.3)
Chloride: 104 mmol/L (ref 101–111)
Creatinine, Ser: 0.79 mg/dL (ref 0.44–1.00)
GFR calc Af Amer: >60 mL/min (ref >60)
GFR calc non Af Amer: >60 mL/min (ref >60)
Glucose, Bld: 96 mg/dL (ref 65–99)
Potassium: 4.2 mmol/L (ref 3.5–5.1)
Sodium: 136 mmol/L (ref 135–145)
Total Bilirubin: 0.5 mg/dL (ref 0.3–1.2)
Total Protein: 6.6 g/dL (ref 6.5–8.1)

## 2015-07-04 LAB — URINALYSIS, ROUTINE W REFLEX MICROSCOPIC
Bilirubin Urine: NEGATIVE
Glucose, UA: NEGATIVE mg/dL
Ketones, ur: NEGATIVE mg/dL
Leukocytes, UA: NEGATIVE
Nitrite: NEGATIVE
Protein, ur: NEGATIVE mg/dL
Specific Gravity, Urine: 1.024 (ref 1.005–1.030)
Urobilinogen, UA: 0.2 mg/dL (ref 0.0–1.0)
pH: 5 (ref 5.0–8.0)

## 2015-07-04 LAB — CBC
HCT: 40.3 % (ref 36.0–46.0)
Hemoglobin: 13.8 g/dL (ref 12.0–15.0)
MCH: 31.4 pg (ref 26.0–34.0)
MCHC: 34.2 g/dL (ref 30.0–36.0)
MCV: 91.8 fL (ref 78.0–100.0)
Platelets: 276 10*3/uL (ref 150–400)
RBC: 4.39 MIL/uL (ref 3.87–5.11)
RDW: 12.3 % (ref 11.5–15.5)
WBC: 7.1 10*3/uL (ref 4.0–10.5)

## 2015-07-04 LAB — URINE MICROSCOPIC-ADD ON

## 2015-07-04 LAB — LIPASE, BLOOD: Lipase: 37 U/L (ref 22–51)

## 2015-07-04 LAB — I-STAT TROPONIN, ED: Troponin i, poc: 0 ng/mL (ref 0.00–0.08)

## 2015-07-04 MED ORDER — IOHEXOL 300 MG/ML  SOLN
100.0000 mL | Freq: Once | INTRAMUSCULAR | Status: AC | PRN
  Administered 2015-07-04: 100 mL via INTRAVENOUS

## 2015-07-04 MED ORDER — ALUM & MAG HYDROXIDE-SIMETH 200-200-20 MG/5ML PO SUSP
15.0000 mL | Freq: Once | ORAL | Status: AC
  Administered 2015-07-04: 15 mL via ORAL
  Filled 2015-07-04: qty 30

## 2015-07-04 MED ORDER — IOHEXOL 300 MG/ML  SOLN
25.0000 mL | Freq: Once | INTRAMUSCULAR | Status: AC | PRN
  Administered 2015-07-04: 25 mL via ORAL

## 2015-07-04 NOTE — ED Notes (Signed)
Pt in radiology 

## 2015-07-04 NOTE — ED Notes (Signed)
Pt is here with intermittent chest pain for one week, states vomiting in the am and at night, and states fatigue today.  Pt feels like abdomen has been distended all day.

## 2015-07-04 NOTE — ED Provider Notes (Signed)
CSN: [id number]     Arrival date & time 07/04/15  1704 History   First MD Initiated Contact with Patient 07/04/15 1725     Chief Complaint  Patient presents with  . Chest Pain  . Bloated     (Consider location/radiation/quality/duration/timing/severity/associated sxs/prior Treatment) Patient is a 59 y.o. female presenting with chest pain and abdominal pain.  Chest Pain Pain location:  L chest Pain quality: pressure   Pain radiates to:  Does not radiate Pain severity:  Mild Onset quality:  Sudden Timing:  Intermittent Progression:  Unchanged Chronicity:  New Context comment:  No specific context to associated SOB. Relieved by:  Nothing Associated symptoms: abdominal pain, anxiety, fatigue, shortness of breath and vomiting   Associated symptoms: no anorexia, no cough, no fever and no nausea   Abdominal Pain Pain location:  RLQ and RUQ Pain quality: bloating   Pain radiates to:  Does not radiate Pain severity:  Moderate Onset quality:  Gradual Timing:  Constant Chronicity:  New Context: previous surgery   Context: not diet changes, not eating, not recent illness, not recent travel, not sick contacts and not suspicious food intake   Associated symptoms: chest pain, fatigue, shortness of breath and vomiting   Associated symptoms: no anorexia, no cough, no dysuria, no fever, no nausea and no vaginal bleeding     Past Medical History  Diagnosis Date  . Allergy     seasonal  . Asthma   . GERD (gastroesophageal reflux disease)     Barrett's  . Anxiety   . ADD (attention deficit disorder)   . Complication of anesthesia     can be difficult to wake up/need prompts to breathe  . Sleep apnea     Uses c pap, but sleep apnea resolved after surgery  . Anemia    Past Surgical History  Procedure Laterality Date  . Abdominoplasty  01/2009  . Laparoscopic gastric banding      05/2004  . Knee arthroscopy      left  . Uvulopalatopharyngoplasty    . Adenoidectomy    .  Colonoscopy    . Polypectomy      post polyp bleed  . Upper gastrointestinal endoscopy      Barrett's  . Tonsillectomy    . Seroma    . Abdominal debridement  05/26/2012   Family History  Problem Relation Age of Onset  . Colon cancer Neg Hx   . Stomach cancer Neg Hx    Social History  Substance Use Topics  . Smoking status: Former Smoker    Quit date: 11/30/1993  . Smokeless tobacco: Never Used  . Alcohol Use: No   OB History    No data available     Review of Systems  Constitutional: Positive for fatigue. Negative for fever.  Respiratory: Positive for shortness of breath. Negative for cough.   Cardiovascular: Positive for chest pain.  Gastrointestinal: Positive for vomiting and abdominal pain. Negative for nausea and anorexia.  Genitourinary: Negative for dysuria, decreased urine volume and vaginal bleeding.       Increase in urinary incontinence  Psychiatric/Behavioral: Negative for confusion and agitation. The patient is not nervous/anxious.   All other systems reviewed and are negative.  Allergies  Review of patient's allergies indicates no known allergies.  Home Medications   Prior to Admission medications   Medication Sig Start Date End Date Taking? Authorizing Provider  ALPRAZolam Prudy Feeler) 0.5 MG tablet Take 0.5 mg by mouth daily as needed. For anxiety  Historical Provider, MD  Azelastine HCl (ASTEPRO) 0.15 % SOLN Place 1 drop into the nose 2 (two) times daily as needed. For allergies    Historical Provider, MD  budesonide-formoterol (SYMBICORT) 160-4.5 MCG/ACT inhaler Inhale 2 puffs into the lungs 2 (two) times daily as needed. For wheezing    Historical Provider, MD  FLUoxetine (PROZAC) 20 MG capsule Take 20 mg by mouth daily.      Historical Provider, MD  levocetirizine (XYZAL) 5 MG tablet Take 2.5 mg by mouth every evening.    Historical Provider, MD  lisdexamfetamine (VYVANSE) 60 MG capsule Take 60 mg by mouth every morning.     Historical Provider, MD   montelukast (SINGULAIR) 10 MG tablet  07/17/12   Historical Provider, MD  Multiple Vitamins-Minerals (MULTIVITAMIN WITH MINERALS) tablet Take 1 tablet by mouth daily.      Historical Provider, MD  OMEGA 3 1000 MG CAPS Take 1,000 mg by mouth daily.     Historical Provider, MD  omeprazole (PRILOSEC) 40 MG capsule Take 1 capsule (40 mg total) by mouth 2 (two) times daily. 10/14/12 10/14/13  Hart Carwin, MD  zolpidem (AMBIEN) 10 MG tablet Take 10 mg by mouth at bedtime as needed. For sleep    Historical Provider, MD   BP 139/88 mmHg  Pulse 84  Temp(Src) 98 F (36.7 C) (Oral)  Resp 18  SpO2 94% Physical Exam  Constitutional: She is oriented to person, place, and time. She appears well-developed and well-nourished. No distress.  HENT:  Head: Normocephalic.  Eyes: Pupils are equal, round, and reactive to light.  Neck: Normal range of motion. Neck supple.  Cardiovascular: Normal rate.   No murmur heard. Pulmonary/Chest: Effort normal. No respiratory distress. She has no wheezes.  Abdominal: Soft. She exhibits no mass. There is no rebound and no guarding.  Grand abdomen with pannus. No tenderness to palpation. No mass felt. No rebound or guarding.   Musculoskeletal: Normal range of motion.  Trace bilateral edema  Neurological: She is alert and oriented to person, place, and time. No cranial nerve deficit. She exhibits normal muscle tone. Coordination normal.  Skin: Skin is warm and dry. No rash noted. She is not diaphoretic. No erythema.  Psychiatric: She has a normal mood and affect. Her behavior is normal. Judgment and thought content normal.    ED Course  Procedures (including critical care time) Labs Review Labs Reviewed  COMPREHENSIVE METABOLIC PANEL - Abnormal; Notable for the following:    CO2 21 (*)    All other components within normal limits  URINALYSIS, ROUTINE W REFLEX MICROSCOPIC (NOT AT [facility]) - Abnormal; Notable for the following:    APPearance CLOUDY (*)    Hgb urine  dipstick TRACE (*)    All other components within normal limits  URINE MICROSCOPIC-ADD ON - Abnormal; Notable for the following:    Bacteria, UA FEW (*)    All other components within normal limits  LIPASE, BLOOD  CBC  TSH  I-STAT TROPOININ, ED  I-STAT TROPOININ, ED    Imaging Review Dg Chest 2 View  07/04/2015   CLINICAL DATA:  Chest pressure and short of breath for 1 week  EXAM: CHEST  2 VIEW  COMPARISON:  05/20/2012  FINDINGS: Normal heart size. Calcified granuloma in the right upper lobe. No consolidation or mass. No pneumothorax or pleural effusion.  IMPRESSION: No active cardiopulmonary disease.   Electronically Signed   By: Jolaine Click M.D.   On: 07/04/2015 17:41   Ct Abdomen Pelvis  W Contrast  07/04/2015   CLINICAL DATA:  PT HAS H/O LAP BAND SURGERY AND ABDOMINOPLASTY HAS HAD LOTS OF PROBLEMS SINCE THEN TODAY HAS EPIGASTRIC PAIN W/ BLOATING, TIGHTNESS AND SWELLING NOT DIABETIC  EXAM: CT ABDOMEN AND PELVIS WITH CONTRAST  TECHNIQUE: Multidetector CT imaging of the abdomen and pelvis was performed using the standard protocol following bolus administration of intravenous contrast.  CONTRAST:  OMNIPAQUE IOHEXOL 300 MG/ML  SOLN  COMPARISON:  12/04/2011  FINDINGS: Visualized lung bases clear. Gastric lap band apparatus projects in expected location. Unremarkable spleen, liver, gallbladder, adrenal glands, kidneys, pancreas. Mild aortoiliac arterial plaque without aneurysm or stenosis. Small hiatal hernia. Small bowel and colon are nondilated. A few scattered descending and sigmoid diverticula without adjacent inflammatory/edematous change. Uterus and adnexal regions unremarkable. Urinary bladder incompletely distended. Bilateral pelvic phleboliths. No ascites. No free air. No adenopathy localized. Degenerative disc disease L5-S1 and facet DJD L3-S1.  IMPRESSION: 1. No acute abdominal process. 2. Postop changes of gastric lap band placement without apparent complication. 3. Descending and  sigmoid diverticulosis.   Electronically Signed   By: Corlis Leak M.D.   On: 07/04/2015 21:22   I have personally reviewed and evaluated these images and lab results as part of my medical decision-making.   EKG Interpretation   Date/Time:  Thursday July 04 2015 17:09:58 EDT Ventricular Rate:  85 PR Interval:  158 QRS Duration: 80 QT Interval:  376 QTC Calculation: 447 R Axis:   -4 Text Interpretation:  Normal sinus rhythm Septal infarct , age  undetermined Abnormal ECG Confirmed by Lincoln Brigham [phone]) on 07/04/2015  5:34:30 PM      MDM   Ms. Batt presents to the ED with multiple complaints including abdominal distention/discomfort today and fatigue.  Patient states that this abdominal distention feels like she is bloated but denies any specific type of pain. Patient has been having no changes in stool continues to have normal bowel movements, continues to have flatus, and denies any vaginal bleeding vaginal discharge or dysuria. Patient does endorse occasional episodes of vomiting over the past few days but denies any hematemesis. Her chest pain has been ongoing intermittently for the past week and is not associated with exertion, position or food. Patient states she has occasional shortness of breath but that this is not necessarily associated with chest pain. Patient denies any nausea or diaphoresis with this pain. She denies any history of hypertension, hyperlipidemia, diabetes, or smoking history.  Patient had CT abdomen due to her previous surgery. CT is unremarkable for any acute changes. Given her lack of risk factors, HEART score of 3, patient had delta troponins which were negative. Patient has low well's   Final diagnoses:  Abdominal bloating  Chest pain, unspecified chest pain type    Deirdre Peer, MD 07/05/15 6045  Marily Memos, MD 07/07/15 [phone]

## 2015-07-05 LAB — I-STAT TROPONIN, ED: Troponin i, poc: 0 ng/mL (ref 0.00–0.08)

## 2015-07-05 NOTE — Discharge Instructions (Signed)
Abdominal Pain, Adult °Many things can cause abdominal pain. Usually, abdominal pain is not caused by a disease and will improve without treatment. It can often be observed and treated at home. Your health care provider will do a physical exam and possibly order blood tests and X-rays to help determine the seriousness of your pain. However, in many cases, more time must pass before a clear cause of the pain can be found. Before that point, your health care provider may not know if you need more testing or further treatment. °HOME CARE INSTRUCTIONS °Monitor your abdominal pain for any changes. The following actions may help to alleviate any discomfort you are experiencing: °· Only take over-the-counter or prescription medicines as directed by your health care provider. °· Do not take laxatives unless directed to do so by your health care provider. °· Try a clear liquid diet (broth, tea, or water) as directed by your health care provider. Slowly move to a bland diet as tolerated. °SEEK MEDICAL CARE IF: °· You have unexplained abdominal pain. °· You have abdominal pain associated with nausea or diarrhea. °· You have pain when you urinate or have a bowel movement. °· You experience abdominal pain that wakes you in the night. °· You have abdominal pain that is worsened or improved by eating food. °· You have abdominal pain that is worsened with eating fatty foods. °· You have a fever. °SEEK IMMEDIATE MEDICAL CARE IF: °· Your pain does not go away within 2 hours. °· You keep throwing up (vomiting). °· Your pain is felt only in portions of the abdomen, such as the right side or the left lower portion of the abdomen. °· You pass bloody or black tarry stools. °MAKE SURE YOU: °· Understand these instructions. °· Will watch your condition. °· Will get help right away if you are not doing well or get worse. °  °This information is not intended to replace advice given to you by your health care provider. Make sure you discuss  any questions you have with your health care provider. °  °Document Released: 06/24/2005 Document Revised: 06/05/2015 Document Reviewed: 05/24/2013 °Elsevier Interactive Patient Education ©2016 [employer] °Nonspecific Chest Pain  °Chest pain can be caused by many different conditions. There is always a chance that your pain could be related to something serious, such as a heart attack or a blood clot in your lungs. Chest pain can also be caused by conditions that are not life-threatening. If you have chest pain, it is very important to follow up with your health care provider. °CAUSES  °Chest pain can be caused by: °· Heartburn. °· Pneumonia or bronchitis. °· Anxiety or stress. °· Inflammation around your heart (pericarditis) or lung (pleuritis or pleurisy). °· A blood clot in your lung. °· A collapsed lung (pneumothorax). It can develop suddenly on its own (spontaneous pneumothorax) or from trauma to the chest. °· Shingles infection (varicella-zoster virus). °· Heart attack. °· Damage to the bones, muscles, and cartilage that make up your chest wall. This can include: °· Bruised bones due to injury. °· Strained muscles or cartilage due to frequent or repeated coughing or overwork. °· Fracture to one or more ribs. °· Sore cartilage due to inflammation (costochondritis). °RISK FACTORS  °Risk factors for chest pain may include: °· Activities that increase your risk for trauma or injury to your chest. °· Respiratory infections or conditions that cause frequent coughing. °· Medical conditions or overeating that can cause heartburn. °· Heart disease or family history   of heart disease. °· Conditions or health behaviors that increase your risk of developing a blood clot. °· Having had chicken pox (varicella zoster). °SIGNS AND SYMPTOMS °Chest pain can feel like: °· Burning or tingling on the surface of your chest or deep in your chest. °· Crushing, pressure, aching, or squeezing pain. °· Dull or sharp pain that is worse  when you move, cough, or take a deep breath. °· Pain that is also felt in your back, neck, shoulder, or arm, or pain that spreads to any of these areas. °Your chest pain may come and go, or it may stay constant. °DIAGNOSIS °Lab tests or other studies may be needed to find the cause of your pain. Your health care provider may have you take a test called an ambulatory ECG (electrocardiogram). An ECG records your heartbeat patterns at the time the test is performed. You may also have other tests, such as: °· Transthoracic echocardiogram (TTE). During echocardiography, sound waves are used to create a picture of all of the heart structures and to look at how blood flows through your heart. °· Transesophageal echocardiogram (TEE). This is a more advanced imaging test that obtains images from inside your body. It allows your health care provider to see your heart in finer detail. °· Cardiac monitoring. This allows your health care provider to monitor your heart rate and rhythm in real time. °· Holter monitor. This is a portable device that records your heartbeat and can help to diagnose abnormal heartbeats. It allows your health care provider to track your heart activity for several days, if needed. °· Stress tests. These can be done through exercise or by taking medicine that makes your heart beat more quickly. °· Blood tests. °· Imaging tests. °TREATMENT  °Your treatment depends on what is causing your chest pain. Treatment may include: °· Medicines. These may include: °¨ Acid blockers for heartburn. °¨ Anti-inflammatory medicine. °¨ Pain medicine for inflammatory conditions. °¨ Antibiotic medicine, if an infection is present. °¨ Medicines to dissolve blood clots. °¨ Medicines to treat coronary artery disease. °· Supportive care for conditions that do not require medicines. This may include: °¨ Resting. °¨ Applying heat or cold packs to injured areas. °¨ Limiting activities until pain decreases. °HOME CARE  INSTRUCTIONS °· If you were prescribed an antibiotic medicine, finish it all even if you start to feel better. °· Avoid any activities that bring on chest pain. °· Do not use any tobacco products, including cigarettes, chewing tobacco, or electronic cigarettes. If you need help quitting, ask your health care provider. °· Do not drink alcohol. °· Take medicines only as directed by your health care provider. °· Keep all follow-up visits as directed by your health care provider. This is important. This includes any further testing if your chest pain does not go away. °· If heartburn is the cause for your chest pain, you may be told to keep your head raised (elevated) while sleeping. This reduces the chance that acid will go from your stomach into your esophagus. °· Make lifestyle changes as directed by your health care provider. These may include: °¨ Getting regular exercise. Ask your health care provider to suggest some activities that are safe for you. °¨ Eating a heart-healthy diet. A registered dietitian can help you to learn healthy eating options. °¨ Maintaining a healthy weight. °¨ Managing diabetes, if necessary. °¨ Reducing stress. °SEEK MEDICAL CARE IF: °· Your chest pain does not go away after treatment. °· You have a rash with blisters   on your chest. °· You have a fever. °SEEK IMMEDIATE MEDICAL CARE IF:  °· Your chest pain is worse. °· You have an increasing cough, or you cough up blood. °· You have severe abdominal pain. °· You have severe weakness. °· You faint. °· You have chills. °· You have sudden, unexplained chest discomfort. °· You have sudden, unexplained discomfort in your arms, back, neck, or jaw. °· You have shortness of breath at any time. °· You suddenly start to sweat, or your skin gets clammy. °· You feel nauseous or you vomit. °· You suddenly feel light-headed or dizzy. °· Your heart begins to beat quickly, or it feels like it is skipping beats. °These symptoms may represent a serious  problem that is an emergency. Do not wait to see if the symptoms will go away. Get medical help right away. Call your local emergency services (911 in the [place] Do not drive yourself to the hospital. °  °This information is not intended to replace advice given to you by your health care provider. Make sure you discuss any questions you have with your health care provider. °  °Document Released: 06/24/2005 Document Revised: 10/05/2014 Document Reviewed: 04/20/2014 °Elsevier Interactive Patient Education ©2016 [employer] ° °

## 2015-07-05 NOTE — ED Notes (Signed)
Pt verbalized understanding of d/c instructions and has no further questions. Pt stable and NAD.  

## 2015-12-31 DIAGNOSIS — H26491 Other secondary cataract, right eye: Secondary | ICD-10-CM | POA: Diagnosis not present

## 2015-12-31 DIAGNOSIS — H25812 Combined forms of age-related cataract, left eye: Secondary | ICD-10-CM | POA: Diagnosis not present

## 2015-12-31 DIAGNOSIS — H40013 Open angle with borderline findings, low risk, bilateral: Secondary | ICD-10-CM | POA: Diagnosis not present

## 2016-01-07 DIAGNOSIS — R05 Cough: Secondary | ICD-10-CM | POA: Diagnosis not present

## 2016-01-07 DIAGNOSIS — J387 Other diseases of larynx: Secondary | ICD-10-CM | POA: Diagnosis not present

## 2016-01-07 DIAGNOSIS — G4733 Obstructive sleep apnea (adult) (pediatric): Secondary | ICD-10-CM | POA: Diagnosis not present

## 2016-01-07 DIAGNOSIS — R490 Dysphonia: Secondary | ICD-10-CM | POA: Diagnosis not present

## 2016-01-22 DIAGNOSIS — F900 Attention-deficit hyperactivity disorder, predominantly inattentive type: Secondary | ICD-10-CM | POA: Diagnosis not present

## 2016-02-14 DIAGNOSIS — R200 Anesthesia of skin: Secondary | ICD-10-CM | POA: Diagnosis not present

## 2016-02-14 DIAGNOSIS — M79659 Pain in unspecified thigh: Secondary | ICD-10-CM | POA: Diagnosis not present

## 2016-02-26 DIAGNOSIS — J301 Allergic rhinitis due to pollen: Secondary | ICD-10-CM | POA: Diagnosis not present

## 2016-02-26 DIAGNOSIS — J4530 Mild persistent asthma, uncomplicated: Secondary | ICD-10-CM | POA: Diagnosis not present

## 2016-03-09 DIAGNOSIS — M25561 Pain in right knee: Secondary | ICD-10-CM | POA: Diagnosis not present

## 2016-03-09 DIAGNOSIS — M7121 Synovial cyst of popliteal space [Baker], right knee: Secondary | ICD-10-CM | POA: Diagnosis not present

## 2016-03-23 DIAGNOSIS — M25561 Pain in right knee: Secondary | ICD-10-CM | POA: Diagnosis not present

## 2016-03-23 DIAGNOSIS — M7121 Synovial cyst of popliteal space [Baker], right knee: Secondary | ICD-10-CM | POA: Diagnosis not present

## 2016-03-23 DIAGNOSIS — M1711 Unilateral primary osteoarthritis, right knee: Secondary | ICD-10-CM | POA: Diagnosis not present

## 2016-03-26 ENCOUNTER — Ambulatory Visit: Payer: BLUE CROSS/BLUE SHIELD | Admitting: Neurology

## 2016-04-01 ENCOUNTER — Ambulatory Visit (INDEPENDENT_AMBULATORY_CARE_PROVIDER_SITE_OTHER): Payer: BLUE CROSS/BLUE SHIELD | Admitting: Neurology

## 2016-04-01 ENCOUNTER — Encounter: Payer: Self-pay | Admitting: Neurology

## 2016-04-01 VITALS — BP 133/88 | HR 82 | Ht 66.0 in | Wt 234.4 lb

## 2016-04-01 DIAGNOSIS — R202 Paresthesia of skin: Secondary | ICD-10-CM | POA: Insufficient documentation

## 2016-04-01 DIAGNOSIS — M5417 Radiculopathy, lumbosacral region: Secondary | ICD-10-CM | POA: Diagnosis not present

## 2016-04-01 DIAGNOSIS — G5712 Meralgia paresthetica, left lower limb: Secondary | ICD-10-CM

## 2016-04-01 DIAGNOSIS — G5722 Lesion of femoral nerve, left lower limb: Secondary | ICD-10-CM

## 2016-04-01 DIAGNOSIS — G5603 Carpal tunnel syndrome, bilateral upper limbs: Secondary | ICD-10-CM | POA: Diagnosis not present

## 2016-04-01 DIAGNOSIS — S301XXA Contusion of abdominal wall, initial encounter: Secondary | ICD-10-CM

## 2016-04-01 DIAGNOSIS — R29898 Other symptoms and signs involving the musculoskeletal system: Secondary | ICD-10-CM

## 2016-04-01 MED ORDER — LIDOCAINE 5 % EX PTCH: 3.0000 | MEDICATED_PATCH | CUTANEOUS | Status: DC

## 2016-04-01 NOTE — Progress Notes (Signed)
[facility]    Provider:  Dr Lucia GaskinsAhern Referring Provider: , Dr. Hyman HopesWebb  Primary Care Physician:  Frederich ChickWEBB, CAROL D, MD  CC:  Thigh numbness  HPI:  Terri [name] [name] is a 60 y.o. female here as a referral from Dr. Hyman HopesWebb for leg thigh numbness. Past medical history of obstructive sleep apnea, obesity, high triglycerides, postinfectious asthma symptoms, LAP-BAND procedure, ADD, GERD, former smoker quit in 1990.Thigh pain started a year ago worsening 6 months ago. It is burning pain, can be severe, not positional. The burning pain is in the thigh. Sometimes she feels it radiating to the foot and her foot can be cold. She has had a lap band in 2005. She has maintained her weight loss. No back pain. She has started getting back in the water. Since she had a baker cyst on the right knee she has using her left leg more and feels weaker on the left left leg. Topical agents make the symptoms better. Burning is continuous. Makes her irritable, worse with being on her feet long periods. She sometimes has some lateral calf numbness and the top of the foot feels cold and tingly. Having tingling and numbness and burning in the fingers. She is dropping things. She had abdominoplasty as well. She had a laparascopic removal of Seroma in the past. No neck pain. No other focal neurologic deficits.   Reviewed notes, labs and imaging from outside physicians, which showed: [facility]. She is a 60 year old female with left thigh burning, tingling for the last 6-8 months. Worsening, more intense. No pain in her buttocks pain. Possible meralgia paresthetica. Patient reported the left leg is warm to touch, feels hot, skin numbness, pain, tingling and burning sensation in her thigh. Starts in her thigh and goes down to her ankle but denies low back pain or buttock pain. She did have poison ivy at the site 20-30 years ago. Not wearing tight clothing. No fever, some tingling in the hands, past suspicion  for carpal tunnel syndrome. No leg edema, calf pain, redness, chest pain or dyspnea. No history of DVT or bedrest. Exam was normal including general, head, neck, lungs, heart, musculoskeletal, back, extremities. Neurologic exam was significant for decreased sensation to light touch in the left lateral thigh area compared to the right otherwise normal. Gabapentin was started 300 mg titrated to 3 times a day.  Review of Systems: Patient complains of symptoms per HPI as well as the following symptoms: Joint pain, numbness, weakness, anxiety. Pertinent negatives per HPI. All others negative.   Social History   Social History  . Marital Status: Married    Spouse Name: Leonette MostCharles "Chip"  . Number of Children: 0  . Years of Education: 16   Occupational History  . Self-employed    Social History Main Topics  . Smoking status: Former Smoker    Quit date: 11/30/1993  . Smokeless tobacco: Never Used  . Alcohol Use: 0.0 oz/week    0 Standard drinks or equivalent per week     Comment: 1-2/week  . Drug Use: No  . Sexual Activity: Not on file   Other Topics Concern  . Not on file   Social History Narrative   Lives with husband, Chip   Caffeine use: Daily coffee    Family History  Problem Relation Age of Onset  . Colon cancer Neg Hx   . Stomach cancer Neg Hx   . Diabetes Mother   . Depression Mother   . Heart attack Mother   .  Heart Problems Father     Past Medical History  Diagnosis Date  . Allergy     seasonal  . Asthma   . GERD (gastroesophageal reflux disease)     Barrett's  . Anxiety   . ADD (attention deficit disorder)   . Complication of anesthesia     can be difficult to wake up/need prompts to breathe  . Sleep apnea     Uses c pap, but sleep apnea resolved after surgery  . Anemia   . Obesity     Past Surgical History  Procedure Laterality Date  . Abdominoplasty  01/2009  . Laparoscopic gastric banding      05/2004  . Knee arthroscopy      left  .  Uvulopalatopharyngoplasty    . Adenoidectomy    . Colonoscopy    . Polypectomy      post polyp bleed  . Upper gastrointestinal endoscopy      Barrett's  . Tonsillectomy    . Seroma    . Abdominal debridement  05/26/2012    Current Outpatient Prescriptions  Medication Sig Dispense Refill  . budesonide-formoterol (SYMBICORT) 160-4.5 MCG/ACT inhaler Inhale 2 puffs into the lungs 2 (two) times daily as needed. For wheezing    . clindamycin (CLEOCIN) 150 MG capsule Take 150 mg by mouth 4 (four) times daily. For possible infection in her tooth    . levocetirizine (XYZAL) 5 MG tablet Take 2.5 mg by mouth every evening.    . lisdexamfetamine (VYVANSE) 60 MG capsule Take 60 mg by mouth every morning.     . montelukast (SINGULAIR) 10 MG tablet Take 10 mg by mouth every evening.     . sertraline (ZOLOFT) 50 MG tablet Take 50 mg by mouth daily.    Marland Kitchen. zolpidem (AMBIEN) 10 MG tablet Take 10 mg by mouth at bedtime as needed. For sleep    . lidocaine (LIDODERM) 5 % Place 3 patches onto the skin daily. Remove & Discard patch within 12 hours or as directed by MD 90 patch 11  . omeprazole (PRILOSEC) 40 MG capsule Take 1 capsule (40 mg total) by mouth 2 (two) times daily. 180 capsule 6   No current facility-administered medications for this visit.    Allergies as of 04/01/2016  . (No Known Allergies)    Vitals: BP 133/88 mmHg  Pulse 82  Ht 5\' 6"  (1.676 m)  Wt 234 lb 6.4 oz (106.323 kg)  BMI 37.85 kg/m2 Last Weight:  Wt Readings from Last 1 Encounters:  04/01/16 234 lb 6.4 oz (106.323 kg)   Last Height:   Ht Readings from Last 1 Encounters:  04/01/16 5\' 6"  (1.676 m)   Physical exam: Exam: Gen: NAD, conversant, well nourised, obese, well groomed                     CV: RRR, no MRG. No Carotid Bruits. No peripheral edema, warm, nontender Eyes: Conjunctivae clear without exudates or hemorrhage  Neuro: Detailed Neurologic Exam  Speech:    Speech is normal; fluent and spontaneous with  normal comprehension.  Cognition:    The patient is oriented to person, place, and time;     recent and remote memory intact;     language fluent;     normal attention, concentration,     fund of knowledge Cranial Nerves:    The pupils are equal, round, and reactive to light. The fundi are normal and spontaneous venous pulsations are present. Visual fields are full  to finger confrontation. Extraocular movements are intact. Trigeminal sensation is intact and the muscles of mastication are normal. The face is symmetric. The palate elevates in the midline. Hearing intact. Voice is normal. Shoulder shrug is normal. The tongue has normal motion without fasciculations.   Coordination:    Normal finger to nose and heel to shin. Normal rapid alternating movements.   Gait:    Heel-toe gait are normal.   Motor Observation:    No asymmetry, no atrophy, and no involuntary movements noted. Tone:    Normal muscle tone.    Posture:    Posture is normal. normal erect    Strength: left hip flexion and leg flexion 4/5.     Strength is V/V in the upper and lower limbs.      Sensation: intact distally , decreased in the left thigh in the distribution of the lateral femoral cutaneous nerve     Reflex Exam:  DTR's: decreased let patellar reflex.     Deep tendon reflexes in the upper and lower extremities are normal bilaterally.   Toes:    The toes are downgoing bilaterally.   Clonus:    Clonus is absent.     ssessment/Plan:   60 y.o. Lovely female here as a referral from Dr. Hyman Hopes for leg thigh numbness. Past medical history of obstructive sleep apnea, obesity, high triglycerides, postinfectious asthma symptoms, LAP-BAND procedure, ADD, GERD, former smoker quit in 1990. Likely meralgia paresthetica however unusual to have left leg weakness and radiation to the foot with this condition along with decrease left patellar reflex. Will thoroughly work up, if no other etiology found will refer to Dr.  Ethelene Hal who may be able to perform a lateral femoral nerve block. Also with bilateral UE paresthesias, eval for CTS.   Emg/ncs of the left leg and bilateral upper extremities include saphenous sensory and femoral motor conductions MRI of the lumbar spine and pelvis Physical therapy for meralgia paresthetica and left leg weakness If no other etiology found, will refer to Dr. Ethelene Hal for lateral femoral cutaneous nerve blocks.   Cc: Dr. Yolande Jolly, MD  [facility] [address] [place], [place] [address]  Phone [phone] Fax [phone]

## 2016-04-01 NOTE — Patient Instructions (Signed)
Remember to drink plenty of fluid, eat healthy meals and do not skip any meals. Try to eat protein with a every meal and eat a healthy snack such as fruit or nuts in between meals. Try to keep a regular sleep-wake schedule and try to exercise daily, particularly in the form of walking, 20-30 minutes a day, if you can.   As far as your medications are concerned, I would like to suggest: Lidoderm patches  As far as diagnostic testing: lab, emg/ncs, imaging  I would like to see you back for emg/ncs, sooner if we need to. Please call us with any interim questions, concerns, problems, updates or refill requests.   Our phone number is [phone]. We also have an after hours call service for urgent matters and there is a physician on-call for urgent questions. For any emergencies you know to call 911 or go to the nearest emergency room

## 2016-04-02 ENCOUNTER — Telehealth: Payer: Self-pay | Admitting: *Deleted

## 2016-04-02 LAB — BASIC METABOLIC PANEL
BUN/Creatinine Ratio: 30 — ABNORMAL HIGH (ref 9–23)
BUN: 22 mg/dL (ref 6–24)
CO2: 23 mmol/L (ref 18–29)
Calcium: 10 mg/dL (ref 8.7–10.2)
Chloride: 99 mmol/L (ref 96–106)
Creatinine, Ser: 0.74 mg/dL (ref 0.57–1.00)
GFR calc Af Amer: 104 mL/min/{1.73_m2} (ref >59)
GFR calc non Af Amer: 90 mL/min/{1.73_m2} (ref >59)
Glucose: 99 mg/dL (ref 65–99)
Potassium: 5.5 mmol/L — ABNORMAL HIGH (ref 3.5–5.2)
Sodium: 140 mmol/L (ref 134–144)

## 2016-04-02 NOTE — Telephone Encounter (Signed)
-----   Message from Anson FretAntonia B Ahern, MD sent at 04/02/2016  1:16 PM EDT ----- Patient is dehydrated, she should increase fluid intacke otherwise labs ok

## 2016-04-02 NOTE — Telephone Encounter (Signed)
LVM for pt to call about results. Gave [facility] phone number.  

## 2016-04-13 NOTE — Telephone Encounter (Signed)
Per Dr Lucia GaskinsAhern, spoke with patient and informed her that her lab results showed some dehydration; advised she should increase her fluid intake. Other wise her labs looked good. She stated she was out of town that week, was unaware she had been called earlier. She then inquired about her PT referral, stated she had not heard [facility]. She sees them regularly for another problem, has appointment this Thurs. Informed her the referral was placed 04/01/16 and faxed over on 04/02/16. She stated she would call them and ask about the status of that referral. She verbalized understanding, appreciation for call.

## 2016-04-15 DIAGNOSIS — M25561 Pain in right knee: Secondary | ICD-10-CM | POA: Diagnosis not present

## 2016-04-15 DIAGNOSIS — M7121 Synovial cyst of popliteal space [Baker], right knee: Secondary | ICD-10-CM | POA: Diagnosis not present

## 2016-04-22 DIAGNOSIS — F900 Attention-deficit hyperactivity disorder, predominantly inattentive type: Secondary | ICD-10-CM | POA: Diagnosis not present

## 2016-04-23 DIAGNOSIS — R29898 Other symptoms and signs involving the musculoskeletal system: Secondary | ICD-10-CM | POA: Diagnosis not present

## 2016-04-28 DIAGNOSIS — R29898 Other symptoms and signs involving the musculoskeletal system: Secondary | ICD-10-CM | POA: Diagnosis not present

## 2016-04-29 DIAGNOSIS — E785 Hyperlipidemia, unspecified: Secondary | ICD-10-CM | POA: Diagnosis not present

## 2016-04-29 DIAGNOSIS — Z0000 Encounter for general adult medical examination without abnormal findings: Secondary | ICD-10-CM | POA: Diagnosis not present

## 2016-05-04 ENCOUNTER — Ambulatory Visit (INDEPENDENT_AMBULATORY_CARE_PROVIDER_SITE_OTHER): Payer: Self-pay | Admitting: Neurology

## 2016-05-04 ENCOUNTER — Ambulatory Visit (INDEPENDENT_AMBULATORY_CARE_PROVIDER_SITE_OTHER): Payer: BLUE CROSS/BLUE SHIELD | Admitting: Neurology

## 2016-05-04 DIAGNOSIS — G5601 Carpal tunnel syndrome, right upper limb: Secondary | ICD-10-CM

## 2016-05-04 DIAGNOSIS — R29898 Other symptoms and signs involving the musculoskeletal system: Secondary | ICD-10-CM | POA: Diagnosis not present

## 2016-05-04 DIAGNOSIS — R202 Paresthesia of skin: Secondary | ICD-10-CM | POA: Diagnosis not present

## 2016-05-04 DIAGNOSIS — M5417 Radiculopathy, lumbosacral region: Secondary | ICD-10-CM

## 2016-05-04 DIAGNOSIS — G5603 Carpal tunnel syndrome, bilateral upper limbs: Secondary | ICD-10-CM

## 2016-05-04 DIAGNOSIS — G5602 Carpal tunnel syndrome, left upper limb: Secondary | ICD-10-CM

## 2016-05-04 DIAGNOSIS — R200 Anesthesia of skin: Secondary | ICD-10-CM

## 2016-05-04 DIAGNOSIS — Z0289 Encounter for other administrative examinations: Secondary | ICD-10-CM

## 2016-05-04 NOTE — Progress Notes (Signed)
  [facility]    Provider:  Dr Lucia GaskinsAhern Referring Provider: Shirlean MylarWebb, Carol, MD Primary Care Physician:  Frederich ChickWEBB, CAROL D, MD  History:  Dorna LeitzLynn Vassel is a 60 y.o. Lovely female here as a referral from Dr. Hyman HopesWebb for left leg thigh numbness. Past medical history of obstructive sleep apnea, obesity, high triglycerides, postinfectious asthma symptoms, LAP-BAND procedure, ADD, GERD, former smoker quit in 1990. Likely meralgia paresthetica however unusual to have left leg weakness and radiation to the foot with this condition along with decrease left patellar reflex. Will thoroughly work up, if no other etiology found will refer to Dr. Ethelene Halamos who may be able to perform a lateral femoral nerve block as well as MRI Lumbar Spine. Also with bilateral UE paresthesias, eval for CTS.    Summary:   Nerve Conduction Studies were performed on the bilateral upper extremities and left lower extremity.  The right median APB motor nerve showed prolonged distal onset latency (6.3 ms, N<4.0). The right Median 2nd Digit sensory nerve showed prolonged distal peak latency (6.1 ms, N<3.9). F Wave studies indicate that the right Median F wave has delayed latency(37, N<3434ms).  The left median APB motor nerve showed prolonged distal onset latency (5.3 ms, N<4.0). The left Median 2nd Digit sensory nerve showed prolonged distal peak latency (4.8 ms, N<3.9).  F Wave studies indicate that the left Median F wave has normal latency.  Bilateral Ulnar ADM motor nerves were within normal limits. F Wave studies indicate that the bilateral Ulnar F waves have normal latencies The bilateralUlnar 5th digit sensory nerves were within normal limits.  The bilateral peroneal motor conductions were within normal limits with normal f wave latencies  The bilateral tibial motor nerve conductions were within normal limits with normal f wave latencies  The bilateral sural sensory conductions were within normal limits Bilateral  femoral motor nerve conductions were within normal limits Bilateral saphenous sensory nerve conductions were within normal limits Attempted but unable to obtain lateral femoral cutaneous sensory conductions due to large body habitus. Bilateral H reflexes were within normal limits.   EMG needle study of selected right upper and left extremity muscles was performed. The following muscles were normal: Deltoid, Triceps, Pronator Teres, extensor indices, Opponens Pollicis, First Dorsal Interosseous, iliopsoas, vastus medialis, anterior tibialis, medial gastrocnemius, biceps femoris (long head), gluteus medius and gluteus maximus. The left lower lumbar paraspinals showed +1 positive sharp waves.   Conclusion: This is an abnormal study. There is electrophysiologic evidence of right > left moderately-severe Carpal Tunnel Syndrome. Acute ongoing denervation in the left lower lumbar paraspinals could be consistent with lumbar radiculopathy however could not localize as  EMG needle exam of the left lower extremity muscles were normal. No suggestion of polyneuropathy. Recommend MRI of the lumbar spine.   Naomie DeanAntonia Jakaden Ouzts, MD  [facility] [address] [place], [place] [id number]  Phone [phone] Fax [phone]

## 2016-05-05 NOTE — Procedures (Signed)
   Provider:  Dr Lucia GaskinsAhern Referring Provider: Shirlean MylarWebb, Carol, MD Primary Care Physician:  Frederich ChickWEBB, CAROL D, MD  History:  Terri [name] is a 60 y.o. Lovely female here as a referral from Dr. Hyman HopesWebb for left leg thigh numbness. Past medical history of obstructive sleep apnea, obesity, high triglycerides, postinfectious asthma symptoms, LAP-BAND procedure, ADD, GERD, former smoker quit in 1990. Likely meralgia paresthetica however unusual to have left leg weakness and radiation to the foot with this condition along with decrease left patellar reflex. Will thoroughly work up, if no other etiology found will refer to Dr. Ethelene Halamos who may be able to perform a lateral femoral nerve block as well as MRI Lumbar Spine. Also with bilateral UE paresthesias, eval for CTS.    Summary:   Nerve Conduction Studies were performed on the bilateral upper extremities and left lower extremity.  The right median APB motor nerve showed prolonged distal onset latency (6.3 ms, N<4.0). The right Median 2nd Digit sensory nerve showed prolonged distal peak latency (6.1 ms, N<3.9). F Wave studies indicate that the right Median F wave has delayed latency(37, N<5834ms).  The left median APB motor nerve showed prolonged distal onset latency (5.3 ms, N<4.0). The left Median 2nd Digit sensory nerve showed prolonged distal peak latency (4.8 ms, N<3.9).  F Wave studies indicate that the left Median F wave has normal latency.  Bilateral Ulnar ADM motor nerves were within normal limits. F Wave studies indicate that the bilateral Ulnar F waves have normal latencies The bilateralUlnar 5th digit sensory nerves were within normal limits.  The bilateral peroneal motor conductions were within normal limits with normal f wave latencies  The bilateral tibial motor nerve conductions were within normal limits with normal f wave latencies  The bilateral sural sensory conductions were within normal limits Bilateral femoral motor nerve conductions were  within normal limits Bilateral saphenous sensory nerve conductions were within normal limits Attempted but unable to obtain lateral femoral cutaneous sensory conductions due to large body habitus. Bilateral H reflexes were within normal limits.   EMG needle study of selected right upper and left extremity muscles was performed. The following muscles were normal: Deltoid, Triceps, Pronator Teres, extensor indices, Opponens Pollicis, First Dorsal Interosseous, iliopsoas, vastus medialis, anterior tibialis, medial gastrocnemius, biceps femoris (long head), gluteus medius and gluteus maximus. The left lower lumbar paraspinals showed +1 positive sharp waves.   Conclusion: This is an abnormal study. There is electrophysiologic evidence of right > left moderately-severe Carpal Tunnel Syndrome. Acute ongoing denervation in the left lower lumbar paraspinals could be consistent with lumbar radiculopathy however could not localize as  EMG needle exam of the left lower extremity muscles were normal. No suggestion of polyneuropathy. Recommend MRI of the lumbar spine.   Naomie DeanAntonia Colten Desroches, MD  [facility] [address] [place], [place] [id number]  Phone [phone] Fax [phone]

## 2016-05-05 NOTE — Progress Notes (Signed)
Left thigh numbness

## 2016-05-06 DIAGNOSIS — R29898 Other symptoms and signs involving the musculoskeletal system: Secondary | ICD-10-CM | POA: Diagnosis not present

## 2016-05-18 ENCOUNTER — Ambulatory Visit
Admission: RE | Admit: 2016-05-18 | Discharge: 2016-05-18 | Disposition: A | Discharge status: Home / Self Care | Payer: BLUE CROSS/BLUE SHIELD | Source: Ambulatory Visit | Attending: Neurology | Admitting: Neurology

## 2016-05-18 DIAGNOSIS — M4806 Spinal stenosis, lumbar region: Secondary | ICD-10-CM | POA: Diagnosis not present

## 2016-05-18 DIAGNOSIS — R202 Paresthesia of skin: Secondary | ICD-10-CM

## 2016-05-18 DIAGNOSIS — M5417 Radiculopathy, lumbosacral region: Secondary | ICD-10-CM | POA: Diagnosis not present

## 2016-05-18 DIAGNOSIS — R29898 Other symptoms and signs involving the musculoskeletal system: Secondary | ICD-10-CM

## 2016-05-20 ENCOUNTER — Telehealth: Payer: Self-pay | Admitting: *Deleted

## 2016-05-20 ENCOUNTER — Other Ambulatory Visit: Payer: Self-pay | Admitting: Neurology

## 2016-05-20 DIAGNOSIS — M5116 Intervertebral disc disorders with radiculopathy, lumbar region: Secondary | ICD-10-CM

## 2016-05-20 NOTE — Telephone Encounter (Signed)
Dr Lucia GaskinsAhern- pt agreeable to be referred to Dr Ethelene Halamos.  Called and spoke to pt about MRI results per Dr Lucia GaskinsAhern note. Pt verbalized understanding and agreeable to be referred to [facility] Halamos at [facility].   She stated she is going to see Dr Turner Danielsowan [facility] for eval of carpal tunnel.

## 2016-05-20 NOTE — Telephone Encounter (Signed)
-----   Message from Anson FretAntonia B Ahern, MD sent at 05/20/2016  9:38 AM EDT ----- Patient has degenerative/arthritic changes in the low back and it appears she may have left nerve pinching at the L5 nerve root. This corresponds to the emg findings and the pain shooting down her leg.   We can send her to Dr. Ethelene Halamos for evaluation of back injections and if those don't work she may need to see a Careers advisersurgeon. [facility] let me know what she says thanks

## 2016-05-20 NOTE — Telephone Encounter (Signed)
LVM for pt to call about MRI results. Gave [facility] phone number.  

## 2016-05-20 NOTE — Telephone Encounter (Signed)
Pt returned RN's call °

## 2016-05-21 DIAGNOSIS — G5602 Carpal tunnel syndrome, left upper limb: Secondary | ICD-10-CM | POA: Diagnosis not present

## 2016-05-21 DIAGNOSIS — R29898 Other symptoms and signs involving the musculoskeletal system: Secondary | ICD-10-CM | POA: Diagnosis not present

## 2016-05-21 DIAGNOSIS — G5601 Carpal tunnel syndrome, right upper limb: Secondary | ICD-10-CM | POA: Diagnosis not present

## 2016-05-25 DIAGNOSIS — R29898 Other symptoms and signs involving the musculoskeletal system: Secondary | ICD-10-CM | POA: Diagnosis not present

## 2016-05-26 ENCOUNTER — Other Ambulatory Visit: Payer: Self-pay | Admitting: Neurology

## 2016-05-26 DIAGNOSIS — M5417 Radiculopathy, lumbosacral region: Secondary | ICD-10-CM

## 2016-05-26 NOTE — Telephone Encounter (Signed)
Ordered. thanks

## 2016-05-26 NOTE — Telephone Encounter (Signed)
Dr Lucia GaskinsAhern- did you still want to place referral to Dr Ethelene Halamos? I do not see referral

## 2016-05-28 DIAGNOSIS — R29898 Other symptoms and signs involving the musculoskeletal system: Secondary | ICD-10-CM | POA: Diagnosis not present

## 2016-06-03 DIAGNOSIS — L821 Other seborrheic keratosis: Secondary | ICD-10-CM | POA: Diagnosis not present

## 2016-06-03 DIAGNOSIS — L900 Lichen sclerosus et atrophicus: Secondary | ICD-10-CM | POA: Diagnosis not present

## 2016-06-03 DIAGNOSIS — B360 Pityriasis versicolor: Secondary | ICD-10-CM | POA: Diagnosis not present

## 2016-06-05 DIAGNOSIS — R29898 Other symptoms and signs involving the musculoskeletal system: Secondary | ICD-10-CM | POA: Diagnosis not present

## 2016-06-17 DIAGNOSIS — R29898 Other symptoms and signs involving the musculoskeletal system: Secondary | ICD-10-CM | POA: Diagnosis not present

## 2016-06-23 DIAGNOSIS — A630 Anogenital (venereal) warts: Secondary | ICD-10-CM | POA: Diagnosis not present

## 2016-06-25 DIAGNOSIS — F900 Attention-deficit hyperactivity disorder, predominantly inattentive type: Secondary | ICD-10-CM | POA: Diagnosis not present

## 2016-07-31 DIAGNOSIS — M25461 Effusion, right knee: Secondary | ICD-10-CM | POA: Diagnosis not present

## 2016-07-31 DIAGNOSIS — M25561 Pain in right knee: Secondary | ICD-10-CM | POA: Diagnosis not present

## 2016-08-04 DIAGNOSIS — A630 Anogenital (venereal) warts: Secondary | ICD-10-CM | POA: Diagnosis not present

## 2016-08-08 DIAGNOSIS — M25461 Effusion, right knee: Secondary | ICD-10-CM | POA: Diagnosis not present

## 2016-08-13 DIAGNOSIS — M1711 Unilateral primary osteoarthritis, right knee: Secondary | ICD-10-CM | POA: Diagnosis not present

## 2016-08-13 DIAGNOSIS — F900 Attention-deficit hyperactivity disorder, predominantly inattentive type: Secondary | ICD-10-CM | POA: Diagnosis not present

## 2016-08-13 DIAGNOSIS — M23321 Other meniscus derangements, posterior horn of medial meniscus, right knee: Secondary | ICD-10-CM | POA: Diagnosis not present

## 2016-08-26 DIAGNOSIS — M170 Bilateral primary osteoarthritis of knee: Secondary | ICD-10-CM | POA: Diagnosis not present

## 2016-08-26 DIAGNOSIS — M23321 Other meniscus derangements, posterior horn of medial meniscus, right knee: Secondary | ICD-10-CM | POA: Diagnosis not present

## 2016-10-07 DIAGNOSIS — M1711 Unilateral primary osteoarthritis, right knee: Secondary | ICD-10-CM | POA: Diagnosis not present

## 2016-10-07 DIAGNOSIS — M1712 Unilateral primary osteoarthritis, left knee: Secondary | ICD-10-CM | POA: Diagnosis not present

## 2016-10-23 DIAGNOSIS — Z23 Encounter for immunization: Secondary | ICD-10-CM | POA: Diagnosis not present

## 2016-11-12 DIAGNOSIS — F900 Attention-deficit hyperactivity disorder, predominantly inattentive type: Secondary | ICD-10-CM | POA: Diagnosis not present

## 2016-11-18 DIAGNOSIS — M1711 Unilateral primary osteoarthritis, right knee: Secondary | ICD-10-CM | POA: Diagnosis not present

## 2016-11-18 DIAGNOSIS — M1712 Unilateral primary osteoarthritis, left knee: Secondary | ICD-10-CM | POA: Diagnosis not present

## 2016-11-26 DIAGNOSIS — H40013 Open angle with borderline findings, low risk, bilateral: Secondary | ICD-10-CM | POA: Diagnosis not present

## 2016-11-26 DIAGNOSIS — H26491 Other secondary cataract, right eye: Secondary | ICD-10-CM | POA: Diagnosis not present

## 2016-11-26 DIAGNOSIS — H25812 Combined forms of age-related cataract, left eye: Secondary | ICD-10-CM | POA: Diagnosis not present

## 2016-12-11 DIAGNOSIS — S61419A Laceration without foreign body of unspecified hand, initial encounter: Secondary | ICD-10-CM | POA: Diagnosis not present

## 2016-12-18 DIAGNOSIS — Z4802 Encounter for removal of sutures: Secondary | ICD-10-CM | POA: Diagnosis not present

## 2017-02-23 DIAGNOSIS — F331 Major depressive disorder, recurrent, moderate: Secondary | ICD-10-CM | POA: Diagnosis not present

## 2017-02-23 DIAGNOSIS — F411 Generalized anxiety disorder: Secondary | ICD-10-CM | POA: Diagnosis not present

## 2017-02-23 DIAGNOSIS — F900 Attention-deficit hyperactivity disorder, predominantly inattentive type: Secondary | ICD-10-CM | POA: Diagnosis not present

## 2017-03-01 DIAGNOSIS — J301 Allergic rhinitis due to pollen: Secondary | ICD-10-CM | POA: Diagnosis not present

## 2017-03-01 DIAGNOSIS — K219 Gastro-esophageal reflux disease without esophagitis: Secondary | ICD-10-CM | POA: Diagnosis not present

## 2017-03-01 DIAGNOSIS — J4530 Mild persistent asthma, uncomplicated: Secondary | ICD-10-CM | POA: Diagnosis not present

## 2017-03-30 DIAGNOSIS — H25812 Combined forms of age-related cataract, left eye: Secondary | ICD-10-CM | POA: Diagnosis not present

## 2017-03-30 DIAGNOSIS — H04123 Dry eye syndrome of bilateral lacrimal glands: Secondary | ICD-10-CM | POA: Diagnosis not present

## 2017-03-30 DIAGNOSIS — H26491 Other secondary cataract, right eye: Secondary | ICD-10-CM | POA: Diagnosis not present

## 2017-03-30 DIAGNOSIS — H40013 Open angle with borderline findings, low risk, bilateral: Secondary | ICD-10-CM | POA: Diagnosis not present

## 2017-05-03 ENCOUNTER — Other Ambulatory Visit (HOSPITAL_COMMUNITY)
Admission: RE | Admit: 2017-05-03 | Discharge: 2017-05-03 | Disposition: A | Discharge status: Home / Self Care | Payer: BLUE CROSS/BLUE SHIELD | Source: Ambulatory Visit | Attending: Family Medicine | Admitting: Family Medicine

## 2017-05-03 ENCOUNTER — Other Ambulatory Visit: Payer: Self-pay | Admitting: Family Medicine

## 2017-05-03 DIAGNOSIS — Z5181 Encounter for therapeutic drug level monitoring: Secondary | ICD-10-CM | POA: Diagnosis not present

## 2017-05-03 DIAGNOSIS — E785 Hyperlipidemia, unspecified: Secondary | ICD-10-CM | POA: Diagnosis not present

## 2017-05-03 DIAGNOSIS — Z0000 Encounter for general adult medical examination without abnormal findings: Secondary | ICD-10-CM | POA: Diagnosis not present

## 2017-05-03 DIAGNOSIS — Z124 Encounter for screening for malignant neoplasm of cervix: Secondary | ICD-10-CM | POA: Diagnosis not present

## 2017-05-03 DIAGNOSIS — Z01411 Encounter for gynecological examination (general) (routine) with abnormal findings: Secondary | ICD-10-CM | POA: Diagnosis not present

## 2017-05-04 LAB — CYTOLOGY - PAP
Diagnosis: NEGATIVE
HPV: NOT DETECTED

## 2017-05-24 DIAGNOSIS — F331 Major depressive disorder, recurrent, moderate: Secondary | ICD-10-CM | POA: Diagnosis not present

## 2017-05-24 DIAGNOSIS — F900 Attention-deficit hyperactivity disorder, predominantly inattentive type: Secondary | ICD-10-CM | POA: Diagnosis not present

## 2017-05-24 DIAGNOSIS — F411 Generalized anxiety disorder: Secondary | ICD-10-CM | POA: Diagnosis not present

## 2017-07-21 DIAGNOSIS — Z419 Encounter for procedure for purposes other than remedying health state, unspecified: Secondary | ICD-10-CM | POA: Diagnosis not present

## 2017-08-17 DIAGNOSIS — D485 Neoplasm of uncertain behavior of skin: Secondary | ICD-10-CM | POA: Diagnosis not present

## 2017-08-17 DIAGNOSIS — M67449 Ganglion, unspecified hand: Secondary | ICD-10-CM | POA: Diagnosis not present

## 2017-08-25 DIAGNOSIS — F900 Attention-deficit hyperactivity disorder, predominantly inattentive type: Secondary | ICD-10-CM | POA: Diagnosis not present

## 2017-10-29 ENCOUNTER — Encounter: Payer: Self-pay | Admitting: Gastroenterology

## 2017-11-24 DIAGNOSIS — F331 Major depressive disorder, recurrent, moderate: Secondary | ICD-10-CM | POA: Diagnosis not present

## 2017-11-24 DIAGNOSIS — F900 Attention-deficit hyperactivity disorder, predominantly inattentive type: Secondary | ICD-10-CM | POA: Diagnosis not present

## 2017-11-24 DIAGNOSIS — F411 Generalized anxiety disorder: Secondary | ICD-10-CM | POA: Diagnosis not present

## 2018-02-23 DIAGNOSIS — F411 Generalized anxiety disorder: Secondary | ICD-10-CM | POA: Diagnosis not present

## 2018-02-23 DIAGNOSIS — F331 Major depressive disorder, recurrent, moderate: Secondary | ICD-10-CM | POA: Diagnosis not present

## 2018-02-23 DIAGNOSIS — F901 Attention-deficit hyperactivity disorder, predominantly hyperactive type: Secondary | ICD-10-CM | POA: Diagnosis not present

## 2018-03-02 DIAGNOSIS — J301 Allergic rhinitis due to pollen: Secondary | ICD-10-CM | POA: Diagnosis not present

## 2018-03-02 DIAGNOSIS — K219 Gastro-esophageal reflux disease without esophagitis: Secondary | ICD-10-CM | POA: Diagnosis not present

## 2018-03-02 DIAGNOSIS — J4530 Mild persistent asthma, uncomplicated: Secondary | ICD-10-CM | POA: Diagnosis not present

## 2018-05-25 DIAGNOSIS — F901 Attention-deficit hyperactivity disorder, predominantly hyperactive type: Secondary | ICD-10-CM | POA: Diagnosis not present

## 2018-05-25 DIAGNOSIS — F331 Major depressive disorder, recurrent, moderate: Secondary | ICD-10-CM | POA: Diagnosis not present

## 2018-05-25 DIAGNOSIS — F411 Generalized anxiety disorder: Secondary | ICD-10-CM | POA: Diagnosis not present

## 2018-07-27 ENCOUNTER — Other Ambulatory Visit: Payer: Self-pay | Admitting: Family Medicine

## 2018-07-27 ENCOUNTER — Ambulatory Visit
Admission: RE | Admit: 2018-07-27 | Discharge: 2018-07-27 | Disposition: A | Discharge status: Home / Self Care | Payer: BLUE CROSS/BLUE SHIELD | Source: Ambulatory Visit | Attending: Family Medicine | Admitting: Family Medicine

## 2018-07-27 DIAGNOSIS — Z1231 Encounter for screening mammogram for malignant neoplasm of breast: Secondary | ICD-10-CM

## 2018-10-10 DIAGNOSIS — G4733 Obstructive sleep apnea (adult) (pediatric): Secondary | ICD-10-CM | POA: Diagnosis not present

## 2018-10-21 DIAGNOSIS — R05 Cough: Secondary | ICD-10-CM | POA: Diagnosis not present

## 2018-10-25 DIAGNOSIS — J40 Bronchitis, not specified as acute or chronic: Secondary | ICD-10-CM | POA: Diagnosis not present

## 2018-10-30 DIAGNOSIS — R05 Cough: Secondary | ICD-10-CM | POA: Diagnosis not present

## 2018-10-30 DIAGNOSIS — J209 Acute bronchitis, unspecified: Secondary | ICD-10-CM | POA: Diagnosis not present

## 2018-11-03 DIAGNOSIS — F331 Major depressive disorder, recurrent, moderate: Secondary | ICD-10-CM | POA: Diagnosis not present

## 2018-12-13 DIAGNOSIS — K219 Gastro-esophageal reflux disease without esophagitis: Secondary | ICD-10-CM | POA: Diagnosis not present

## 2018-12-13 DIAGNOSIS — J301 Allergic rhinitis due to pollen: Secondary | ICD-10-CM | POA: Diagnosis not present

## 2018-12-13 DIAGNOSIS — G4733 Obstructive sleep apnea (adult) (pediatric): Secondary | ICD-10-CM | POA: Diagnosis not present

## 2018-12-13 DIAGNOSIS — J4530 Mild persistent asthma, uncomplicated: Secondary | ICD-10-CM | POA: Diagnosis not present

## 2019-01-31 DIAGNOSIS — F901 Attention-deficit hyperactivity disorder, predominantly hyperactive type: Secondary | ICD-10-CM | POA: Diagnosis not present

## 2019-01-31 DIAGNOSIS — F411 Generalized anxiety disorder: Secondary | ICD-10-CM | POA: Diagnosis not present

## 2019-01-31 DIAGNOSIS — F331 Major depressive disorder, recurrent, moderate: Secondary | ICD-10-CM | POA: Diagnosis not present

## 2019-03-28 DIAGNOSIS — F331 Major depressive disorder, recurrent, moderate: Secondary | ICD-10-CM | POA: Diagnosis not present

## 2019-03-28 DIAGNOSIS — F411 Generalized anxiety disorder: Secondary | ICD-10-CM | POA: Diagnosis not present

## 2019-03-28 DIAGNOSIS — F901 Attention-deficit hyperactivity disorder, predominantly hyperactive type: Secondary | ICD-10-CM | POA: Diagnosis not present

## 2019-04-03 DIAGNOSIS — L720 Epidermal cyst: Secondary | ICD-10-CM | POA: Diagnosis not present

## 2019-04-03 DIAGNOSIS — D485 Neoplasm of uncertain behavior of skin: Secondary | ICD-10-CM | POA: Diagnosis not present

## 2019-06-28 DIAGNOSIS — F331 Major depressive disorder, recurrent, moderate: Secondary | ICD-10-CM | POA: Diagnosis not present

## 2019-06-28 DIAGNOSIS — F900 Attention-deficit hyperactivity disorder, predominantly inattentive type: Secondary | ICD-10-CM | POA: Diagnosis not present

## 2019-06-28 DIAGNOSIS — F411 Generalized anxiety disorder: Secondary | ICD-10-CM | POA: Diagnosis not present

## 2019-06-29 ENCOUNTER — Encounter: Payer: Self-pay | Admitting: Gastroenterology

## 2019-08-09 ENCOUNTER — Ambulatory Visit (INDEPENDENT_AMBULATORY_CARE_PROVIDER_SITE_OTHER): Payer: BC Managed Care – PPO | Admitting: Gastroenterology

## 2019-08-09 ENCOUNTER — Encounter: Payer: Self-pay | Admitting: Gastroenterology

## 2019-08-09 ENCOUNTER — Other Ambulatory Visit: Payer: Self-pay

## 2019-08-09 VITALS — BP 142/80 | HR 106 | Temp 97.8°F | Ht 66.0 in | Wt 251.0 lb

## 2019-08-09 DIAGNOSIS — R0989 Other specified symptoms and signs involving the circulatory and respiratory systems: Secondary | ICD-10-CM | POA: Diagnosis not present

## 2019-08-09 DIAGNOSIS — R1310 Dysphagia, unspecified: Secondary | ICD-10-CM | POA: Diagnosis not present

## 2019-08-09 DIAGNOSIS — J301 Allergic rhinitis due to pollen: Secondary | ICD-10-CM | POA: Diagnosis not present

## 2019-08-09 DIAGNOSIS — Z9884 Bariatric surgery status: Secondary | ICD-10-CM

## 2019-08-09 DIAGNOSIS — K219 Gastro-esophageal reflux disease without esophagitis: Secondary | ICD-10-CM | POA: Diagnosis not present

## 2019-08-09 DIAGNOSIS — Z1159 Encounter for screening for other viral diseases: Secondary | ICD-10-CM | POA: Diagnosis not present

## 2019-08-09 DIAGNOSIS — R198 Other specified symptoms and signs involving the digestive system and abdomen: Secondary | ICD-10-CM

## 2019-08-09 DIAGNOSIS — J4530 Mild persistent asthma, uncomplicated: Secondary | ICD-10-CM | POA: Diagnosis not present

## 2019-08-09 MED ORDER — OMEPRAZOLE 40 MG PO CPDR: 40.0000 mg | DELAYED_RELEASE_CAPSULE | Freq: Two times a day (BID) | ORAL | 3 refills | Status: DC

## 2019-08-09 NOTE — Progress Notes (Signed)
Terri [name]    [id number]    28-Mar-1956  Primary Care Physician:[name], [name], MD  Referring Physician: [name], MD [address],  [place] [address]   Chief complaint: Dysphagia, regurgitation  HPI:  63 year old female with history of gastric lap band 2005, GERD, erosive esophagitis previously followed by Dr. [name] is here to establish care.  She was last seen in 2014.  [name] did the initial lap band placement in 2006.  She has not had any adjustment in the past many years. She initially lost 80 lbs and gained back 40 lbs She has been having difficulty swallowing with regurgitation for past 10 to 12 years She is constantly choking with both liquids and solids  Denies any nausea, vomiting, abdominal pain, melena or bright red blood per rectum  No family history of GI malignancy.  CT abdomen pelvis with contrast July 04, 2015: Postop changes of gastric lap band placement, sigmoid and descending diverticulosis otherwise no acute abdominal pathology.  EGD October 14, 2012: By Dr. [name] showed reflux esophagitis LA grade C with linear erosions, 4 cm hiatal hernia and lap band  Colonoscopy October 14, 2012: Sigmoid diverticulosis otherwise normal exam.  Upper GI series March 24, 2012: Poor esophageal motility with retained contrast in the dilated esophagus, gastric band appears in appropriate position.  Outpatient Encounter Medications as of 08/09/2019  Medication Sig  . budesonide-formoterol (SYMBICORT) 160-4.5 MCG/ACT inhaler Inhale 2 puffs into the lungs 2 (two) times daily as needed. For wheezing  . escitalopram (LEXAPRO) 20 MG tablet Take 20 mg by mouth daily.  Marland Kitchen levalbuterol (XOPENEX HFA) 45 MCG/ACT inhaler INHALE 1-2 PUFFS EVERY 6 HOURS AS NEEDED COUGH OR WHEEZE  . levocetirizine (XYZAL) 5 MG tablet Take 2.5 mg by mouth every evening.  . lisdexamfetamine (VYVANSE) 60 MG capsule Take 60 mg by mouth every morning.   . montelukast  (SINGULAIR) 10 MG tablet Take 10 mg by mouth every evening.   . zolpidem (AMBIEN) 10 MG tablet Take 10 mg by mouth at bedtime as needed. For sleep  . omeprazole (PRILOSEC) 40 MG capsule Take 1 capsule (40 mg total) by mouth 2 (two) times daily.  . [DISCONTINUED] clindamycin (CLEOCIN) 150 MG capsule Take 150 mg by mouth 4 (four) times daily. For possible infection in her tooth  . [DISCONTINUED] lidocaine (LIDODERM) 5 % Place 3 patches onto the skin daily. Remove & Discard patch within 12 hours or as directed by MD  . [DISCONTINUED] sertraline (ZOLOFT) 50 MG tablet Take 50 mg by mouth daily.   No facility-administered encounter medications on file as of 08/09/2019.     Allergies as of 08/09/2019  . (No Known Allergies)    Past Medical History:  Diagnosis Date  . ADD (attention deficit disorder)   . Allergy    seasonal  . Anemia   . Anxiety   . Asthma   . Complication of anesthesia    can be difficult to wake up/need prompts to breathe  . GERD (gastroesophageal reflux disease)    Barrett's  . Obesity   . Sleep apnea    Uses c pap, but sleep apnea resolved after surgery    Past Surgical History:  Procedure Laterality Date  . ABDOMINAL DEBRIDEMENT  05/26/2012  . ABDOMINOPLASTY  01/2009  . ADENOIDECTOMY    . COLONOSCOPY    . KNEE ARTHROSCOPY     left  . LAPAROSCOPIC GASTRIC BANDING  05/2004  . POLYPECTOMY     post polyp bleed  . seroma    . TONSILLECTOMY    . UPPER GASTROINTESTINAL ENDOSCOPY     Barrett's  . UVULOPALATOPHARYNGOPLASTY      Family History  Problem Relation Age of Onset  . Diabetes Mother   . Depression Mother   . Heart attack Mother   . Heart Problems Father   . Colon cancer Neg Hx   . Stomach cancer Neg Hx   . Stroke Neg Hx   . Neuropathy Neg Hx     Social History   Socioeconomic History  . Marital status: Married    Spouse name: Leonette Most "Chip"  . Number of children: 0  . Years of education: 68  . Highest education level: Not on file   Occupational History  . Occupation: Self-employed  Social Needs  . Financial resource strain: Not on file  . Food insecurity    Worry: Not on file    Inability: Not on file  . Transportation needs    Medical: Not on file    Non-medical: Not on file  Tobacco Use  . Smoking status: Former Smoker    Quit date: 11/30/1993    Years since quitting: 25.7  . Smokeless tobacco: Never Used  Substance and Sexual Activity  . Alcohol use: Yes    Alcohol/week: 0.0 standard drinks    Comment: 1-2/week  . Drug use: No  . Sexual activity: Not on file  Lifestyle  . Physical activity    Days per week: Not on file    Minutes per session: Not on file  . Stress: Not on file  Relationships  . Social Musician on phone: Not on file    Gets together: Not on file    Attends religious service: Not on file    Active member of club or organization: Not on file    Attends meetings of clubs or organizations: Not on file    Relationship status: Not on file  . Intimate partner violence    Fear of current or ex partner: Not on file    Emotionally abused: Not on file    Physically abused: Not on file    Forced sexual activity: Not on file  Other Topics Concern  . Not on file  Social History Narrative   Lives with husband, Chip   Caffeine use: Daily coffee      Review of systems: Review of Systems  Constitutional: Negative for fever and chills.  HENT: Negative.   Eyes: Negative for blurred vision.  Respiratory: Negative for cough, shortness of breath and wheezing.   Cardiovascular: Negative for chest pain and palpitations.  Gastrointestinal: as per HPI Genitourinary: Negative for dysuria, urgency, frequency and hematuria.  Musculoskeletal: Negative for myalgias, back pain and joint pain.  Skin: Negative for itching and rash.  Neurological: Negative for dizziness, tremors, focal weakness, seizures and loss of consciousness.  Endo/Heme/Allergies: Positive for seasonal allergies.   Psychiatric/Behavioral: Negative for depression, suicidal ideas and hallucinations.  All other systems reviewed and are negative.   Physical Exam: Vitals:   08/09/19 1356  BP: (!) 142/80  Pulse: (!) 106  Temp: 97.8 F (36.6 C)   Body mass index is 40.51 kg/m. Gen:      No acute distress HEENT:  EOMI, sclera anicteric Neck:     No masses; no thyromegaly Lungs:    Clear to auscultation bilaterally; normal respiratory effort CV:  Regular rate and rhythm; no murmurs Abd:      + bowel sounds; soft, non-tender; no palpable masses, no distension Ext:    No edema; adequate peripheral perfusion Skin:      Warm and dry; no rash Neuro: alert and oriented x 3 Psych: normal mood and affect  Data Reviewed:  Reviewed labs, radiology imaging, old records and pertinent past GI work up   Assessment and Plan/Recommendations:  [age] year old very pleasant female with morbid obesity, history of lap gastric band 2005 with worsening dysphagia and regurgitation  Reviewed upper GI series from 2013, evidence of possible gastroesophageal junction outflow obstruction secondary to gastric lap band Will obtain repeat upper GI series to compare  Schedule EGD to exclude any mucosal abnormality, stricture or neoplastic lesion The risks and benefits as well as alternatives of endoscopic procedure(s) have been discussed and reviewed. All questions answered. The patient agrees to proceed.  I will request Dr. Ezzard StandingNewman to deflate the gastric lap band if findings are suggestive of GJ outflow obstruction  Continue antireflux measures    K. Scherry RanVeena Nandigam , MD    CC: Shirlean MylarWebb, Carol, MD

## 2019-08-09 NOTE — Patient Instructions (Addendum)
You have been scheduled for an Upper GI Series at [facility] . Your appointment is on 08/15/2019 at 10:30am. Please arrive 15 minutes prior to your test for registration. Make sure not to eat or drink anything after midnight on the night before your test. If you need to reschedule, please call radiology at [phone]. ________________________________________________________________ An upper GI series uses x rays to help diagnose problems of the upper GI tract, which includes the esophagus, stomach, and duodenum. The duodenum is the first part of the small intestine. An upper GI series is conducted by a radiology technologist or a radiologist-a doctor who specializes in x-ray imaging-at a hospital or outpatient center. While sitting or standing in front of an x-ray machine, the patient drinks barium liquid, which is often white and has a chalky consistency and taste. The barium liquid coats the lining of the upper GI tract and makes signs of disease show up more clearly on x rays. X-ray video, called fluoroscopy, is used to view the barium liquid moving through the esophagus, stomach, and duodenum. Additional x rays and fluoroscopy are performed while the patient lies on an x-ray table. To fully coat the upper GI tract with barium liquid, the technologist or radiologist may press on the abdomen or ask the patient to change position. Patients hold still in various positions, allowing the technologist or radiologist to take x rays of the upper GI tract at different angles. If a technologist conducts the upper GI series, a radiologist will later examine the images to look for problems.  This test typically takes about 1 hour to complete. __________________________________________________________________   Terri [name] have been scheduled for an endoscopy. Please follow written instructions given to you at your visit today. If you use inhalers (even only as needed), please bring them with you on the day  of your procedure.   We have sent in omeprazole to your pharmacy  Follow up in 2 months   Gastroesophageal Reflux Disease, Adult Gastroesophageal reflux (GER) happens when acid from the stomach flows up into the tube that connects the mouth and the stomach (esophagus). Normally, food travels down the esophagus and stays in the stomach to be digested. However, when a person has GER, food and stomach acid sometimes move back up into the esophagus. If this becomes a more serious problem, the person may be diagnosed with a disease called gastroesophageal reflux disease (GERD). GERD occurs when the reflux:  Happens often.  Causes frequent or severe symptoms.  Causes problems such as damage to the esophagus. When stomach acid comes in contact with the esophagus, the acid may cause soreness (inflammation) in the esophagus. Over time, GERD may create small holes (ulcers) in the lining of the esophagus. What are the causes? This condition is caused by a problem with the muscle between the esophagus and the stomach (lower esophageal sphincter, or LES). Normally, the LES muscle closes after food passes through the esophagus to the stomach. When the LES is weakened or abnormal, it does not close properly, and that allows food and stomach acid to go back up into the esophagus. The LES can be weakened by certain dietary substances, medicines, and medical conditions, including:  Tobacco use.  Pregnancy.  Having a hiatal hernia.  Alcohol use.  Certain foods and beverages, such as coffee, chocolate, onions, and peppermint. What increases the risk? You are more likely to develop this condition if you:  Have an increased body weight.  Have a connective tissue disorder.  Use NSAID medicines.  What are the signs or symptoms? Symptoms of this condition include:  Heartburn.  Difficult or painful swallowing.  The feeling of having a lump in the throat.  Abitter taste in the mouth.  Bad  breath.  Having a large amount of saliva.  Having an upset or bloated stomach.  Belching.  Chest pain. Different conditions can cause chest pain. Make sure you see your health care provider if you experience chest pain.  Shortness of breath or wheezing.  Ongoing (chronic) cough or a night-time cough.  Wearing away of tooth enamel.  Weight loss. How is this diagnosed? Your health care provider will take a medical history and perform a physical exam. To determine if you have mild or severe GERD, your health care provider may also monitor how you respond to treatment. You may also have tests, including:  A test to examine your stomach and esophagus with a small camera (endoscopy).  A test thatmeasures the acidity level in your esophagus.  A test thatmeasures how much pressure is on your esophagus.  A barium swallow or modified barium swallow test to show the shape, size, and functioning of your esophagus. How is this treated? The goal of treatment is to help relieve your symptoms and to prevent complications. Treatment for this condition may vary depending on how severe your symptoms are. Your health care provider may recommend:  Changes to your diet.  Medicine.  Surgery. Follow these instructions at home: Eating and drinking   Follow a diet as recommended by your health care provider. This may involve avoiding foods and drinks such as: ? Coffee and tea (with or without caffeine). ? Drinks that containalcohol. ? Energy drinks and sports drinks. ? Carbonated drinks or sodas. ? Chocolate and cocoa. ? Peppermint and mint flavorings. ? Garlic and onions. ? Horseradish. ? Spicy and acidic foods, including peppers, chili powder, curry powder, vinegar, hot sauces, and barbecue sauce. ? Citrus fruit juices and citrus fruits, such as oranges, lemons, and limes. ? Tomato-based foods, such as red sauce, chili, salsa, and pizza with red sauce. ? Fried and fatty foods, such as  donuts, french fries, potato chips, and high-fat dressings. ? High-fat meats, such as hot dogs and fatty cuts of red and white meats, such as rib eye steak, sausage, ham, and bacon. ? High-fat dairy items, such as whole milk, butter, and cream cheese.  Eat small, frequent meals instead of large meals.  Avoid drinking large amounts of liquid with your meals.  Avoid eating meals during the 2-3 hours before bedtime.  Avoid lying down right after you eat.  Do not exercise right after you eat. Lifestyle   Do not use any products that contain nicotine or tobacco, such as cigarettes, e-cigarettes, and chewing tobacco. If you need help quitting, ask your health care provider.  Try to reduce your stress by using methods such as yoga or meditation. If you need help reducing stress, ask your health care provider.  If you are overweight, reduce your weight to an amount that is healthy for you. Ask your health care provider for guidance about a safe weight loss goal. General instructions  Pay attention to any changes in your symptoms.  Take over-the-counter and prescription medicines only as told by your health care provider. Do not take aspirin, ibuprofen, or other NSAIDs unless your health care provider told you to do so.  Wear loose-fitting clothing. Do not wear anything tight around your waist that causes pressure on your abdomen.  Raise (elevate)  the head of your bed about 6 inches (15 cm).  Avoid bending over if this makes your symptoms worse.  Keep all follow-up visits as told by your health care provider. This is important. Contact a health care provider if:  You have: ? New symptoms. ? Unexplained weight loss. ? Difficulty swallowing or it hurts to swallow. ? Wheezing or a persistent cough. ? A hoarse voice.  Your symptoms do not improve with treatment. Get help right away if you:  Have pain in your arms, neck, jaw, teeth, or back.  Feel sweaty, dizzy, or  light-headed.  Have chest pain or shortness of breath.  Vomit and your vomit looks like blood or coffee grounds.  Faint.  Have stool that is bloody or black.  Cannot swallow, drink, or eat. Summary  Gastroesophageal reflux happens when acid from the stomach flows up into the esophagus. GERD is a disease in which the reflux happens often, causes frequent or severe symptoms, or causes problems such as damage to the esophagus.  Treatment for this condition may vary depending on how severe your symptoms are. Your health care provider may recommend diet and lifestyle changes, medicine, or surgery.  Contact a health care provider if you have new or worsening symptoms.  Take over-the-counter and prescription medicines only as told by your health care provider. Do not take aspirin, ibuprofen, or other NSAIDs unless your health care provider told you to do so.  Keep all follow-up visits as told by your health care provider. This is important. This information is not intended to replace advice given to you by your health care provider. Make sure you discuss any questions you have with your health care provider. Document Released: 06/24/2005 Document Revised: 03/23/2018 Document Reviewed: 03/23/2018 Elsevier Patient Education  2020 [employer]

## 2019-08-15 ENCOUNTER — Other Ambulatory Visit: Payer: Self-pay

## 2019-08-15 ENCOUNTER — Other Ambulatory Visit: Payer: Self-pay | Admitting: Gastroenterology

## 2019-08-15 ENCOUNTER — Ambulatory Visit (HOSPITAL_COMMUNITY)
Admission: RE | Admit: 2019-08-15 | Discharge: 2019-08-15 | Disposition: A | Discharge status: Home / Self Care | Payer: BC Managed Care – PPO | Source: Ambulatory Visit | Attending: Gastroenterology | Admitting: Gastroenterology

## 2019-08-15 DIAGNOSIS — R1310 Dysphagia, unspecified: Secondary | ICD-10-CM | POA: Diagnosis not present

## 2019-08-15 DIAGNOSIS — R198 Other specified symptoms and signs involving the digestive system and abdomen: Secondary | ICD-10-CM

## 2019-08-15 DIAGNOSIS — K219 Gastro-esophageal reflux disease without esophagitis: Secondary | ICD-10-CM | POA: Insufficient documentation

## 2019-08-15 DIAGNOSIS — R0989 Other specified symptoms and signs involving the circulatory and respiratory systems: Secondary | ICD-10-CM | POA: Insufficient documentation

## 2019-08-15 DIAGNOSIS — K224 Dyskinesia of esophagus: Secondary | ICD-10-CM | POA: Diagnosis not present

## 2019-08-17 ENCOUNTER — Encounter: Payer: Self-pay | Admitting: Gastroenterology

## 2019-08-25 ENCOUNTER — Other Ambulatory Visit (HOSPITAL_COMMUNITY)
Admission: RE | Admit: 2019-08-25 | Discharge: 2019-08-25 | Disposition: A | Discharge status: Home / Self Care | Payer: BC Managed Care – PPO | Source: Ambulatory Visit | Attending: Gastroenterology | Admitting: Gastroenterology

## 2019-08-25 DIAGNOSIS — Z20828 Contact with and (suspected) exposure to other viral communicable diseases: Secondary | ICD-10-CM | POA: Insufficient documentation

## 2019-08-25 DIAGNOSIS — Z01812 Encounter for preprocedural laboratory examination: Secondary | ICD-10-CM | POA: Insufficient documentation

## 2019-08-25 LAB — SARS CORONAVIRUS 2 (TAT 6-24 HRS): SARS Coronavirus 2: NEGATIVE

## 2019-08-29 ENCOUNTER — Other Ambulatory Visit: Payer: Self-pay

## 2019-08-29 ENCOUNTER — Ambulatory Visit (AMBULATORY_SURGERY_CENTER): Payer: BC Managed Care – PPO | Admitting: Gastroenterology

## 2019-08-29 ENCOUNTER — Encounter: Payer: Self-pay | Admitting: Gastroenterology

## 2019-08-29 VITALS — BP 143/68 | HR 76 | Temp 99.4°F | Resp 17 | Ht 66.0 in | Wt 251.0 lb

## 2019-08-29 DIAGNOSIS — R1310 Dysphagia, unspecified: Secondary | ICD-10-CM

## 2019-08-29 DIAGNOSIS — K2270 Barrett's esophagus without dysplasia: Secondary | ICD-10-CM | POA: Diagnosis not present

## 2019-08-29 MED ORDER — SODIUM CHLORIDE 0.9 % IV SOLN: 500.0000 mL | Freq: Once | INTRAVENOUS | Status: DC

## 2019-08-29 NOTE — Progress Notes (Signed)
Called to room to assist during endoscopic procedure.  Patient ID and intended procedure confirmed with present staff. Received instructions for my participation in the procedure from the performing physician.  

## 2019-08-29 NOTE — Patient Instructions (Signed)
YOU HAD AN ENDOSCOPIC PROCEDURE TODAY AT THE  ENDOSCOPY CENTER:   Refer to the procedure report that was given to you for any specific questions about what was found during the examination.  If the procedure report does not answer your questions, please call your gastroenterologist to clarify.  If you requested that your care partner not be given the details of your procedure findings, then the procedure report has been included in a sealed envelope for you to review at your convenience later.  YOU SHOULD EXPECT: Some feelings of bloating in the abdomen. Passage of more gas than usual.  Walking can help get rid of the air that was put into your GI tract during the procedure and reduce the bloating. If you had a lower endoscopy (such as a colonoscopy or flexible sigmoidoscopy) you may notice spotting of blood in your stool or on the toilet paper. If you underwent a bowel prep for your procedure, you may not have a normal bowel movement for a few days.  Please Note:  You might notice some irritation and congestion in your nose or some drainage.  This is from the oxygen used during your procedure.  There is no need for concern and it should clear up in a day or so.  SYMPTOMS TO REPORT IMMEDIATELY:   Following upper endoscopy (EGD)  Vomiting of blood or coffee ground material  New chest pain or pain under the shoulder blades  Painful or persistently difficult swallowing  New shortness of breath  Fever of 100F or higher  Black, tarry-looking stools  For urgent or emergent issues, a gastroenterologist can be reached at any hour by calling [phone].   DIET:  We do recommend a small meal at first, but then you may proceed to your regular diet.  Drink plenty of fluids but you should avoid alcoholic beverages for 24 hours.  ACTIVITY:  You should plan to take it easy for the rest of today and you should NOT DRIVE or use heavy machinery until tomorrow (because of the sedation medicines used  during the test).    FOLLOW UP: Our staff will call the number listed on your records 48-72 hours following your procedure to check on you and address any questions or concerns that you may have regarding the information given to you following your procedure. If we do not reach you, we will leave a message.  We will attempt to reach you two times.  During this call, we will ask if you have developed any symptoms of COVID 19. If you develop any symptoms (ie: fever, flu-like symptoms, shortness of breath, cough etc.) before then, please call [phone].  If you test positive for Covid 19 in the 2 weeks post procedure, please call and report this information to [place].    If any biopsies were taken you will be contacted by phone or by letter within the next 1-3 weeks.  Please call us at [phone] if you have not heard about the biopsies in 3 weeks.    SIGNATURES/CONFIDENTIALITY: You and/or your care partner have signed paperwork which will be entered into your electronic medical record.  These signatures attest to the fact that that the information above on your After Visit Summary has been reviewed and is understood.  Full responsibility of the confidentiality of this discharge information lies with you and/or your care-partner.     You may resume your current medications today. Await biopsy results. The office will call you with an appointment date  and time for 2 months. Please call if any questions or concerns.

## 2019-08-29 NOTE — Progress Notes (Addendum)
Pt wet her pants with urine and a  small amount of stool.  I explained  To the pt this was common when coughing so forcefully.  After she was awake and not coughing, she was taken to the restroom and cleaned up.  I asked if she would like to wear a diaper and  a gown home.  Pt said she would like to do so.  Also gave pt a chuck to place in the car seat.  No problems noted in the recovery room. Maw  Pt's royal blue mask was also full of mucous and was given a new white paper mask to get home. maw

## 2019-08-29 NOTE — Progress Notes (Signed)
VS-CW Temp-JB 

## 2019-08-29 NOTE — Op Note (Signed)
[facility] Patient Name: Terri [name] Procedure Date: 08/29/2019 9:42 AM MRN: [id number] Endoscopist: Mauri Pole , MD Age: 63 Referring MD:  Date of Birth: [birth year] Gender: Female Account #: [id number] Procedure:                Upper GI endoscopy Indications:              Dysphagia Medicines:                Monitored Anesthesia Care Procedure:                Pre-Anesthesia Assessment:                           - Prior to the procedure, a History and Physical                            was performed, and patient medications and                            allergies were reviewed. The patient's tolerance of                            previous anesthesia was also reviewed. The risks                            and benefits of the procedure and the sedation                            options and risks were discussed with the patient.                            All questions were answered, and informed consent                            was obtained. Prior Anticoagulants: The patient has                            taken no previous anticoagulant or antiplatelet                            agents. ASA Grade Assessment: III - A patient with                            severe systemic disease. After reviewing the risks                            and benefits, the patient was deemed in                            satisfactory condition to undergo the procedure.                           After obtaining informed consent, the endoscope was  passed under direct vision. Throughout the                            procedure, the patient's blood pressure, pulse, and                            oxygen saturations were monitored continuously. The                            Endoscope was introduced through the mouth, and                            advanced to the second part of duodenum. The upper                            GI endoscopy was accomplished without  difficulty.                            The patient tolerated the procedure well. Scope In: Scope Out: Findings:                 The lumen of the esophagus was moderately dilated.                           Retained secretions were found in the middle third                            of the esophagus and in the lower third of the                            esophagus.                           There were esophageal mucosal changes suggestive of                            short-segment Barrett's esophagus present in the                            lower third of the esophagus. The maximum                            longitudinal extent of these mucosal changes was 2                            cm in length. Z-line at 36 cm. Mucosa was biopsied                            with a cold forceps for histology. One specimen                            bottle was sent to pathology.  An extrinsic moderate stenosis was found in the EG                            junction/proximal cardia from the gastric lap band                            [Traversed].                           The cardia, gastric body and gastric fundus                            otherwise were normal on retroflexion.                           The examined duodenum was normal. Complications:            No immediate complications. Estimated Blood Loss:     Estimated blood loss was minimal. Impression:               - Dilation in the entire esophagus.                           - Fluid in the middle third of the esophagus and in                            the lower third of the esophagus.                           - Esophageal mucosal changes suggestive of                            short-segment Barrett's esophagus. Biopsied.                           - Gastric stenosis was found at the                            gastroesophageal junction and in the cardia.                           - Normal examined  duodenum. Recommendation:           - Patient has a contact number available for                            emergencies. The signs and symptoms of potential                            delayed complications were discussed with the                            patient. Return to normal activities tomorrow.                            Written discharge instructions were provided to the  patient.                           - Resume previous diet.                           - Continue present medications.                           - Await pathology results.                           - Return to my office in 2 months. Napoleon FormKavitha V. Cadel Stairs, MD 08/29/2019 10:21:14 AM This report has been signed electronically.

## 2019-08-31 ENCOUNTER — Telehealth: Payer: Self-pay

## 2019-08-31 NOTE — Telephone Encounter (Signed)
Spoke with the patient and discussed the plan. Appointment set up for the latest available on the schedule 10/26/18 at 1:45 pm.

## 2019-08-31 NOTE — Telephone Encounter (Signed)
  Follow up Call-  Call back number 08/29/2019  Post procedure Call Back phone  # [phone]  Permission to leave phone message Yes  Some recent data might be hidden     Patient questions:  Do you have a fever, pain , or abdominal swelling? No. Pain Score  0 *  Have you tolerated food without any problems? Yes.    Have you been able to return to your normal activities? Yes.    Do you have any questions about your discharge instructions: Diet   No. Medications  No. Follow up visit  No.  Do you have questions or concerns about your Care? Yes Patient is having saline taken out of lap band tomorrow, wanted to know when to schedule next appt/procedure?  Actions: * If pain score is 4 or above: No action needed, pain <4.  1. Have you developed a fever since your procedure? No  2.   Have you had an respiratory symptoms (SOB or cough) since your procedure? No  3.   Have you tested positive for COVID 19 since your procedure No  4.   Have you had any family members/close contacts diagnosed with the COVID 19 since your procedure?  No   If yes to any of these questions please route to Joylene John, RN and Alphonsa Gin, RN.

## 2019-08-31 NOTE — Telephone Encounter (Signed)
I will see her back in office in 2 months and will plan to schedule for esophageal manometry at that time. Beth, can you please schedule office visit and inform patient? Thanks

## 2019-09-01 DIAGNOSIS — Z4651 Encounter for fitting and adjustment of gastric lap band: Secondary | ICD-10-CM | POA: Diagnosis not present

## 2019-09-01 DIAGNOSIS — Z9884 Bariatric surgery status: Secondary | ICD-10-CM | POA: Diagnosis not present

## 2019-09-01 DIAGNOSIS — Z23 Encounter for immunization: Secondary | ICD-10-CM | POA: Diagnosis not present

## 2019-09-04 ENCOUNTER — Encounter: Payer: Self-pay | Admitting: Gastroenterology

## 2019-09-04 ENCOUNTER — Other Ambulatory Visit: Payer: Self-pay | Admitting: Family Medicine

## 2019-09-04 DIAGNOSIS — Z1231 Encounter for screening mammogram for malignant neoplasm of breast: Secondary | ICD-10-CM

## 2019-09-07 DIAGNOSIS — F900 Attention-deficit hyperactivity disorder, predominantly inattentive type: Secondary | ICD-10-CM | POA: Diagnosis not present

## 2019-09-07 DIAGNOSIS — F411 Generalized anxiety disorder: Secondary | ICD-10-CM | POA: Diagnosis not present

## 2019-09-07 DIAGNOSIS — F331 Major depressive disorder, recurrent, moderate: Secondary | ICD-10-CM | POA: Diagnosis not present

## 2019-09-19 DIAGNOSIS — Z20828 Contact with and (suspected) exposure to other viral communicable diseases: Secondary | ICD-10-CM | POA: Diagnosis not present

## 2019-10-04 DIAGNOSIS — H401121 Primary open-angle glaucoma, left eye, mild stage: Secondary | ICD-10-CM | POA: Diagnosis not present

## 2019-10-04 DIAGNOSIS — H40053 Ocular hypertension, bilateral: Secondary | ICD-10-CM | POA: Diagnosis not present

## 2019-10-04 DIAGNOSIS — H25812 Combined forms of age-related cataract, left eye: Secondary | ICD-10-CM | POA: Diagnosis not present

## 2019-10-04 DIAGNOSIS — Z961 Presence of intraocular lens: Secondary | ICD-10-CM | POA: Diagnosis not present

## 2019-10-04 DIAGNOSIS — H04123 Dry eye syndrome of bilateral lacrimal glands: Secondary | ICD-10-CM | POA: Diagnosis not present

## 2019-10-07 DIAGNOSIS — Z20828 Contact with and (suspected) exposure to other viral communicable diseases: Secondary | ICD-10-CM | POA: Diagnosis not present

## 2019-10-19 DIAGNOSIS — Z9884 Bariatric surgery status: Secondary | ICD-10-CM | POA: Diagnosis not present

## 2019-10-25 ENCOUNTER — Ambulatory Visit: Payer: BC Managed Care – PPO

## 2019-10-27 ENCOUNTER — Other Ambulatory Visit: Payer: Self-pay

## 2019-10-27 ENCOUNTER — Ambulatory Visit: Payer: BC Managed Care – PPO | Admitting: Gastroenterology

## 2019-10-27 ENCOUNTER — Encounter: Payer: Self-pay | Admitting: Gastroenterology

## 2019-10-27 VITALS — BP 134/70 | HR 88 | Temp 97.8°F | Ht 66.0 in | Wt 252.2 lb

## 2019-10-27 DIAGNOSIS — R933 Abnormal findings on diagnostic imaging of other parts of digestive tract: Secondary | ICD-10-CM | POA: Diagnosis not present

## 2019-10-27 DIAGNOSIS — K219 Gastro-esophageal reflux disease without esophagitis: Secondary | ICD-10-CM

## 2019-10-27 DIAGNOSIS — K2270 Barrett's esophagus without dysplasia: Secondary | ICD-10-CM | POA: Diagnosis not present

## 2019-10-27 DIAGNOSIS — R1310 Dysphagia, unspecified: Secondary | ICD-10-CM

## 2019-10-27 DIAGNOSIS — R1319 Other dysphagia: Secondary | ICD-10-CM

## 2019-10-27 NOTE — Patient Instructions (Addendum)
If you are age 62 or older, your body mass index should be between 23-30. Your Body mass index is 40.71 kg/m. If this is out of the aforementioned range listed, please consider follow up with your Primary Care Provider.  If you are age 48 or younger, your body mass index should be between 19-25. Your Body mass index is 40.71 kg/m. If this is out of the aformentioned range listed, please consider follow up with your Primary Care Provider.  You have been scheduled for an esophageal manometry at [facility] on 11/08/2019 at 10:30am. Please arrive 30 minutes prior to your procedure for registration. You will need to go to outpatient registration (1st floor of the hospital) first. Make certain to bring your insurance cards as well as a complete list of medications.  Please remember the following:  1) Do not take any muscle relaxants, xanax (alprazolam) or ativan for 1 day prior to your test as well as the day of the test.  2) Nothing to eat or drink for 4 hours before your test.  3) Hold all diabetic medications/insulin the morning of the test. You may eat and take your medications after the test.  It will take at least 2 weeks to receive the results of this test from your physician. ------------------------------------------ ABOUT ESOPHAGEAL MANOMETRY Esophageal manometry (muh-NOM-uh-tree) is a test that gauges how well your esophagus works. Your esophagus is the long, muscular tube that connects your throat to your stomach. Esophageal manometry measures the rhythmic muscle contractions (peristalsis) that occur in your esophagus when you swallow. Esophageal manometry also measures the coordination and force exerted by the muscles of your esophagus.  During esophageal manometry, a thin, flexible tube (catheter) that contains sensors is passed through your nose, down your esophagus and into your stomach. Esophageal manometry can be helpful in diagnosing some mostly uncommon disorders that  affect your esophagus.  Why it's done Esophageal manometry is used to evaluate the movement (motility) of food through the esophagus and into the stomach. The test measures how well the circular bands of muscle (sphincters) at the top and bottom of your esophagus open and close, as well as the pressure, strength and pattern of the wave of esophageal muscle contractions that moves food along.  What you can expect Esophageal manometry is an outpatient procedure done without sedation. Most people tolerate it well. You may be asked to change into a hospital gown before the test starts.  During esophageal manometry  . While you are sitting up, a member of your health care team sprays your throat with a numbing medication or puts numbing gel in your nose or both.  . A catheter is guided through your nose into your esophagus. The catheter may be sheathed in a water-filled sleeve. It doesn't interfere with your breathing. However, your eyes may water, and you may gag. You may have a slight nosebleed from irritation.  . After the catheter is in place, you may be asked to lie on your back on an exam table, or you may be asked to remain seated.  . You then swallow small sips of water. As you do, a computer connected to the catheter records the pressure, strength and pattern of your esophageal muscle contractions.  . During the test, you'll be asked to breathe slowly and smoothly, remain as still as possible, and swallow only when you're asked to do so.  . A member of your health care team may move the catheter down into your stomach while  the catheter continues its measurements.  . The catheter then is slowly withdrawn. The test usually lasts 20 to 30 minutes.  After esophageal manometry  When your esophageal manometry is complete, you may return to your normal activities  This test typically takes 30-45 minutes to  complete. ________________________________________________________________________________    Due to recent changes in healthcare laws, you may see the results of your imaging and laboratory studies on MyChart before your provider has had a chance to review them.  We understand that in some cases there may be results that are confusing or concerning to you. Not all laboratory results come back in the same time frame and the provider may be waiting for multiple results in order to interpret others.  Please give [place] 48 hours in order for your provider to thoroughly review all the results before contacting the office for clarification of your results.    Due to recent COVID-19 restrictions implemented by our local and state authorities and in an effort to keep both patients and staff as safe as possible, our hospital system now requires COVID-19 testing prior to any scheduled hospital procedure. Please go to our [facility] location drive thru testing site ([address] [place], [place] [address]) on 11/04/2019 at  9:20 am. There will be multiple testing areas, the first checkpoint being for pre-procedure/surgery testing. Get into the right (yellow) lane that leads to the PAT testing team. You will not be billed at the time of testing but may receive a bill later depending on your insurance. The approximate cost of the test is $100. You must agree to quarantine from the time of your testing until the procedure date on 11/08/2019 . This should include staying at home with ONLY the people you live with. Avoid take-out, grocery store shopping or leaving the house for any non-emergent reason. Failure to have your COVID-19 test done on the date and time you have been scheduled will result in cancellation of procedure. Please call our office at [phone] if you have any questions.

## 2019-10-27 NOTE — Progress Notes (Signed)
P  Chief Complaint:    Dysphagia, procedure follow-up  GI History: 64 year old female with a history of gastric lap band 2005, GERD, erosive esophagitis, previously followed by Dr. [name] and most recently reestablished care with Dr. Silverio Decamp in 07/2019 for evaluation of dysphagia.  GI history as below.  [name] did the initial lap band placement in 2006. She initially lost 80 lbs and gained back 40 lbs.  However, she had been having difficulty swallowing with regurgitation for past 10 to 12 years along with constantly choking with both liquids and solids, prompting evaluation in the GI clinic in 07/2019.  -EGD 08/29/2019 by Dr. Silverio Decamp: Dilated esophagus with fluid in the middle and lower third, 2 cm NDBE, extrinsic compression at EG J/gastric cardia, otherwise normal stomach/duodenum.  Repeat EGD in 3 years  -UGI series 08/15/2019: Tiny sliding HH, well-positioned gastric band and gastric cardia.  Moderate smooth narrowing of the gastric lumen.  Diffusely patulous thoracic esophagus with moderate esophageal dysmotility and intermittent weakening of primary peristalsis with poor clearance of barium from the esophagus.  Normal gastric emptying.  -CT abdomen pelvis with contrast July 04, 2015: Postop changes of gastric lap band placement, sigmoid and descending diverticulosis otherwise no acute abdominal pathology.  -EGD October 14, 2012: By Dr. [name] showed reflux esophagitis LA grade C with linear erosions, 4 cm hiatal hernia and lap band  -Colonoscopy October 14, 2012: Sigmoid diverticulosis otherwise normal exam.  -Upper GI series March 24, 2012: Poor esophageal motility with retained contrast in the dilated esophagus, gastric band appears in appropriate position.  HPI:     Patient is a 64 y.o. female presenting to the Gastroenterology Clinic for follow-up.  Initially seen by Dr. Silverio Decamp in 07/2019 with dysphagia and choking sensation in the setting of previous lap band in  2005.  Upper GI series from 2013 demonstrated possible E GJ outflow obstruction.  Upper GI series repeated-poor esophageal motility with dilated esophagus and retained barium, with likely GE J outflow obstruction 2/2 gastric band in gastric cardia.    EGD completed in 08/2019 n/f dilated esophagus with retained fluid in the middle and lower third, short segment (2 cm) nondysplastic Barrett esophagus, extrinsic compression at the EG J/gastric cardia from band.    Plan for esophageal manometry following lap band deflation to assess motility.  Recommended repeat EGD in 3 years. Recommended referral back to Dr. Lucia Gaskins for deflation of lap band.  Today, she states she feels much improved after removal of lap band saline 3 weeks ago by Dr. Lucia Gaskins. Resolution of fluid/phlegm/throat clearing. No dysphagia. Reflux well controlled still with omeprazole 40 mg BID, but breakthrough with any missed doses.  Overall feels much better today.  Review of systems:     No chest pain, no SOB, no fevers, no urinary sx   Past Medical History:  Diagnosis Date  . ADD (attention deficit disorder)   . Allergy    seasonal  . Anxiety   . Arthritis   . Asthma   . Blood transfusion without reported diagnosis   . Cataract   . Complication of anesthesia    can be difficult to wake up/need prompts to breathe  . GERD (gastroesophageal reflux disease)    Barrett's  . Obesity   . Sleep apnea    Uses c pap, but sleep apnea resolved after surgery    Patient's surgical history, family medical history, social history, medications and allergies were all reviewed in Epic    Current Outpatient Medications  Medication Sig Dispense Refill  . Azelastine HCl 0.15 % SOLN SMARTSIG:1-2 Spray(s) Both Nares Daily    . budesonide-formoterol (SYMBICORT) 160-4.5 MCG/ACT inhaler Inhale 2 puffs into the lungs 2 (two) times daily as needed. For wheezing    . escitalopram (LEXAPRO) 20 MG tablet Take 20 mg by mouth daily.    . fluticasone  (FLONASE) 50 MCG/ACT nasal spray SMARTSIG:1-2 Spray(s) Both Nares Daily    . levalbuterol (XOPENEX HFA) 45 MCG/ACT inhaler INHALE 1-2 PUFFS EVERY 6 HOURS AS NEEDED COUGH OR WHEEZE    . levocetirizine (XYZAL) 5 MG tablet Take 2.5 mg by mouth every evening.    . lisdexamfetamine (VYVANSE) 60 MG capsule Take 60 mg by mouth every morning.     . montelukast (SINGULAIR) 10 MG tablet Take 10 mg by mouth every evening.     Marland Kitchen omeprazole (PRILOSEC) 40 MG capsule Take 1 capsule (40 mg total) by mouth 2 (two) times daily. 180 capsule 6  . omeprazole (PRILOSEC) 40 MG capsule Take 1 capsule (40 mg total) by mouth 2 (two) times daily. 90 capsule 3  . PROAIR RESPICLICK 108 (90 Base) MCG/ACT AEPB SMARTSIG:1-2 Puff(s) By Mouth Every 4-6 Hours PRN    . zolpidem (AMBIEN) 10 MG tablet Take 10 mg by mouth at bedtime as needed. For sleep     No current facility-administered medications for this visit.    Physical Exam:     LMP 09/16/2012   GENERAL:  Pleasant female in NAD PSYCH: : Cooperative, normal affect EENT:  conjunctiva pink, mucous membranes moist, neck supple without masses CARDIAC:  RRR, no murmur heard, no peripheral edema PULM: Normal respiratory effort, lungs CTA bilaterally, no wheezing ABDOMEN:  Nondistended, soft, nontender. No obvious masses, no hepatomegaly,  normal bowel sounds SKIN:  turgor, no lesions seen Musculoskeletal:  Normal muscle tone, normal strength NEURO: Alert and oriented x 3, no focal neurologic deficits   IMPRESSION and PLAN:    1) Dysphagia 2) Motility disorder on esophagram Good clinical response to removal of saline from lap band.  However, given suggestion of motility disorder on previous barium esophagram, agree with prior recommendations for EM  -Schedule Esophageal Manometry  3) GERD 4) Short segment nondysplastic Barrett's esophagus  -Resume omeprazole as currently prescribed -Resume antireflux lifestyle/dietary modifications -Repeat EGD in 3 years for  Barrett's surveillance -Certainly could have some element of stasis injury from the lap band which may improve now that has been deflated  Can follow-up with Dr. Lavon Paganini following Esophageal Manometry  I spent 35 minutes of time, including in depth chart review, independent review of results as outlined above, communicating results with the patient directly, face-to-face time with the patient, coordinating care, and ordering studies and medications as appropriate, and documentation.           Shellia Cleverly ,DO, FACG 10/27/2019, 2:05 PM

## 2019-10-30 ENCOUNTER — Ambulatory Visit
Admission: RE | Admit: 2019-10-30 | Discharge: 2019-10-30 | Disposition: A | Discharge status: Home / Self Care | Payer: BC Managed Care – PPO | Source: Ambulatory Visit | Attending: Family Medicine | Admitting: Family Medicine

## 2019-10-30 ENCOUNTER — Other Ambulatory Visit: Payer: Self-pay

## 2019-10-30 DIAGNOSIS — G4733 Obstructive sleep apnea (adult) (pediatric): Secondary | ICD-10-CM | POA: Diagnosis not present

## 2019-10-30 DIAGNOSIS — Z1231 Encounter for screening mammogram for malignant neoplasm of breast: Secondary | ICD-10-CM

## 2019-11-04 ENCOUNTER — Other Ambulatory Visit (HOSPITAL_COMMUNITY)
Admission: RE | Admit: 2019-11-04 | Discharge: 2019-11-04 | Disposition: A | Discharge status: Home / Self Care | Payer: BC Managed Care – PPO | Source: Ambulatory Visit | Attending: Gastroenterology | Admitting: Gastroenterology

## 2019-11-04 DIAGNOSIS — Z01812 Encounter for preprocedural laboratory examination: Secondary | ICD-10-CM | POA: Diagnosis not present

## 2019-11-04 DIAGNOSIS — Z20822 Contact with and (suspected) exposure to covid-19: Secondary | ICD-10-CM | POA: Diagnosis not present

## 2019-11-04 LAB — SARS CORONAVIRUS 2 (TAT 6-24 HRS): SARS Coronavirus 2: NEGATIVE

## 2019-11-08 ENCOUNTER — Ambulatory Visit (HOSPITAL_COMMUNITY)
Admission: RE | Admit: 2019-11-08 | Discharge: 2019-11-08 | Disposition: A | Discharge status: Home / Self Care | Payer: BC Managed Care – PPO | Attending: Gastroenterology | Admitting: Gastroenterology

## 2019-11-08 ENCOUNTER — Encounter (HOSPITAL_COMMUNITY)
Admission: RE | Disposition: A | Discharge status: Home / Self Care | Payer: Self-pay | Source: Home / Self Care | Attending: Gastroenterology

## 2019-11-08 DIAGNOSIS — R1310 Dysphagia, unspecified: Secondary | ICD-10-CM | POA: Diagnosis not present

## 2019-11-08 DIAGNOSIS — R1319 Other dysphagia: Secondary | ICD-10-CM

## 2019-11-08 HISTORY — PX: ESOPHAGEAL MANOMETRY: SHX5429

## 2019-11-08 SURGERY — MANOMETRY, ESOPHAGUS

## 2019-11-08 MED ORDER — LIDOCAINE VISCOUS HCL 2 % MT SOLN
OROMUCOSAL | Status: AC
  Filled 2019-11-08: qty 15

## 2019-11-08 SURGICAL SUPPLY — 2 items
FACESHIELD LNG OPTICON STERILE (SAFETY) IMPLANT
GLOVE BIO SURGEON STRL SZ8 (GLOVE) ×4 IMPLANT

## 2019-11-08 NOTE — Progress Notes (Signed)
Esophageal Manometry done per protocol. Pt tolerated fair with several attempts with patient coughing during insertion. On last attempt 2nd RN to bedside to assist with insertion. Manometry probe successfully inserted. Pt was able to control coughing and study performed. Pt tolerated well and was feeling well after probe removed with no further coughing or issues.

## 2019-11-09 ENCOUNTER — Encounter: Payer: Self-pay | Admitting: *Deleted

## 2019-11-17 DIAGNOSIS — R1319 Other dysphagia: Secondary | ICD-10-CM

## 2019-11-17 DIAGNOSIS — R1310 Dysphagia, unspecified: Secondary | ICD-10-CM

## 2019-12-02 ENCOUNTER — Ambulatory Visit: Payer: BC Managed Care – PPO | Attending: Internal Medicine

## 2019-12-02 DIAGNOSIS — Z23 Encounter for immunization: Secondary | ICD-10-CM | POA: Insufficient documentation

## 2019-12-02 NOTE — Progress Notes (Signed)
   Covid-19 Vaccination Clinic  Name:  Mckenley Birenbaum    MRN: [id number] DOB: 06/14/1956  12/02/2019  Ms. Stocks was observed post Covid-19 immunization for 15 minutes without incident. She was provided with Vaccine Information Sheet and instruction to access the V-Safe system.   Ms. Smay was instructed to call 911 with any severe reactions post vaccine: Marland Kitchen Difficulty breathing  . Swelling of face and throat  . A fast heartbeat  . A bad rash all over body  . Dizziness and weakness   Immunizations Administered    Name Date Dose VIS Date Route   Pfizer COVID-19 Vaccine 12/02/2019  3:20 PM 0.3 mL 09/08/2019 Intramuscular   Manufacturer: [employer], [employer]   Lot: [id number]   NDC: [address]-7

## 2019-12-07 DIAGNOSIS — F331 Major depressive disorder, recurrent, moderate: Secondary | ICD-10-CM | POA: Diagnosis not present

## 2019-12-07 DIAGNOSIS — F900 Attention-deficit hyperactivity disorder, predominantly inattentive type: Secondary | ICD-10-CM | POA: Diagnosis not present

## 2019-12-07 DIAGNOSIS — F411 Generalized anxiety disorder: Secondary | ICD-10-CM | POA: Diagnosis not present

## 2019-12-21 DIAGNOSIS — G4733 Obstructive sleep apnea (adult) (pediatric): Secondary | ICD-10-CM | POA: Diagnosis not present

## 2019-12-23 ENCOUNTER — Ambulatory Visit: Payer: BC Managed Care – PPO | Attending: Internal Medicine

## 2019-12-23 DIAGNOSIS — Z23 Encounter for immunization: Secondary | ICD-10-CM

## 2019-12-23 NOTE — Progress Notes (Signed)
   Covid-19 Vaccination Clinic  Name:  Jonet Mathies    MRN: [id number] DOB: 1956-07-06  12/23/2019  Ms. Tingley was observed post Covid-19 immunization for 15 minutes without incident. She was provided with Vaccine Information Sheet and instruction to access the V-Safe system.   Ms. Meas was instructed to call 911 with any severe reactions post vaccine: Marland Kitchen Difficulty breathing  . Swelling of face and throat  . A fast heartbeat  . A bad rash all over body  . Dizziness and weakness   Immunizations Administered    Name Date Dose VIS Date Route   Pfizer COVID-19 Vaccine 12/23/2019  3:29 PM 0.3 mL 09/08/2019 Intramuscular   Manufacturer: [employer], [employer]   Lot: [id number]   NDC: [address]-1

## 2019-12-26 ENCOUNTER — Other Ambulatory Visit: Payer: Self-pay | Admitting: Gastroenterology

## 2019-12-27 DIAGNOSIS — R197 Diarrhea, unspecified: Secondary | ICD-10-CM | POA: Diagnosis not present

## 2020-01-02 ENCOUNTER — Ambulatory Visit: Payer: BC Managed Care – PPO

## 2020-01-03 ENCOUNTER — Ambulatory Visit: Payer: BC Managed Care – PPO | Admitting: Gastroenterology

## 2020-01-03 ENCOUNTER — Encounter: Payer: Self-pay | Admitting: Gastroenterology

## 2020-01-03 VITALS — BP 140/78 | HR 84 | Temp 97.8°F | Ht 66.0 in | Wt 244.0 lb

## 2020-01-03 DIAGNOSIS — R1310 Dysphagia, unspecified: Secondary | ICD-10-CM | POA: Diagnosis not present

## 2020-01-03 DIAGNOSIS — R1319 Other dysphagia: Secondary | ICD-10-CM

## 2020-01-03 DIAGNOSIS — Z9884 Bariatric surgery status: Secondary | ICD-10-CM

## 2020-01-03 DIAGNOSIS — K220 Achalasia of cardia: Secondary | ICD-10-CM

## 2020-01-03 DIAGNOSIS — K2270 Barrett's esophagus without dysplasia: Secondary | ICD-10-CM

## 2020-01-03 MED ORDER — OMEPRAZOLE 40 MG PO CPDR: 40.0000 mg | DELAYED_RELEASE_CAPSULE | Freq: Two times a day (BID) | ORAL | 11 refills | Status: DC

## 2020-01-03 NOTE — Progress Notes (Signed)
Terri [name]    [id number]    [birth year]  Primary Care Physician:[name], Okey Regal, MD  Referring Physician: Shirlean Mylar, MD [address] [place],  [place] [address]   Chief complaint: Dysphagia HPI:   Feels better overall, swallowing is improving, no longer constantly regurgitating.  Occasional chocking sensation when she forgets to take omeprazole  Denies any nausea, vomiting, abdominal pain, melena or bright red blood per rectum   Esophageal manometry November 09, 2019: Normal relaxation of the EG junction with no evidence of a GJ outflow obstruction Absent esophageal peristalsis  Patient had fluid removed from the gastric lap band by Dr Ezzard Standing after EGD  EGD 08/29/2019: Dilated tortuous fluid-filled esophagus with tight EG junction.  Short segment of Barrett's esophagus.  Upper GI series August 15, 2019: Well-positioned gastric band gastric cardia, moderate smooth narrowing of gastric lumen but apparent.  Tiny sliding hiatal hernia. Moderate esophageal dysmotility with poor clearance of barium from diffusely patulous thoracic esophagus   GI Hx:  Dr Ezzard Standing did the initial lap band placement in 2006.  She has not had any adjustment in the past many years. She initially lost 80 lbs and gained back 40 lbs She has been having difficulty swallowing with regurgitation for past 10 to 12 years She is constantly choking with both liquids and solids  CT abdomen pelvis with contrast July 04, 2015: Postop changes of gastric lap band placement, sigmoid and descending diverticulosis otherwise no acute abdominal pathology.  EGD October 14, 2012: By Dr. Juanda Chance showed reflux esophagitis LA grade C with linear erosions, 4 cm hiatal hernia and lap band  Colonoscopy October 14, 2012: Sigmoid diverticulosis otherwise normal exam.  Upper GI series March 24, 2012: Poor esophageal motility with retained contrast in the dilated esophagus, gastric band appears  in appropriate position.  Outpatient Encounter Medications as of 01/03/2020  Medication Sig  . ADVAIR DISKUS 250-50 MCG/DOSE AEPB Inhale 1 puff into the lungs 2 (two) times daily.  . Azelastine HCl 0.15 % SOLN SMARTSIG:1-2 Spray(s) Both Nares Daily  . escitalopram (LEXAPRO) 20 MG tablet Take 20 mg by mouth daily.  . fluticasone (FLONASE) 50 MCG/ACT nasal spray SMARTSIG:1-2 Spray(s) Both Nares Daily  . levalbuterol (XOPENEX HFA) 45 MCG/ACT inhaler INHALE 1-2 PUFFS EVERY 6 HOURS AS NEEDED COUGH OR WHEEZE  . levocetirizine (XYZAL) 5 MG tablet Take 2.5 mg by mouth every evening.  . lisdexamfetamine (VYVANSE) 60 MG capsule Take 60 mg by mouth every morning.   . montelukast (SINGULAIR) 10 MG tablet Take 10 mg by mouth every evening.   Marland Kitchen omeprazole (PRILOSEC) 40 MG capsule TAKE 1 CAPSULE BY MOUTH TWICE A DAY  . PROAIR RESPICLICK 108 (90 Base) MCG/ACT AEPB SMARTSIG:1-2 Puff(s) By Mouth Every 4-6 Hours PRN  . zolpidem (AMBIEN) 10 MG tablet Take 10 mg by mouth at bedtime as needed. For sleep   No facility-administered encounter medications on file as of 01/03/2020.    Allergies as of 01/03/2020  . (No Known Allergies)    Past Medical History:  Diagnosis Date  . ADD (attention deficit disorder)   . Allergy    seasonal  . Anxiety   . Arthritis   . Asthma   . Blood transfusion without reported diagnosis   . Cataract   . Complication of anesthesia    can be difficult to wake up/need prompts to breathe  . GERD (gastroesophageal reflux disease)    Barrett's  . Obesity   .  Sleep apnea    Uses c pap, but sleep apnea resolved after surgery    Past Surgical History:  Procedure Laterality Date  . ABDOMINAL DEBRIDEMENT  05/26/2012  . ABDOMINOPLASTY  01/2009  . ADENOIDECTOMY    . COLONOSCOPY    . ESOPHAGEAL MANOMETRY N/A 11/08/2019   Procedure: ESOPHAGEAL MANOMETRY (EM);  Surgeon: Mauri Pole, MD;  Location: [facility];  Service: Endoscopy;  Laterality: N/A;  . KNEE ARTHROSCOPY      left  . LAPAROSCOPIC GASTRIC BANDING     05/2004  . POLYPECTOMY     post polyp bleed  . seroma    . TONSILLECTOMY    . UPPER GASTROINTESTINAL ENDOSCOPY     Barrett's  . UVULOPALATOPHARYNGOPLASTY      Family History  Problem Relation Age of Onset  . Diabetes Mother   . Depression Mother   . Heart attack Mother   . Heart Problems Father   . Esophageal cancer Paternal Uncle   . Colon cancer Neg Hx   . Stomach cancer Neg Hx   . Stroke Neg Hx   . Neuropathy Neg Hx   . Rectal cancer Neg Hx     Social History   Socioeconomic History  . Marital status: Married    Spouse name: Juanda Crumble "Chip"  . Number of children: 0  . Years of education: 61  . Highest education level: Not on file  Occupational History  . Occupation: Self-employed  Tobacco Use  . Smoking status: Former Smoker    Quit date: 11/30/1993    Years since quitting: 26.1  . Smokeless tobacco: Never Used  Substance and Sexual Activity  . Alcohol use: Yes    Alcohol/week: 0.0 standard drinks    Comment: 1-2/week  . Drug use: No  . Sexual activity: Not on file  Other Topics Concern  . Not on file  Social History Narrative   Lives with husband, Chip   Caffeine use: Daily coffee   Social Determinants of Health   Financial Resource Strain:   . Difficulty of Paying Living Expenses:   Food Insecurity:   . Worried About Charity fundraiser in the Last Year:   . Arboriculturist in the Last Year:   Transportation Needs:   . Film/video editor (Medical):   Marland Kitchen Lack of Transportation (Non-Medical):   Physical Activity:   . Days of Exercise per Week:   . Minutes of Exercise per Session:   Stress:   . Feeling of Stress :   Social Connections:   . Frequency of Communication with Friends and Family:   . Frequency of Social Gatherings with Friends and Family:   . Attends Religious Services:   . Active Member of Clubs or Organizations:   . Attends Archivist Meetings:   Marland Kitchen Marital Status:   Intimate  Partner Violence:   . Fear of Current or Ex-Partner:   . Emotionally Abused:   Marland Kitchen Physically Abused:   . Sexually Abused:       Review of systems:  All other review of systems negative except as mentioned in the HPI.   Physical Exam: Vitals:   01/03/20 0835  BP: 140/78  Pulse: 84  Temp: 97.8 F (36.6 C)   Body mass index is 39.38 kg/m. Gen:      No acute distress Neuro: alert and oriented x 3 Psych: normal mood and affect  Data Reviewed:  Reviewed labs, radiology imaging, old records and pertinent past GI work up  Assessment and Plan/Recommendations:  64 year old female with obesity, chronic GERD, Barrett's esophagus s/p gastric lap band with severe dysphagia  Improvement of dysphagia after removal of fluid from gastric lap band  She has aperistalsis based on esophageal manometry, even though no E GJ outflow obstruction based on IRP there was no relative relaxation of LES.  Will obtain repeat upper GI series to compare and assess adequate flow of barium through EG junction.  If evidence of delayed flow or persistent obstruction, will need to consider removal of the LAP-BAND surgically.  GERD and Barrett's esophagus: Continue omeprazole 40 mg twice daily, take it 30 minutes before breakfast and at dinner Antireflux measures and lifestyle modifications  Due for recall for surveillance EGD December 2023 for Barrett's esophagus without dysplasia  Average risk, due for colonoscopy for colorectal cancer screening in January 2024  Return in 6 months or sooner if needed  This visit required 32 minutes of patient care (this includes precharting, chart review, review of results, face-to-face time used for counseling as well as treatment plan and follow-up. The patient was provided an opportunity to ask questions and all were answered. The patient agreed with the plan and demonstrated an understanding of the instructions.  Iona Beard , MD    CC: Shirlean Mylar,  MD

## 2020-01-03 NOTE — Patient Instructions (Signed)
We have sent the following medications to your pharmacy for you to pick up at your convenience: omeprazole 40 mg twice daily.   You have been scheduled for an Upper GI Series at [facility] Radiology department. Your appointment is on 01/18/20 at 10:30am. Please arrive 15 minutes prior to your test for registration. Make sure not to eat or drink anything 6 hours before your test. If you need to reschedule, please call radiology at [phone]. ________________________________________________________________ An upper GI series uses x rays to help diagnose problems of the upper GI tract, which includes the esophagus, stomach, and duodenum. The duodenum is the first part of the small intestine. An upper GI series is conducted by a radiology technologist or a radiologist-a doctor who specializes in x-ray imaging-at a hospital or outpatient center. While sitting or standing in front of an x-ray machine, the patient drinks barium liquid, which is often white and has a chalky consistency and taste. The barium liquid coats the lining of the upper GI tract and makes signs of disease show up more clearly on x rays. X-ray video, called fluoroscopy, is used to view the barium liquid moving through the esophagus, stomach, and duodenum. Additional x rays and fluoroscopy are performed while the patient lies on an x-ray table. To fully coat the upper GI tract with barium liquid, the technologist or radiologist may press on the abdomen or ask the patient to change position. Patients hold still in various positions, allowing the technologist or radiologist to take x rays of the upper GI tract at different angles. If a technologist conducts the upper GI series, a radiologist will later examine the images to look for problems.  This test typically takes about 1 hour to complete. __________________________________________________________________

## 2020-01-08 DIAGNOSIS — Z961 Presence of intraocular lens: Secondary | ICD-10-CM | POA: Diagnosis not present

## 2020-01-08 DIAGNOSIS — H401121 Primary open-angle glaucoma, left eye, mild stage: Secondary | ICD-10-CM | POA: Diagnosis not present

## 2020-01-08 DIAGNOSIS — H40053 Ocular hypertension, bilateral: Secondary | ICD-10-CM | POA: Diagnosis not present

## 2020-01-08 DIAGNOSIS — H25812 Combined forms of age-related cataract, left eye: Secondary | ICD-10-CM | POA: Diagnosis not present

## 2020-01-15 DIAGNOSIS — E785 Hyperlipidemia, unspecified: Secondary | ICD-10-CM | POA: Diagnosis not present

## 2020-01-15 DIAGNOSIS — Z5181 Encounter for therapeutic drug level monitoring: Secondary | ICD-10-CM | POA: Diagnosis not present

## 2020-01-17 DIAGNOSIS — Z0000 Encounter for general adult medical examination without abnormal findings: Secondary | ICD-10-CM | POA: Diagnosis not present

## 2020-01-18 ENCOUNTER — Ambulatory Visit (HOSPITAL_COMMUNITY)
Admission: RE | Admit: 2020-01-18 | Discharge: 2020-01-18 | Disposition: A | Discharge status: Home / Self Care | Payer: BC Managed Care – PPO | Source: Ambulatory Visit | Attending: Gastroenterology | Admitting: Gastroenterology

## 2020-01-18 ENCOUNTER — Other Ambulatory Visit: Payer: Self-pay

## 2020-01-18 DIAGNOSIS — R1310 Dysphagia, unspecified: Secondary | ICD-10-CM | POA: Insufficient documentation

## 2020-01-18 DIAGNOSIS — K9509 Other complications of gastric band procedure: Secondary | ICD-10-CM | POA: Diagnosis not present

## 2020-01-18 DIAGNOSIS — R1319 Other dysphagia: Secondary | ICD-10-CM

## 2020-02-09 DIAGNOSIS — J300 Vasomotor rhinitis: Secondary | ICD-10-CM | POA: Diagnosis not present

## 2020-02-09 DIAGNOSIS — J4530 Mild persistent asthma, uncomplicated: Secondary | ICD-10-CM | POA: Diagnosis not present

## 2020-02-09 DIAGNOSIS — J301 Allergic rhinitis due to pollen: Secondary | ICD-10-CM | POA: Diagnosis not present

## 2020-02-09 DIAGNOSIS — K219 Gastro-esophageal reflux disease without esophagitis: Secondary | ICD-10-CM | POA: Diagnosis not present

## 2020-03-13 DIAGNOSIS — F411 Generalized anxiety disorder: Secondary | ICD-10-CM | POA: Diagnosis not present

## 2020-03-13 DIAGNOSIS — F900 Attention-deficit hyperactivity disorder, predominantly inattentive type: Secondary | ICD-10-CM | POA: Diagnosis not present

## 2020-03-13 DIAGNOSIS — F331 Major depressive disorder, recurrent, moderate: Secondary | ICD-10-CM | POA: Diagnosis not present

## 2020-04-10 ENCOUNTER — Encounter: Payer: Self-pay | Admitting: Gastroenterology

## 2020-04-10 ENCOUNTER — Ambulatory Visit: Payer: BC Managed Care – PPO | Admitting: Gastroenterology

## 2020-04-10 VITALS — BP 134/80 | HR 85 | Ht 66.0 in | Wt 237.0 lb

## 2020-04-10 DIAGNOSIS — K219 Gastro-esophageal reflux disease without esophagitis: Secondary | ICD-10-CM | POA: Diagnosis not present

## 2020-04-10 MED ORDER — OMEPRAZOLE 40 MG PO CPDR: 40.0000 mg | DELAYED_RELEASE_CAPSULE | Freq: Two times a day (BID) | ORAL | 11 refills | Status: AC

## 2020-04-10 NOTE — Progress Notes (Signed)
Terri [name]    [id number]    10/02/1955  Primary Care Physician:[name], Okey Regal, MD  Referring Physician: Shirlean Mylar, MD [address] [place],  [place] [address]   Chief complaint: Dysphagia, GERD HPI: Overall symptoms improved significantly, no longer has trouble swallowing and is not choking or regurgitating.  She has reflux symptoms if she forgets to take omeprazole even for a day but when she takes it overall symptoms of reflux are well controlled. Denies any nausea, vomiting, abdominal pain, melena or bright red blood per rectum   Upper GI series January 18, 2020: Contrast freely flows through gastric band device with no evidence of obstruction, mildly patulous esophagus with minimal peristalsis  Esophageal manometry November 09, 2019: Normal relaxation of the EG junction with no evidence of a GJ outflow obstruction Absent esophageal peristalsis  Patient had fluid removed from the gastric lap band by Dr Ezzard Standing after EGD  EGD 08/29/2019: Dilated tortuous fluid-filled esophagus with tight EG junction.  Short segment of Barrett's esophagus.  Upper GI series August 15, 2019: Well-positioned gastric band gastric cardia, moderate smooth narrowing of gastric lumen but apparent.  Tiny sliding hiatal hernia. Moderate esophageal dysmotility with poor clearance of barium from diffusely patulous thoracic esophagus   Dr Ezzard Standing did the initial lap band placementin 2006. She has not had any adjustment in the past many years. She initially lost 80 lbs and gained back 40 lbs She has been having difficulty swallowing with regurgitation for past 10 to 12 years She is constantly choking with both liquids and solids  CT abdomen pelvis with contrast July 04, 2015: Postop changes of gastric lap band placement, sigmoid and descending diverticulosis otherwise no acute abdominal pathology.  EGD October 14, 2012:By Dr. Juanda Chance showed reflux esophagitis LA  grade C with linear erosions, 4 cm hiatal hernia and lap band  Colonoscopy October 14, 2012:Sigmoid diverticulosis otherwise normal exam.  Upper GI series March 24, 2012: Poor esophageal motility with retained contrast in the dilated esophagus, gastric band appears in appropriate position.  Outpatient Encounter Medications as of 04/10/2020  Medication Sig  . ADVAIR DISKUS 250-50 MCG/DOSE AEPB Inhale 1 puff into the lungs 2 (two) times daily.  . Azelastine HCl 0.15 % SOLN SMARTSIG:1-2 Spray(s) Both Nares Daily  . escitalopram (LEXAPRO) 20 MG tablet Take 20 mg by mouth daily.  . fluticasone (FLONASE) 50 MCG/ACT nasal spray SMARTSIG:1-2 Spray(s) Both Nares Daily  . levalbuterol (XOPENEX HFA) 45 MCG/ACT inhaler INHALE 1-2 PUFFS EVERY 6 HOURS AS NEEDED COUGH OR WHEEZE  . levocetirizine (XYZAL) 5 MG tablet Take 2.5 mg by mouth every evening.  . lisdexamfetamine (VYVANSE) 60 MG capsule Take 60 mg by mouth every morning.   . montelukast (SINGULAIR) 10 MG tablet Take 10 mg by mouth every evening.   Marland Kitchen omeprazole (PRILOSEC) 40 MG capsule Take 1 capsule (40 mg total) by mouth 2 (two) times daily.  Marland Kitchen PROAIR RESPICLICK 108 (90 Base) MCG/ACT AEPB SMARTSIG:1-2 Puff(s) By Mouth Every 4-6 Hours PRN  . zolpidem (AMBIEN) 10 MG tablet Take 10 mg by mouth at bedtime as needed. For sleep   No facility-administered encounter medications on file as of 04/10/2020.    Allergies as of 04/10/2020  . (No Known Allergies)    Past Medical History:  Diagnosis Date  . ADD (attention deficit disorder)   . Allergy    seasonal  . Anxiety   . Arthritis   .  Asthma   . Blood transfusion without reported diagnosis   . Cataract   . Complication of anesthesia    can be difficult to wake up/need prompts to breathe  . GERD (gastroesophageal reflux disease)    Barrett's  . Obesity   . Sleep apnea    Uses c pap, but sleep apnea resolved after surgery    Past Surgical History:  Procedure Laterality Date  . ABDOMINAL  DEBRIDEMENT  05/26/2012  . ABDOMINOPLASTY  01/2009  . ADENOIDECTOMY    . COLONOSCOPY    . ESOPHAGEAL MANOMETRY N/A 11/08/2019   Procedure: ESOPHAGEAL MANOMETRY (EM);  Surgeon: Napoleon Form, MD;  Location: [facility];  Service: Endoscopy;  Laterality: N/A;  . KNEE ARTHROSCOPY     left  . LAPAROSCOPIC GASTRIC BANDING     05/2004  . POLYPECTOMY     post polyp bleed  . seroma    . TONSILLECTOMY    . UPPER GASTROINTESTINAL ENDOSCOPY     Barrett's  . UVULOPALATOPHARYNGOPLASTY      Family History  Problem Relation Age of Onset  . Diabetes Mother   . Depression Mother   . Heart attack Mother   . Heart Problems Father   . Esophageal cancer Paternal Uncle   . Colon cancer Neg Hx   . Stomach cancer Neg Hx   . Stroke Neg Hx   . Neuropathy Neg Hx   . Rectal cancer Neg Hx     Social History   Socioeconomic History  . Marital status: Married    Spouse name: Leonette Most "Chip"  . Number of children: 0  . Years of education: 69  . Highest education level: Not on file  Occupational History  . Occupation: Self-employed  Tobacco Use  . Smoking status: Former Smoker    Quit date: 11/30/1993    Years since quitting: 26.3  . Smokeless tobacco: Never Used  Vaping Use  . Vaping Use: Never used  Substance and Sexual Activity  . Alcohol use: Yes    Alcohol/week: 0.0 standard drinks    Comment: 1-2/week  . Drug use: No  . Sexual activity: Not on file  Other Topics Concern  . Not on file  Social History Narrative   Lives with husband, Chip   Caffeine use: Daily coffee   Social Determinants of Health   Financial Resource Strain:   . Difficulty of Paying Living Expenses:   Food Insecurity:   . Worried About Programme researcher, broadcasting/film/video in the Last Year:   . Barista in the Last Year:   Transportation Needs:   . Freight forwarder (Medical):   Marland Kitchen Lack of Transportation (Non-Medical):   Physical Activity:   . Days of Exercise per Week:   . Minutes of Exercise per Session:     Stress:   . Feeling of Stress :   Social Connections:   . Frequency of Communication with Friends and Family:   . Frequency of Social Gatherings with Friends and Family:   . Attends Religious Services:   . Active Member of Clubs or Organizations:   . Attends Banker Meetings:   Marland Kitchen Marital Status:   Intimate Partner Violence:   . Fear of Current or Ex-Partner:   . Emotionally Abused:   Marland Kitchen Physically Abused:   . Sexually Abused:       Review of systems:  All other review of systems negative except as mentioned in the HPI.   Physical Exam: Vitals:   04/10/20  0809  BP: 134/80  Pulse: 85   Body mass index is 38.25 kg/m. Gen:      No acute distress HEENT:  sclera anicteric Neuro: alert and oriented x 3 Psych: normal mood and affect  Data Reviewed:  Reviewed labs, radiology imaging, old records and pertinent past GI work up   Assessment and Plan/Recommendations:  64 year old very pleasant female with history of obesity s/p gastric lap band with esophageal gastric junction outflow obstruction and progressive dysphagia.  Dysphagia improved significantly after removal of fluid from the gastric lap band.  Most recent upper GI series showed good flow of contrast through EG junction with no evidence of obstruction.  GERD: Continue omeprazole 40 mg twice daily, 30 minutes before breakfast and dinner Antireflux measures and lifestyle modifications  Discussed potential adverse drug reaction with long-term PPI use including vitamin and mineral deficiency, bone loss and osteoporosis  Barrett's esophagus with no dysplasia: Due for surveillance EGD December 2023  Average risk for colorectal cancer screening, due for screening colonoscopy in January 2024  Return in 1 year or sooner if needed   The patient was provided an opportunity to ask questions and all were answered. The patient agreed with the plan and demonstrated an understanding of the instructions.  Iona Beard , MD    CC: Shirlean Mylar, MD

## 2020-04-10 NOTE — Patient Instructions (Addendum)
We will send refills of omeprazole to your pharmacy    Follow up in 1 year   Gastroesophageal Reflux Disease, Adult Gastroesophageal reflux (GER) happens when acid from the stomach flows up into the tube that connects the mouth and the stomach (esophagus). Normally, food travels down the esophagus and stays in the stomach to be digested. However, when a person has GER, food and stomach acid sometimes move back up into the esophagus. If this becomes a more serious problem, the person may be diagnosed with a disease called gastroesophageal reflux disease (GERD). GERD occurs when the reflux:  Happens often.  Causes frequent or severe symptoms.  Causes problems such as damage to the esophagus. When stomach acid comes in contact with the esophagus, the acid may cause soreness (inflammation) in the esophagus. Over time, GERD may create small holes (ulcers) in the lining of the esophagus. What are the causes? This condition is caused by a problem with the muscle between the esophagus and the stomach (lower esophageal sphincter, or LES). Normally, the LES muscle closes after food passes through the esophagus to the stomach. When the LES is weakened or abnormal, it does not close properly, and that allows food and stomach acid to go back up into the esophagus. The LES can be weakened by certain dietary substances, medicines, and medical conditions, including:  Tobacco use.  Pregnancy.  Having a hiatal hernia.  Alcohol use.  Certain foods and beverages, such as coffee, chocolate, onions, and peppermint. What increases the risk? You are more likely to develop this condition if you:  Have an increased body weight.  Have a connective tissue disorder.  Use NSAID medicines. What are the signs or symptoms? Symptoms of this condition include:  Heartburn.  Difficult or painful swallowing.  The feeling of having a lump in the throat.  Abitter taste in the mouth.  Bad breath.  Having a  large amount of saliva.  Having an upset or bloated stomach.  Belching.  Chest pain. Different conditions can cause chest pain. Make sure you see your health care provider if you experience chest pain.  Shortness of breath or wheezing.  Ongoing (chronic) cough or a night-time cough.  Wearing away of tooth enamel.  Weight loss. How is this diagnosed? Your health care provider will take a medical history and perform a physical exam. To determine if you have mild or severe GERD, your health care provider may also monitor how you respond to treatment. You may also have tests, including:  A test to examine your stomach and esophagus with a small camera (endoscopy).  A test thatmeasures the acidity level in your esophagus.  A test thatmeasures how much pressure is on your esophagus.  A barium swallow or modified barium swallow test to show the shape, size, and functioning of your esophagus. How is this treated? The goal of treatment is to help relieve your symptoms and to prevent complications. Treatment for this condition may vary depending on how severe your symptoms are. Your health care provider may recommend:  Changes to your diet.  Medicine.  Surgery. Follow these instructions at home: Eating and drinking   Follow a diet as recommended by your health care provider. This may involve avoiding foods and drinks such as: ? Coffee and tea (with or without caffeine). ? Drinks that containalcohol. ? Energy drinks and sports drinks. ? Carbonated drinks or sodas. ? Chocolate and cocoa. ? Peppermint and mint flavorings. ? Garlic and onions. ? Horseradish. ? Spicy and acidic  foods, including peppers, chili powder, curry powder, vinegar, hot sauces, and barbecue sauce. ? Citrus fruit juices and citrus fruits, such as oranges, lemons, and limes. ? Tomato-based foods, such as red sauce, chili, salsa, and pizza with red sauce. ? Fried and fatty foods, such as donuts, french fries,  potato chips, and high-fat dressings. ? High-fat meats, such as hot dogs and fatty cuts of red and white meats, such as rib eye steak, sausage, ham, and bacon. ? High-fat dairy items, such as whole milk, butter, and cream cheese.  Eat small, frequent meals instead of large meals.  Avoid drinking large amounts of liquid with your meals.  Avoid eating meals during the 2-3 hours before bedtime.  Avoid lying down right after you eat.  Do not exercise right after you eat. Lifestyle   Do not use any products that contain nicotine or tobacco, such as cigarettes, e-cigarettes, and chewing tobacco. If you need help quitting, ask your health care provider.  Try to reduce your stress by using methods such as yoga or meditation. If you need help reducing stress, ask your health care provider.  If you are overweight, reduce your weight to an amount that is healthy for you. Ask your health care provider for guidance about a safe weight loss goal. General instructions  Pay attention to any changes in your symptoms.  Take over-the-counter and prescription medicines only as told by your health care provider. Do not take aspirin, ibuprofen, or other NSAIDs unless your health care provider told you to do so.  Wear loose-fitting clothing. Do not wear anything tight around your waist that causes pressure on your abdomen.  Raise (elevate) the head of your bed about 6 inches (15 cm).  Avoid bending over if this makes your symptoms worse.  Keep all follow-up visits as told by your health care provider. This is important. Contact a health care provider if:  You have: ? New symptoms. ? Unexplained weight loss. ? Difficulty swallowing or it hurts to swallow. ? Wheezing or a persistent cough. ? A hoarse voice.  Your symptoms do not improve with treatment. Get help right away if you:  Have pain in your arms, neck, jaw, teeth, or back.  Feel sweaty, dizzy, or light-headed.  Have chest pain or  shortness of breath.  Vomit and your vomit looks like blood or coffee grounds.  Faint.  Have stool that is bloody or black.  Cannot swallow, drink, or eat. Summary  Gastroesophageal reflux happens when acid from the stomach flows up into the esophagus. GERD is a disease in which the reflux happens often, causes frequent or severe symptoms, or causes problems such as damage to the esophagus.  Treatment for this condition may vary depending on how severe your symptoms are. Your health care provider may recommend diet and lifestyle changes, medicine, or surgery.  Contact a health care provider if you have new or worsening symptoms.  Take over-the-counter and prescription medicines only as told by your health care provider. Do not take aspirin, ibuprofen, or other NSAIDs unless your health care provider told you to do so.  Keep all follow-up visits as told by your health care provider. This is important. This information is not intended to replace advice given to you by your health care provider. Make sure you discuss any questions you have with your health care provider. Document Revised: 03/23/2018 Document Reviewed: 03/23/2018 Elsevier Patient Education  [employer].  I appreciate the  opportunity to care for you  Thank You   Marsa Aris , MD

## 2020-05-13 DIAGNOSIS — H25812 Combined forms of age-related cataract, left eye: Secondary | ICD-10-CM | POA: Diagnosis not present

## 2020-05-13 DIAGNOSIS — H40022 Open angle with borderline findings, high risk, left eye: Secondary | ICD-10-CM | POA: Diagnosis not present

## 2020-05-13 DIAGNOSIS — Z961 Presence of intraocular lens: Secondary | ICD-10-CM | POA: Diagnosis not present

## 2020-05-13 DIAGNOSIS — H40053 Ocular hypertension, bilateral: Secondary | ICD-10-CM | POA: Diagnosis not present

## 2020-05-13 DIAGNOSIS — H04123 Dry eye syndrome of bilateral lacrimal glands: Secondary | ICD-10-CM | POA: Diagnosis not present

## 2020-05-24 DIAGNOSIS — L239 Allergic contact dermatitis, unspecified cause: Secondary | ICD-10-CM | POA: Diagnosis not present

## 2020-05-24 DIAGNOSIS — D2262 Melanocytic nevi of left upper limb, including shoulder: Secondary | ICD-10-CM | POA: Diagnosis not present

## 2020-06-05 DIAGNOSIS — Z9884 Bariatric surgery status: Secondary | ICD-10-CM | POA: Diagnosis not present

## 2020-06-18 DIAGNOSIS — F900 Attention-deficit hyperactivity disorder, predominantly inattentive type: Secondary | ICD-10-CM | POA: Diagnosis not present

## 2020-06-18 DIAGNOSIS — F411 Generalized anxiety disorder: Secondary | ICD-10-CM | POA: Diagnosis not present

## 2020-06-18 DIAGNOSIS — F331 Major depressive disorder, recurrent, moderate: Secondary | ICD-10-CM | POA: Diagnosis not present

## 2020-07-19 DIAGNOSIS — E785 Hyperlipidemia, unspecified: Secondary | ICD-10-CM | POA: Diagnosis not present

## 2020-07-19 DIAGNOSIS — Z5181 Encounter for therapeutic drug level monitoring: Secondary | ICD-10-CM | POA: Diagnosis not present

## 2020-08-14 DIAGNOSIS — M25562 Pain in left knee: Secondary | ICD-10-CM | POA: Diagnosis not present

## 2020-09-05 DIAGNOSIS — M1712 Unilateral primary osteoarthritis, left knee: Secondary | ICD-10-CM | POA: Diagnosis not present

## 2020-09-10 DIAGNOSIS — F331 Major depressive disorder, recurrent, moderate: Secondary | ICD-10-CM | POA: Diagnosis not present

## 2020-09-10 DIAGNOSIS — F900 Attention-deficit hyperactivity disorder, predominantly inattentive type: Secondary | ICD-10-CM | POA: Diagnosis not present

## 2020-09-10 DIAGNOSIS — F411 Generalized anxiety disorder: Secondary | ICD-10-CM | POA: Diagnosis not present

## 2020-10-04 ENCOUNTER — Encounter (HOSPITAL_COMMUNITY): Payer: Self-pay

## 2020-10-04 NOTE — Progress Notes (Addendum)
PCP - Shirlean Mylar, MD Cardiologist - no  PPM/ICD -  Device Orders -  Rep Notified -   Chest x-ray -  EKG -  Stress Test -  ECHO -  Cardiac Cath -   Sleep Study -  CPAP - yes  Fasting Blood Sugar -  Checks Blood Sugar _____ times a day  Blood Thinner Instructions: Aspirin Instructions:  ERAS Protcol - PRE-SURGERY Ensure   COVID TEST- 1-20  Activity No SOB with housework Anesthesia review: OSA  Patient denies shortness of breath, fever, cough and chest pain at PAT appointment  NONE   All instructions explained to the patient, with a verbal understanding of the material. Patient agrees to go over the instructions while at home for a better understanding. Patient also instructed to self quarantine after being tested for COVID-19. The opportunity to ask questions was provided.

## 2020-10-04 NOTE — Patient Instructions (Signed)
DUE TO COVID-19 ONLY ONE VISITOR IS ALLOWED TO COME WITH YOU AND STAY IN THE WAITING ROOM ONLY DURING PRE OP AND PROCEDURE DAY OF SURGERY. THE 1 VISITOR  MAY VISIT WITH YOU AFTER SURGERY IN YOUR PRIVATE ROOM DURING VISITING HOURS ONLY!  YOU NEED TO HAVE A COVID 19 TEST ON__1-20-22_____ @_______ , THIS TEST MUST BE DONE BEFORE SURGERY,  COVID TESTING SITE [address] [place] , IT IS ON THE RIGHT GOING OUT [address] APPROXIMATELY  2 MINUTES PAST [employer] ON THE RIGHT. ONCE YOUR COVID TEST IS COMPLETED,  PLEASE BEGIN THE QUARANTINE INSTRUCTIONS AS OUTLINED IN YOUR HANDOUT.                Terri [name]  10/04/2020   Your procedure is scheduled on: 10-21-20   Report to [facility] Main  Entrance   Report to admitting at            0900 AM     Call this number if you have problems the morning of surgery [phone]    Remember: NO SOLID FOOD AFTER MIDNIGHT THE NIGHT PRIOR TO SURGERY. NOTHING BY MOUTH EXCEPT CLEAR LIQUIDS UNTIL . PLEASE FINISH ENSURE DRINK PER SURGEON ORDER  WHICH NEEDS TO BE COMPLETED AT      0830 am .     CLEAR LIQUID DIET Water                                                                Black Coffee and tea, regular and decaf                              Plain Jell-O any favor except red or purple                                        Fruit ices (not with fruit pulp)                                      Iced Popsicles                                    Carbonated beverages, regular and diet                                    Cranberry, grape and apple juices Sports drinks like Gatorade Lightly seasoned clear broth or consume(fat free) Sugar, honey syrup   _____________________________________________________________________    BRUSH YOUR TEETH MORNING OF SURGERY AND RINSE YOUR MOUTH OUT, NO CHEWING GUM CANDY OR MINTS.     Take these medicines the morning of surgery with A SIP OF WATER: omeprazole, inhalers bring  with you                                 You may not  have any metal on your body including hair pins and              piercings  Do not wear jewelry, make-up, lotions, powders or perfumes, deodorant             Do not wear nail polish on your fingernails.  Do not shave  48 hours prior to surgery.     Do not bring valuables to the hospital. [facility] IS NOT             RESPONSIBLE   FOR VALUABLES.  Contacts, dentures or bridgework may not be worn into surgery.      Patients discharged the day of surgery will not be allowed to drive home. IF YOU ARE HAVING SURGERY AND GOING HOME THE SAME DAY, YOU MUST HAVE AN ADULT TO DRIVE YOU HOME AND BE WITH YOU FOR 24 HOURS. YOU MAY GO HOME BY TAXI OR UBER OR ORTHERWISE, BUT AN ADULT MUST ACCOMPANY YOU HOME AND STAY WITH YOU FOR 24 HOURS.  Name and phone number of your driver:  Special Instructions: N/A              Please read over the following fact sheets you were given: _____________________________________________________________________             [facility] - Preparing for Surgery Before surgery, you can play an important role.  Because skin is not sterile, your skin needs to be as free of germs as possible.  You can reduce the number of germs on your skin by washing with CHG (chlorahexidine gluconate) soap before surgery.  CHG is an antiseptic cleaner which kills germs and bonds with the skin to continue killing germs even after washing. Please DO NOT use if you have an allergy to CHG or antibacterial soaps.  If your skin becomes reddened/irritated stop using the CHG and inform your nurse when you arrive at Short Stay. Do not shave (including legs and underarms) for at least 48 hours prior to the first CHG shower.  You may shave your face/neck. Please follow these instructions carefully:  1.  Shower with CHG Soap the night before surgery and the  morning of Surgery.  2.  If you choose to wash your hair, wash your hair first as usual with  your  normal  shampoo.  3.  After you shampoo, rinse your hair and body thoroughly to remove the  shampoo.                           4.  Use CHG as you would any other liquid soap.  You can apply chg directly  to the skin and wash                       Gently with a scrungie or clean washcloth.  5.  Apply the CHG Soap to your body ONLY FROM THE NECK DOWN.   Do not use on face/ open                           Wound or open sores. Avoid contact with eyes, ears mouth and genitals (private parts).                       Wash face,  Genitals (private parts) with your normal soap.  6.  Wash thoroughly, paying special attention to the area where your surgery  will be performed.  7.  Thoroughly rinse your body with warm water from the neck down.  8.  DO NOT shower/wash with your normal soap after using and rinsing off  the CHG Soap.                9.  Pat yourself dry with a clean towel.            10.  Wear clean pajamas.            11.  Place clean sheets on your bed the night of your first shower and do not  sleep with pets. Day of Surgery : Do not apply any lotions/deodorants the morning of surgery.  Please wear clean clothes to the hospital/surgery center.  FAILURE TO FOLLOW THESE INSTRUCTIONS MAY RESULT IN THE CANCELLATION OF YOUR SURGERY PATIENT SIGNATURE_________________________________  NURSE SIGNATURE__________________________________  ________________________________________________________________________   Terri [name]  An incentive spirometer is a tool that can help keep your lungs clear and active. This tool measures how well you are filling your lungs with each breath. Taking long deep breaths may help reverse or decrease the chance of developing breathing (pulmonary) problems (especially infection) following:  A long period of time when you are unable to move or be active. BEFORE THE PROCEDURE   If the spirometer includes an indicator to show your best effort,  your nurse or respiratory therapist will set it to a desired goal.  If possible, sit up straight or lean slightly forward. Try not to slouch.  Hold the incentive spirometer in an upright position. INSTRUCTIONS FOR USE  1. Sit on the edge of your bed if possible, or sit up as far as you can in bed or on a chair. 2. Hold the incentive spirometer in an upright position. 3. Breathe out normally. 4. Place the mouthpiece in your mouth and seal your lips tightly around it. 5. Breathe in slowly and as deeply as possible, raising the piston or the ball toward the top of the column. 6. Hold your breath for 3-5 seconds or for as long as possible. Allow the piston or ball to fall to the bottom of the column. 7. Remove the mouthpiece from your mouth and breathe out normally. 8. Rest for a few seconds and repeat Steps 1 through 7 at least 10 times every 1-2 hours when you are awake. Take your time and take a few normal breaths between deep breaths. 9. The spirometer may include an indicator to show your best effort. Use the indicator as a goal to work toward during each repetition. 10. After each set of 10 deep breaths, practice coughing to be sure your lungs are clear. If you have an incision (the cut made at the time of surgery), support your incision when coughing by placing a pillow or rolled up towels firmly against it. Once you are able to get out of bed, walk around indoors and cough well. You may stop using the incentive spirometer when instructed by your caregiver.  RISKS AND COMPLICATIONS  Take your time so you do not get dizzy or light-headed.  If you are in pain, you may need to take or ask for pain medication before doing incentive spirometry. It is harder to take a deep breath if you are having pain. AFTER USE  Rest and breathe slowly and easily.  It can be helpful to keep track of a log of your  progress. Your caregiver can provide you with a simple table to help with this. If you are  using the spirometer at home, follow these instructions: [place] IF:   You are having difficultly using the spirometer.  You have trouble using the spirometer as often as instructed.  Your pain medication is not giving enough relief while using the spirometer.  You develop fever of 100.5 F (38.1 C) or higher. SEEK IMMEDIATE MEDICAL CARE IF:   You cough up bloody sputum that had not been present before.  You develop fever of 102 F (38.9 C) or greater.  You develop worsening pain at or near the incision site. MAKE SURE YOU:   Understand these instructions.  Will watch your condition.  Will get help right away if you are not doing well or get worse. Document Released: 01/25/2007 Document Revised: 12/07/2011 Document Reviewed: 03/28/2007 [facility] Patient Information 2014 [place], [place].   ________________________________________________________________________

## 2020-10-08 DIAGNOSIS — M1712 Unilateral primary osteoarthritis, left knee: Secondary | ICD-10-CM | POA: Diagnosis not present

## 2020-10-08 DIAGNOSIS — Z01818 Encounter for other preprocedural examination: Secondary | ICD-10-CM | POA: Diagnosis not present

## 2020-10-09 ENCOUNTER — Encounter (HOSPITAL_COMMUNITY)
Admission: RE | Admit: 2020-10-09 | Discharge: 2020-10-09 | Disposition: A | Discharge status: Home / Self Care | Payer: BC Managed Care – PPO | Source: Ambulatory Visit | Attending: Orthopedic Surgery | Admitting: Orthopedic Surgery

## 2020-10-09 ENCOUNTER — Encounter (HOSPITAL_COMMUNITY): Payer: Self-pay

## 2020-10-09 ENCOUNTER — Other Ambulatory Visit: Payer: Self-pay

## 2020-10-09 ENCOUNTER — Encounter (INDEPENDENT_AMBULATORY_CARE_PROVIDER_SITE_OTHER): Payer: Self-pay

## 2020-10-09 DIAGNOSIS — Z01812 Encounter for preprocedural laboratory examination: Secondary | ICD-10-CM | POA: Insufficient documentation

## 2020-10-09 HISTORY — DX: Pneumonia, unspecified organism: J18.9

## 2020-10-09 LAB — CBC
HCT: 39 % (ref 36.0–46.0)
Hemoglobin: 12.9 g/dL (ref 12.0–15.0)
MCH: 32 pg (ref 26.0–34.0)
MCHC: 33.1 g/dL (ref 30.0–36.0)
MCV: 96.8 fL (ref 80.0–100.0)
Platelets: 258 10*3/uL (ref 150–400)
RBC: 4.03 MIL/uL (ref 3.87–5.11)
RDW: 12.2 % (ref 11.5–15.5)
WBC: 5.8 10*3/uL (ref 4.0–10.5)
nRBC: 0 % (ref 0.0–0.2)

## 2020-10-09 LAB — COMPREHENSIVE METABOLIC PANEL
ALT: 27 U/L (ref 0–44)
AST: 29 U/L (ref 15–41)
Albumin: 4 g/dL (ref 3.5–5.0)
Alkaline Phosphatase: 75 U/L (ref 38–126)
Anion gap: 9 (ref 5–15)
BUN: 20 mg/dL (ref 8–23)
CO2: 25 mmol/L (ref 22–32)
Calcium: 9.1 mg/dL (ref 8.9–10.3)
Chloride: 104 mmol/L (ref 98–111)
Creatinine, Ser: 0.63 mg/dL (ref 0.44–1.00)
GFR, Estimated: >60 mL/min (ref >60)
Glucose, Bld: 91 mg/dL (ref 70–99)
Potassium: 4.5 mmol/L (ref 3.5–5.1)
Sodium: 138 mmol/L (ref 135–145)
Total Bilirubin: 0.5 mg/dL (ref 0.3–1.2)
Total Protein: 6.5 g/dL (ref 6.5–8.1)

## 2020-10-09 LAB — PROTIME-INR
INR: 1 (ref 0.8–1.2)
Prothrombin Time: 12.8 seconds (ref 11.4–15.2)

## 2020-10-09 LAB — TYPE AND SCREEN
ABO/RH(D): O POS
Antibody Screen: NEGATIVE

## 2020-10-09 LAB — SURGICAL PCR SCREEN
MRSA, PCR: NEGATIVE
Staphylococcus aureus: NEGATIVE

## 2020-10-09 LAB — APTT: aPTT: 31 seconds (ref 24–36)

## 2020-10-17 ENCOUNTER — Inpatient Hospital Stay (HOSPITAL_COMMUNITY): Admission: RE | Admit: 2020-10-17 | Payer: BC Managed Care – PPO | Source: Ambulatory Visit

## 2020-11-11 DIAGNOSIS — G4733 Obstructive sleep apnea (adult) (pediatric): Secondary | ICD-10-CM | POA: Diagnosis not present

## 2020-11-22 NOTE — Progress Notes (Signed)
Need new consent form order in epic for surgery on 12/03/2019 along iwht lab orders.  Thanks.

## 2020-11-22 NOTE — Progress Notes (Signed)
DUE TO COVID-19 ONLY ONE VISITOR IS ALLOWED TO COME WITH YOU AND STAY IN THE WAITING ROOM ONLY DURING PRE OP AND PROCEDURE DAY OF SURGERY. THE 1 VISITOR  MAY VISIT WITH YOU AFTER SURGERY IN YOUR PRIVATE ROOM DURING VISITING HOURS ONLY!  YOU NEED TO HAVE A COVID 19 TEST ON__3/11/2020 _____ @_______ , THIS TEST MUST BE DONE BEFORE SURGERY,  COVID TESTING SITE [address] [place] , IT IS ON THE RIGHT GOING OUT [address] APPROXIMATELY  2 MINUTES PAST [employer] ON THE RIGHT. ONCE YOUR COVID TEST IS COMPLETED,  PLEASE BEGIN THE QUARANTINE INSTRUCTIONS AS OUTLINED IN YOUR HANDOUT.                Terri [name]  11/22/2020   Your procedure is scheduled on: 12/02/2020   Report to [facility] Main  Entrance   Report to admitting at    0800  AM     Call this number if you have problems the morning of surgery [phone]    REMEMBER: NO  SOLID FOOD CANDY OR GUM AFTER MIDNIGHT. CLEAR LIQUIDS UNTIL    0730am        . NOTHING BY MOUTH EXCEPT CLEAR LIQUIDS UNTIL    . PLEASE FINISH ENSURE DRINK PER SURGEON ORDER  WHICH NEEDS TO BE COMPLETED AT   0730am   .      CLEAR LIQUID DIET   Foods Allowed                                                                    Coffee and tea, regular and decaf                            Fruit ices (not with fruit pulp)                                      Iced Popsicles                                    Carbonated beverages, regular and diet                                    Cranberry, grape and apple juices Sports drinks like Gatorade Lightly seasoned clear broth or consume(fat free) Sugar, honey syrup ___________________________________________________________________      BRUSH YOUR TEETH MORNING OF SURGERY AND RINSE YOUR MOUTH OUT, NO CHEWING GUM CANDY OR MINTS.     Take these medicines the morning of surgery with A SIP OF WATER: Inhalers as usual and bring, lexapro, flonase, prilosec   DO NOT TAKE ANY DIABETIC  MEDICATIONS DAY OF YOUR SURGERY                               You may not have any metal on your body including hair pins and  piercings  Do not wear jewelry, make-up, lotions, powders or perfumes, deodorant             Do not wear nail polish on your fingernails.  Do not shave  48 hours prior to surgery.              Men may shave face and neck.   Do not bring valuables to the hospital. [place].  Contacts, dentures or bridgework may not be worn into surgery.  Leave suitcase in the car. After surgery it may be brought to your room.     Patients discharged the day of surgery will not be allowed to drive home. IF YOU ARE HAVING SURGERY AND GOING HOME THE SAME DAY, YOU MUST HAVE AN ADULT TO DRIVE YOU HOME AND BE WITH YOU FOR 24 HOURS. YOU MAY GO HOME BY TAXI OR UBER OR ORTHERWISE, BUT AN ADULT MUST ACCOMPANY YOU HOME AND STAY WITH YOU FOR 24 HOURS.  Name and phone number of your driver:  Special Instructions: N/A              Please read over the following fact sheets you were given: _____________________________________________________________________  [facility] - Preparing for Surgery Before surgery, you can play an important role.  Because skin is not sterile, your skin needs to be as free of germs as possible.  You can reduce the number of germs on your skin by washing with CHG (chlorahexidine gluconate) soap before surgery.  CHG is an antiseptic cleaner which kills germs and bonds with the skin to continue killing germs even after washing. Please DO NOT use if you have an allergy to CHG or antibacterial soaps.  If your skin becomes reddened/irritated stop using the CHG and inform your nurse when you arrive at Short Stay. Do not shave (including legs and underarms) for at least 48 hours prior to the first CHG shower.  You may shave your face/neck. Please follow these instructions carefully:  1.  Shower with CHG Soap the night  before surgery and the  morning of Surgery.  2.  If you choose to wash your hair, wash your hair first as usual with your  normal  shampoo.  3.  After you shampoo, rinse your hair and body thoroughly to remove the  shampoo.                           4.  Use CHG as you would any other liquid soap.  You can apply chg directly  to the skin and wash                       Gently with a scrungie or clean washcloth.  5.  Apply the CHG Soap to your body ONLY FROM THE NECK DOWN.   Do not use on face/ open                           Wound or open sores. Avoid contact with eyes, ears mouth and genitals (private parts).                       Wash face,  Genitals (private parts) with your normal soap.             6.  Wash thoroughly, paying special attention to the area where your surgery  will be performed.  7.  Thoroughly rinse your body with warm water from the neck down.  8.  DO NOT shower/wash with your normal soap after using and rinsing off  the CHG Soap.                9.  Pat yourself dry with a clean towel.            10.  Wear clean pajamas.            11.  Place clean sheets on your bed the night of your first shower and do not  sleep with pets. Day of Surgery : Do not apply any lotions/deodorants the morning of surgery.  Please wear clean clothes to the hospital/surgery center.  FAILURE TO FOLLOW THESE INSTRUCTIONS MAY RESULT IN THE CANCELLATION OF YOUR SURGERY PATIENT SIGNATURE_________________________________  NURSE SIGNATURE__________________________________  ________________________________________________________________________

## 2020-11-26 ENCOUNTER — Encounter (HOSPITAL_COMMUNITY)
Admission: RE | Admit: 2020-11-26 | Discharge: 2020-11-26 | Disposition: A | Discharge status: Home / Self Care | Payer: BC Managed Care – PPO | Source: Ambulatory Visit | Attending: Orthopedic Surgery | Admitting: Orthopedic Surgery

## 2020-11-26 ENCOUNTER — Encounter (INDEPENDENT_AMBULATORY_CARE_PROVIDER_SITE_OTHER): Payer: Self-pay

## 2020-11-26 ENCOUNTER — Encounter (HOSPITAL_COMMUNITY): Payer: Self-pay

## 2020-11-26 ENCOUNTER — Other Ambulatory Visit: Payer: Self-pay

## 2020-11-26 DIAGNOSIS — Z01812 Encounter for preprocedural laboratory examination: Secondary | ICD-10-CM | POA: Insufficient documentation

## 2020-11-26 LAB — CBC
HCT: 41.3 % (ref 36.0–46.0)
Hemoglobin: 13.5 g/dL (ref 12.0–15.0)
MCH: 31.5 pg (ref 26.0–34.0)
MCHC: 32.7 g/dL (ref 30.0–36.0)
MCV: 96.5 fL (ref 80.0–100.0)
Platelets: 244 10*3/uL (ref 150–400)
RBC: 4.28 MIL/uL (ref 3.87–5.11)
RDW: 12.1 % (ref 11.5–15.5)
WBC: 4.8 10*3/uL (ref 4.0–10.5)
nRBC: 0 % (ref 0.0–0.2)

## 2020-11-26 LAB — COMPREHENSIVE METABOLIC PANEL
ALT: 28 U/L (ref 0–44)
AST: 30 U/L (ref 15–41)
Albumin: 4.2 g/dL (ref 3.5–5.0)
Alkaline Phosphatase: 76 U/L (ref 38–126)
Anion gap: 6 (ref 5–15)
BUN: 16 mg/dL (ref 8–23)
CO2: 30 mmol/L (ref 22–32)
Calcium: 9.2 mg/dL (ref 8.9–10.3)
Chloride: 104 mmol/L (ref 98–111)
Creatinine, Ser: 0.65 mg/dL (ref 0.44–1.00)
GFR, Estimated: >60 mL/min (ref >60)
Glucose, Bld: 105 mg/dL — ABNORMAL HIGH (ref 70–99)
Potassium: 4.6 mmol/L (ref 3.5–5.1)
Sodium: 140 mmol/L (ref 135–145)
Total Bilirubin: 0.5 mg/dL (ref 0.3–1.2)
Total Protein: 7.3 g/dL (ref 6.5–8.1)

## 2020-11-26 LAB — SURGICAL PCR SCREEN
MRSA, PCR: NEGATIVE
Staphylococcus aureus: NEGATIVE

## 2020-11-26 LAB — APTT: aPTT: 32 seconds (ref 24–36)

## 2020-11-26 LAB — PROTIME-INR
INR: 0.9 (ref 0.8–1.2)
Prothrombin Time: 12 seconds (ref 11.4–15.2)

## 2020-11-26 NOTE — Progress Notes (Addendum)
Anesthesia Review:  PCP: DR Shirlean Mylar Clearance - [place] [employer] covering for DR Hyman Hopes- dated 10/08/20 with LOV note on chart  Cardiologist : Chest x-ray : EKG : 10/08/2020 on chasrt  Echo : Stress test: Cardiac Cath :  Activity level:  Sleep Study/ CPAP : Fasting Blood Sugar :      / Checks Blood Sugar -- times a day:   Blood Thinner/ Instructions /Last Dose: ASA / Instructions/ Last Dose : [id number]/11/22 on chart

## 2020-11-28 ENCOUNTER — Other Ambulatory Visit (HOSPITAL_COMMUNITY)
Admission: RE | Admit: 2020-11-28 | Discharge: 2020-11-28 | Disposition: A | Discharge status: Home / Self Care | Payer: BC Managed Care – PPO | Source: Ambulatory Visit | Attending: Orthopedic Surgery | Admitting: Orthopedic Surgery

## 2020-11-28 DIAGNOSIS — Z01812 Encounter for preprocedural laboratory examination: Secondary | ICD-10-CM | POA: Diagnosis not present

## 2020-11-28 DIAGNOSIS — Z20822 Contact with and (suspected) exposure to covid-19: Secondary | ICD-10-CM | POA: Diagnosis not present

## 2020-11-28 LAB — SARS CORONAVIRUS 2 (TAT 6-24 HRS): SARS Coronavirus 2: NEGATIVE

## 2020-12-01 MED ORDER — BUPIVACAINE LIPOSOME 1.3 % IJ SUSP
20.0000 mL | Freq: Once | INTRAMUSCULAR | Status: DC
  Filled 2020-12-01: qty 20

## 2020-12-01 NOTE — H&P (Signed)
TOTAL KNEE ADMISSION H&P  Patient is being admitted for left total knee arthroplasty.  Subjective:  Chief Complaint:left knee pain.  HPI: Terri [name], 65 y.o. female, has a history of pain and functional disability in the left knee due to arthritis and has failed non-surgical conservative treatments for greater than 12 weeks to includecorticosteriod injections and viscosupplementation injections.  Onset of symptoms was gradual, starting 2 years ago with gradually worsening course since that time. The patient noted prior procedures on the knee to include  arthroscopy and menisectomy on the left knee(s).  Patient currently rates pain in the left knee(s) at 8 out of 10 with activity. Patient has worsening of pain with activity and weight bearing and pain that interferes with activities of daily living.  Patient has evidence of joint space narrowing by imaging studies.  There is no active infection.  Patient Active Problem List   Diagnosis Date Noted  . Esophageal dysphagia   . Left leg weakness 04/01/2016  . Left leg paresthesias 04/01/2016  . Noninfected postoperative seroma, lower anterior abdominal wall 04/14/2012  . History of laparoscopic adjustable gastric banding, 06/16/2004. 12/01/2011  . GERD 04/15/2010  . NAUSEA AND VOMITING 04/15/2010  . DYSPHAGIA 04/15/2010  . BARIATRIC SURGERY STATUS 04/15/2010   Past Medical History:  Diagnosis Date  . ADD (attention deficit disorder)   . Allergy    seasonal  . Arthritis   . Asthma    mild  . Blood transfusion without reported diagnosis   . Cataract   . Complication of anesthesia    can be difficult to wake up/need prompts to breathe  . GERD (gastroesophageal reflux disease)    Barrett's  . Obesity   . Pneumonia   . Sleep apnea    Uses c pap, but sleep apnea resolved after surgery    Past Surgical History:  Procedure Laterality Date  . ABDOMINAL DEBRIDEMENT  05/26/2012  . ABDOMINOPLASTY  01/2009  . ADENOIDECTOMY    . COLONOSCOPY     . ESOPHAGEAL MANOMETRY N/A 11/08/2019   Procedure: ESOPHAGEAL MANOMETRY (EM);  Surgeon: Napoleon Form, MD;  Location: [facility];  Service: Endoscopy;  Laterality: N/A;  . KNEE ARTHROSCOPY     left  . LAPAROSCOPIC GASTRIC BANDING     05/2004  . POLYPECTOMY     post polyp bleed  . seroma    . TONSILLECTOMY    . UPPER GASTROINTESTINAL ENDOSCOPY     Barrett's  . UVULOPALATOPHARYNGOPLASTY      Current Facility-Administered Medications  Medication Dose Route Frequency Provider Last Rate Last Admin  . [START ON 12/02/2020] bupivacaine liposome (EXPAREL) 1.3 % injection 266 mg  20 mL Other Once Ollen Gross, MD       Current Outpatient Medications  Medication Sig Dispense Refill Last Dose  . ADVAIR DISKUS 250-50 MCG/DOSE AEPB Inhale 1 puff into the lungs 2 (two) times daily.     . Azelastine HCl 0.15 % SOLN Place 1 spray into the nose daily.     Marland Kitchen escitalopram (LEXAPRO) 20 MG tablet Take 20 mg by mouth daily.     . fluticasone (FLONASE) 50 MCG/ACT nasal spray Place 1-2 sprays into both nostrils daily.     Marland Kitchen levalbuterol (XOPENEX HFA) 45 MCG/ACT inhaler Inhale 1-2 puffs into the lungs every 6 (six) hours as needed for shortness of breath or wheezing.     Marland Kitchen levocetirizine (XYZAL) 5 MG tablet Take 2.5 mg by mouth every evening.     . lisdexamfetamine (VYVANSE) 60 MG  capsule Take 60 mg by mouth every morning.     . montelukast (SINGULAIR) 10 MG tablet Take 10 mg by mouth every evening.      Marland Kitchen omeprazole (PRILOSEC) 40 MG capsule Take 1 capsule (40 mg total) by mouth 2 (two) times daily. 60 capsule 11   . zolpidem (AMBIEN) 10 MG tablet Take 10 mg by mouth at bedtime as needed for sleep. For sleep      No Known Allergies  Social History   Tobacco Use  . Smoking status: Former Smoker    Quit date: 11/30/1993    Years since quitting: 27.0  . Smokeless tobacco: Never Used  Substance Use Topics  . Alcohol use: Yes    Alcohol/week: 0.0 standard drinks    Comment: rarely    Family  History  Problem Relation Age of Onset  . Diabetes Mother   . Depression Mother   . Heart attack Mother   . Heart Problems Father   . Esophageal cancer Paternal Uncle   . Colon cancer Neg Hx   . Stomach cancer Neg Hx   . Stroke Neg Hx   . Neuropathy Neg Hx   . Rectal cancer Neg Hx      Review of Systems  Constitutional: Negative for chills and fever.  Respiratory: Negative for cough and shortness of breath.   Cardiovascular: Negative for chest pain.  Gastrointestinal: Negative for nausea and vomiting.  Musculoskeletal: Positive for arthralgias.    Objective:  Physical Exam Patient is a 65 year old female.  Well nourished and well developed. General: Alert and oriented x3, cooperative and pleasant, no acute distress. Head: normocephalic, atraumatic, neck supple. Eyes: EOMI. Respiratory: breath sounds clear in all fields, no wheezing, rales, or rhonchi. Cardiovascular: Regular rate and rhythm, no murmurs, gallops or rubs.  Musculoskeletal:  Left Knee Exam: No effusion present. No swelling present. Slight varus deformity. The range of motion is: 5 to 130 degrees. Moderate crepitus on range of motion of the knee. Positive medial joint line tenderness. No lateral joint line tenderness. Stable knee.  Calves soft and nontender. Motor function intact in LE. Strength 5/5 LE bilaterally. Neuro: Distal pulses 2+. Sensation to light touch intact in LE.  Vital signs in last 24 hours:    Labs:   Estimated body mass index is 39.22 kg/m as calculated from the following:   Height as of 10/09/20: 5\' 6"  (1.676 m).   Weight as of 10/09/20: 110.2 kg.   Imaging Review Plain radiographs demonstrate severe degenerative joint disease of the left knee(s). The overall alignment isneutral. The bone quality appears to be adequate for age and reported activity level.      Assessment/Plan:  End stage arthritis, left knee   The patient history, physical examination, clinical  judgment of the provider and imaging studies are consistent with end stage degenerative joint disease of the left knee(s) and total knee arthroplasty is deemed medically necessary. The treatment options including medical management, injection therapy arthroscopy and arthroplasty were discussed at length. The risks and benefits of total knee arthroplasty were presented and reviewed. The risks due to aseptic loosening, infection, stiffness, patella tracking problems, thromboembolic complications and other imponderables were discussed. The patient acknowledged the explanation, agreed to proceed with the plan and consent was signed. Patient is being admitted for inpatient treatment for surgery, pain control, PT, OT, prophylactic antibiotics, VTE prophylaxis, progressive ambulation and ADL's and discharge planning. The patient is planning to be discharged home.  Therapy Plans: outpatient therapy at [facility]  Disposition: Home with husband & friends Planned DVT Prophylaxis: Xarelto 10mg  daily (hx of lap band) DME needed: none PCP: Dr. , appointment on 10/08/20 TXA: IV Allergies: none Anesthesia Concerns: none BMI: 39 Not diabetic  Other: - staying 1 night, sleep apnea - Sending pain meds ahead of time   Patient's anticipated LOS is less than 2 midnights, meeting these requirements: - Younger than 8 - Lives within 1 hour of care - Has a competent adult at home to recover with post-op recover - NO history of  - Chronic pain requiring opiods  - Diabetes  - Coronary Artery Disease  - Heart failure  - Heart attack  - Stroke  - DVT/VTE  - Cardiac arrhythmia  - Respiratory Failure/COPD  - Renal failure  - Anemia  - Advanced Liver disease   - Patient was instructed on what medications to stop prior to surgery. - Follow-up visit in 2 weeks with Dr. 76 - Begin physical therapy following surgery - Pre-operative lab work as pre-surgical testing - Prescriptions will be  provided in hospital at time of discharge  Lequita Halt, PA-C Orthopedic Surgery [facility] [place] [phone]

## 2020-12-02 ENCOUNTER — Ambulatory Visit (HOSPITAL_COMMUNITY): Payer: BC Managed Care – PPO | Admitting: Physician Assistant

## 2020-12-02 ENCOUNTER — Ambulatory Visit (HOSPITAL_COMMUNITY): Payer: BC Managed Care – PPO | Admitting: Anesthesiology

## 2020-12-02 ENCOUNTER — Encounter (HOSPITAL_COMMUNITY): Payer: Self-pay | Admitting: Orthopedic Surgery

## 2020-12-02 ENCOUNTER — Encounter (HOSPITAL_COMMUNITY)
Admission: RE | Disposition: A | Discharge status: Home / Self Care | Payer: Self-pay | Source: Home / Self Care | Attending: Orthopedic Surgery

## 2020-12-02 ENCOUNTER — Observation Stay (HOSPITAL_COMMUNITY)
Admission: RE | Admit: 2020-12-02 | Discharge: 2020-12-03 | Disposition: A | Discharge status: Home / Self Care | Payer: BC Managed Care – PPO | Attending: Orthopedic Surgery | Admitting: Orthopedic Surgery

## 2020-12-02 DIAGNOSIS — M25562 Pain in left knee: Secondary | ICD-10-CM | POA: Diagnosis not present

## 2020-12-02 DIAGNOSIS — M1710 Unilateral primary osteoarthritis, unspecified knee: Secondary | ICD-10-CM

## 2020-12-02 DIAGNOSIS — Z79899 Other long term (current) drug therapy: Secondary | ICD-10-CM | POA: Insufficient documentation

## 2020-12-02 DIAGNOSIS — M179 Osteoarthritis of knee, unspecified: Secondary | ICD-10-CM

## 2020-12-02 DIAGNOSIS — K219 Gastro-esophageal reflux disease without esophagitis: Secondary | ICD-10-CM | POA: Diagnosis not present

## 2020-12-02 DIAGNOSIS — J45909 Unspecified asthma, uncomplicated: Secondary | ICD-10-CM | POA: Diagnosis not present

## 2020-12-02 DIAGNOSIS — Z87891 Personal history of nicotine dependence: Secondary | ICD-10-CM | POA: Diagnosis not present

## 2020-12-02 DIAGNOSIS — G8918 Other acute postprocedural pain: Secondary | ICD-10-CM | POA: Diagnosis not present

## 2020-12-02 DIAGNOSIS — M1712 Unilateral primary osteoarthritis, left knee: Principal | ICD-10-CM

## 2020-12-02 HISTORY — PX: TOTAL KNEE ARTHROPLASTY: SHX125

## 2020-12-02 LAB — TYPE AND SCREEN
ABO/RH(D): O POS
Antibody Screen: NEGATIVE

## 2020-12-02 SURGERY — ARTHROPLASTY, KNEE, TOTAL
Anesthesia: Monitor Anesthesia Care | Site: Knee | Laterality: Left

## 2020-12-02 MED ORDER — ROPIVACAINE HCL 5 MG/ML IJ SOLN
INTRAMUSCULAR | Status: DC | PRN
  Administered 2020-12-02: 30 mL via PERINEURAL

## 2020-12-02 MED ORDER — PROPOFOL 10 MG/ML IV BOLUS
INTRAVENOUS | Status: AC
  Filled 2020-12-02: qty 20

## 2020-12-02 MED ORDER — SODIUM CHLORIDE (PF) 0.9 % IJ SOLN
INTRAMUSCULAR | Status: DC | PRN
  Administered 2020-12-02: 60 mL

## 2020-12-02 MED ORDER — POLYETHYLENE GLYCOL 3350 17 G PO PACK: 17.0000 g | PACK | Freq: Every day | ORAL | Status: DC | PRN

## 2020-12-02 MED ORDER — DEXAMETHASONE SODIUM PHOSPHATE 10 MG/ML IJ SOLN
INTRAMUSCULAR | Status: DC | PRN
  Administered 2020-12-02: 8 mg via INTRAVENOUS

## 2020-12-02 MED ORDER — ALBUTEROL SULFATE (2.5 MG/3ML) 0.083% IN NEBU
INHALATION_SOLUTION | RESPIRATORY_TRACT | Status: AC
  Filled 2020-12-02: qty 3

## 2020-12-02 MED ORDER — LISDEXAMFETAMINE DIMESYLATE 30 MG PO CAPS
60.0000 mg | ORAL_CAPSULE | ORAL | Status: DC
  Administered 2020-12-03: 60 mg via ORAL
  Filled 2020-12-02 (×2): qty 2

## 2020-12-02 MED ORDER — METOCLOPRAMIDE HCL 5 MG/ML IJ SOLN
5.0000 mg | Freq: Three times a day (TID) | INTRAMUSCULAR | Status: DC | PRN
Start: 2020-12-02 — End: 2020-12-03

## 2020-12-02 MED ORDER — PROPOFOL 500 MG/50ML IV EMUL
INTRAVENOUS | Status: DC | PRN
  Administered 2020-12-02: 75 ug/kg/min via INTRAVENOUS

## 2020-12-02 MED ORDER — PHENYLEPHRINE 40 MCG/ML (10ML) SYRINGE FOR IV PUSH (FOR BLOOD PRESSURE SUPPORT)
PREFILLED_SYRINGE | INTRAVENOUS | Status: AC
  Filled 2020-12-02: qty 10

## 2020-12-02 MED ORDER — MENTHOL 3 MG MT LOZG: 1.0000 | LOZENGE | OROMUCOSAL | Status: DC | PRN

## 2020-12-02 MED ORDER — PROPOFOL 10 MG/ML IV BOLUS
INTRAVENOUS | Status: DC | PRN
  Administered 2020-12-02 (×2): 20 mg via INTRAVENOUS

## 2020-12-02 MED ORDER — METOCLOPRAMIDE HCL 5 MG PO TABS
5.0000 mg | ORAL_TABLET | Freq: Three times a day (TID) | ORAL | Status: DC | PRN
Start: 2020-12-02 — End: 2020-12-03

## 2020-12-02 MED ORDER — LIDOCAINE 2% (20 MG/ML) 5 ML SYRINGE
INTRAMUSCULAR | Status: DC | PRN
  Administered 2020-12-02: 40 mg via INTRAVENOUS

## 2020-12-02 MED ORDER — LACTATED RINGERS IV SOLN: INTRAVENOUS | Status: DC

## 2020-12-02 MED ORDER — DEXAMETHASONE SODIUM PHOSPHATE 10 MG/ML IJ SOLN
10.0000 mg | Freq: Once | INTRAMUSCULAR | Status: AC
  Administered 2020-12-03: 10 mg via INTRAVENOUS
  Filled 2020-12-02: qty 1

## 2020-12-02 MED ORDER — SODIUM CHLORIDE (PF) 0.9 % IJ SOLN
INTRAMUSCULAR | Status: AC
  Filled 2020-12-02: qty 10

## 2020-12-02 MED ORDER — MONTELUKAST SODIUM 10 MG PO TABS
10.0000 mg | ORAL_TABLET | Freq: Every evening | ORAL | Status: DC
  Administered 2020-12-02: 10 mg via ORAL
  Filled 2020-12-02: qty 1

## 2020-12-02 MED ORDER — FLUTICASONE PROPIONATE 50 MCG/ACT NA SUSP
1.0000 | Freq: Every day | NASAL | Status: DC
  Administered 2020-12-03: 2 via NASAL
  Filled 2020-12-02: qty 16

## 2020-12-02 MED ORDER — FLEET ENEMA 7-19 GM/118ML RE ENEM: 1.0000 | ENEMA | Freq: Once | RECTAL | Status: DC | PRN

## 2020-12-02 MED ORDER — 0.9 % SODIUM CHLORIDE (POUR BTL) OPTIME
TOPICAL | Status: DC | PRN
  Administered 2020-12-02: 1000 mL

## 2020-12-02 MED ORDER — ONDANSETRON HCL 4 MG/2ML IJ SOLN: 4.0000 mg | Freq: Four times a day (QID) | INTRAMUSCULAR | Status: DC | PRN

## 2020-12-02 MED ORDER — FENTANYL CITRATE (PF) 100 MCG/2ML IJ SOLN
50.0000 ug | INTRAMUSCULAR | Status: DC
  Administered 2020-12-02: 50 ug via INTRAVENOUS
  Filled 2020-12-02: qty 2

## 2020-12-02 MED ORDER — SODIUM CHLORIDE 0.9 % IV SOLN: INTRAVENOUS | Status: DC

## 2020-12-02 MED ORDER — ESCITALOPRAM OXALATE 20 MG PO TABS
20.0000 mg | ORAL_TABLET | Freq: Every day | ORAL | Status: DC
  Administered 2020-12-03: 20 mg via ORAL
  Filled 2020-12-02: qty 1

## 2020-12-02 MED ORDER — BISACODYL 10 MG RE SUPP: 10.0000 mg | Freq: Every day | RECTAL | Status: DC | PRN

## 2020-12-02 MED ORDER — BUPIVACAINE IN DEXTROSE 0.75-8.25 % IT SOLN
INTRATHECAL | Status: DC | PRN
  Administered 2020-12-02: 1.6 mL via INTRATHECAL

## 2020-12-02 MED ORDER — ONDANSETRON HCL 4 MG/2ML IJ SOLN
INTRAMUSCULAR | Status: DC | PRN
  Administered 2020-12-02: 4 mg via INTRAVENOUS

## 2020-12-02 MED ORDER — MORPHINE SULFATE (PF) 2 MG/ML IV SOLN
0.5000 mg | INTRAVENOUS | Status: DC | PRN
  Administered 2020-12-02: 1 mg via INTRAVENOUS
  Filled 2020-12-02: qty 1

## 2020-12-02 MED ORDER — PHENYLEPHRINE HCL-NACL 10-0.9 MG/250ML-% IV SOLN
INTRAVENOUS | Status: DC | PRN
  Administered 2020-12-02: 25 ug/min via INTRAVENOUS

## 2020-12-02 MED ORDER — OXYCODONE HCL 5 MG PO TABS
5.0000 mg | ORAL_TABLET | ORAL | Status: DC | PRN
  Administered 2020-12-02: 5 mg via ORAL
  Filled 2020-12-02: qty 1

## 2020-12-02 MED ORDER — AZELASTINE HCL 0.1 % NA SOLN
1.0000 | Freq: Every day | NASAL | Status: DC
  Administered 2020-12-03: 1 via NASAL
  Filled 2020-12-02: qty 30

## 2020-12-02 MED ORDER — BUPIVACAINE LIPOSOME 1.3 % IJ SUSP
INTRAMUSCULAR | Status: DC | PRN
  Administered 2020-12-02: 20 mL

## 2020-12-02 MED ORDER — POVIDONE-IODINE 10 % EX SWAB
2.0000 "application " | Freq: Once | CUTANEOUS | Status: AC
  Administered 2020-12-02: 2 via TOPICAL

## 2020-12-02 MED ORDER — OXYCODONE HCL 5 MG PO TABS
10.0000 mg | ORAL_TABLET | ORAL | Status: DC | PRN
  Administered 2020-12-02 – 2020-12-03 (×6): 10 mg via ORAL
  Filled 2020-12-02 (×6): qty 2

## 2020-12-02 MED ORDER — RIVAROXABAN 10 MG PO TABS
10.0000 mg | ORAL_TABLET | Freq: Every day | ORAL | Status: DC
  Administered 2020-12-03: 10 mg via ORAL
  Filled 2020-12-02: qty 1

## 2020-12-02 MED ORDER — DIPHENHYDRAMINE HCL 12.5 MG/5ML PO ELIX: 12.5000 mg | ORAL_SOLUTION | ORAL | Status: DC | PRN

## 2020-12-02 MED ORDER — METHOCARBAMOL 500 MG PO TABS
500.0000 mg | ORAL_TABLET | Freq: Four times a day (QID) | ORAL | Status: DC | PRN
  Administered 2020-12-02 – 2020-12-03 (×3): 500 mg via ORAL
  Filled 2020-12-02 (×3): qty 1

## 2020-12-02 MED ORDER — METHOCARBAMOL 500 MG IVPB - SIMPLE MED
INTRAVENOUS | Status: AC
  Filled 2020-12-02: qty 50

## 2020-12-02 MED ORDER — PANTOPRAZOLE SODIUM 40 MG PO TBEC
40.0000 mg | DELAYED_RELEASE_TABLET | Freq: Every day | ORAL | Status: DC
  Administered 2020-12-03: 40 mg via ORAL
  Filled 2020-12-02: qty 1

## 2020-12-02 MED ORDER — OXYCODONE HCL 5 MG/5ML PO SOLN: 5.0000 mg | Freq: Once | ORAL | Status: AC | PRN

## 2020-12-02 MED ORDER — TRANEXAMIC ACID-NACL 1000-0.7 MG/100ML-% IV SOLN
1000.0000 mg | INTRAVENOUS | Status: AC
  Administered 2020-12-02: 1000 mg via INTRAVENOUS
  Filled 2020-12-02: qty 100

## 2020-12-02 MED ORDER — CHLORHEXIDINE GLUCONATE 0.12 % MT SOLN
15.0000 mL | Freq: Once | OROMUCOSAL | Status: AC
  Administered 2020-12-02: 15 mL via OROMUCOSAL

## 2020-12-02 MED ORDER — CEFAZOLIN SODIUM-DEXTROSE 2-4 GM/100ML-% IV SOLN
2.0000 g | INTRAVENOUS | Status: AC
  Administered 2020-12-02: 2 g via INTRAVENOUS
  Filled 2020-12-02: qty 100

## 2020-12-02 MED ORDER — LORATADINE 10 MG PO TABS
10.0000 mg | ORAL_TABLET | Freq: Every evening | ORAL | Status: DC
  Administered 2020-12-02: 10 mg via ORAL
  Filled 2020-12-02: qty 1

## 2020-12-02 MED ORDER — PHENYLEPHRINE 40 MCG/ML (10ML) SYRINGE FOR IV PUSH (FOR BLOOD PRESSURE SUPPORT)
PREFILLED_SYRINGE | INTRAVENOUS | Status: DC | PRN
  Administered 2020-12-02: 80 ug via INTRAVENOUS

## 2020-12-02 MED ORDER — OXYCODONE HCL 5 MG PO TABS
5.0000 mg | ORAL_TABLET | Freq: Once | ORAL | Status: AC | PRN
  Administered 2020-12-02: 5 mg via ORAL

## 2020-12-02 MED ORDER — ACETAMINOPHEN 325 MG PO TABS
325.0000 mg | ORAL_TABLET | Freq: Four times a day (QID) | ORAL | Status: DC | PRN
Start: 2020-12-03 — End: 2020-12-03
  Administered 2020-12-02 – 2020-12-03 (×2): 650 mg via ORAL
  Filled 2020-12-02 (×2): qty 2

## 2020-12-02 MED ORDER — PHENOL 1.4 % MT LIQD: 1.0000 | OROMUCOSAL | Status: DC | PRN

## 2020-12-02 MED ORDER — LEVALBUTEROL TARTRATE 45 MCG/ACT IN AERO
1.0000 | INHALATION_SPRAY | Freq: Four times a day (QID) | RESPIRATORY_TRACT | Status: DC | PRN
  Filled 2020-12-02: qty 15

## 2020-12-02 MED ORDER — GABAPENTIN 300 MG PO CAPS
300.0000 mg | ORAL_CAPSULE | Freq: Three times a day (TID) | ORAL | Status: DC
  Administered 2020-12-02 – 2020-12-03 (×4): 300 mg via ORAL
  Filled 2020-12-02 (×4): qty 1

## 2020-12-02 MED ORDER — LIDOCAINE 2% (20 MG/ML) 5 ML SYRINGE
INTRAMUSCULAR | Status: AC
  Filled 2020-12-02: qty 10

## 2020-12-02 MED ORDER — OXYCODONE HCL 5 MG PO TABS
ORAL_TABLET | ORAL | Status: AC
  Filled 2020-12-02: qty 1

## 2020-12-02 MED ORDER — CEFAZOLIN SODIUM-DEXTROSE 2-4 GM/100ML-% IV SOLN
2.0000 g | Freq: Four times a day (QID) | INTRAVENOUS | Status: AC
  Administered 2020-12-02 (×2): 2 g via INTRAVENOUS
  Filled 2020-12-02 (×2): qty 100

## 2020-12-02 MED ORDER — ONDANSETRON HCL 4 MG PO TABS: 4.0000 mg | ORAL_TABLET | Freq: Four times a day (QID) | ORAL | Status: DC | PRN

## 2020-12-02 MED ORDER — ZOLPIDEM TARTRATE 5 MG PO TABS: 5.0000 mg | ORAL_TABLET | Freq: Every evening | ORAL | Status: DC | PRN

## 2020-12-02 MED ORDER — SODIUM CHLORIDE 0.9 % IR SOLN
Status: DC | PRN
  Administered 2020-12-02: 1000 mL

## 2020-12-02 MED ORDER — HYDROMORPHONE HCL 1 MG/ML IJ SOLN
0.5000 mg | INTRAMUSCULAR | Status: DC | PRN
  Administered 2020-12-02 (×3): 1 mg via INTRAVENOUS
  Filled 2020-12-02 (×3): qty 1

## 2020-12-02 MED ORDER — FLUTICASONE FUROATE-VILANTEROL 200-25 MCG/INH IN AEPB
1.0000 | INHALATION_SPRAY | Freq: Every day | RESPIRATORY_TRACT | Status: DC
  Administered 2020-12-02: 1 via RESPIRATORY_TRACT
  Filled 2020-12-02: qty 28

## 2020-12-02 MED ORDER — ACETAMINOPHEN 10 MG/ML IV SOLN
1000.0000 mg | Freq: Four times a day (QID) | INTRAVENOUS | Status: DC
  Administered 2020-12-02: 1000 mg via INTRAVENOUS
  Filled 2020-12-02: qty 100

## 2020-12-02 MED ORDER — DOCUSATE SODIUM 100 MG PO CAPS
100.0000 mg | ORAL_CAPSULE | Freq: Two times a day (BID) | ORAL | Status: DC
  Administered 2020-12-02 – 2020-12-03 (×2): 100 mg via ORAL
  Filled 2020-12-02 (×2): qty 1

## 2020-12-02 MED ORDER — STERILE WATER FOR IRRIGATION IR SOLN
Status: DC | PRN
  Administered 2020-12-02: 2000 mL

## 2020-12-02 MED ORDER — HYDROMORPHONE HCL 1 MG/ML IJ SOLN: 0.2500 mg | INTRAMUSCULAR | Status: DC | PRN

## 2020-12-02 MED ORDER — ORAL CARE MOUTH RINSE: 15.0000 mL | Freq: Once | OROMUCOSAL | Status: AC

## 2020-12-02 MED ORDER — MIDAZOLAM HCL 2 MG/2ML IJ SOLN
1.0000 mg | INTRAMUSCULAR | Status: DC
  Administered 2020-12-02: 2 mg via INTRAVENOUS
  Filled 2020-12-02: qty 2

## 2020-12-02 MED ORDER — DEXAMETHASONE SODIUM PHOSPHATE 10 MG/ML IJ SOLN: 8.0000 mg | Freq: Once | INTRAMUSCULAR | Status: DC

## 2020-12-02 MED ORDER — METHOCARBAMOL 500 MG IVPB - SIMPLE MED
500.0000 mg | Freq: Four times a day (QID) | INTRAVENOUS | Status: DC | PRN
  Administered 2020-12-02: 500 mg via INTRAVENOUS
  Filled 2020-12-02: qty 50

## 2020-12-02 MED ORDER — LEVOCETIRIZINE DIHYDROCHLORIDE 5 MG PO TABS: 2.5000 mg | ORAL_TABLET | Freq: Every evening | ORAL | Status: DC

## 2020-12-02 MED ORDER — PROMETHAZINE HCL 25 MG/ML IJ SOLN: 6.2500 mg | INTRAMUSCULAR | Status: DC | PRN

## 2020-12-02 MED ORDER — DEXAMETHASONE SODIUM PHOSPHATE 10 MG/ML IJ SOLN
INTRAMUSCULAR | Status: DC | PRN
  Administered 2020-12-02: 10 mg

## 2020-12-02 SURGICAL SUPPLY — 53 items
ATTUNE MED DOME PAT 38 KNEE (Knees) ×2 IMPLANT
ATTUNE PS FEM LT SZ 6 CEM KNEE (Femur) ×2 IMPLANT
ATTUNE PSRP INSR SZ6 8 KNEE (Insert) ×2 IMPLANT
BAG ZIPLOCK 12X15 (MISCELLANEOUS) ×2 IMPLANT
BASE TIBIA ATTUNE KNEE SYS SZ6 (Knees) ×1 IMPLANT
BLADE SAG 18X100X1.27 (BLADE) ×2 IMPLANT
BLADE SAW SGTL 11.0X1.19X90.0M (BLADE) ×2 IMPLANT
BNDG ELASTIC 6X5.8 VLCR STR LF (GAUZE/BANDAGES/DRESSINGS) ×2 IMPLANT
BOWL SMART MIX CTS (DISPOSABLE) ×2 IMPLANT
CEMENT HV SMART SET (Cement) ×4 IMPLANT
CLSR STERI-STRIP ANTIMIC 1/2X4 (GAUZE/BANDAGES/DRESSINGS) ×2 IMPLANT
COVER SURGICAL LIGHT HANDLE (MISCELLANEOUS) ×2 IMPLANT
COVER WAND RF STERILE (DRAPES) IMPLANT
CUFF TOURN SGL QUICK 34 (TOURNIQUET CUFF) ×2
CUFF TRNQT CYL 34X4.125X (TOURNIQUET CUFF) ×1 IMPLANT
DECANTER SPIKE VIAL GLASS SM (MISCELLANEOUS) ×2 IMPLANT
DRAPE U-SHAPE 47X51 STRL (DRAPES) ×2 IMPLANT
DRSG AQUACEL AG ADV 3.5X10 (GAUZE/BANDAGES/DRESSINGS) ×2 IMPLANT
DURAPREP 26ML APPLICATOR (WOUND CARE) ×2 IMPLANT
ELECT REM PT RETURN 15FT ADLT (MISCELLANEOUS) ×2 IMPLANT
GLOVE SRG 8 PF TXTR STRL LF DI (GLOVE) ×1 IMPLANT
GLOVE SURG ENC MOIS LTX SZ6 (GLOVE) IMPLANT
GLOVE SURG ENC MOIS LTX SZ7 (GLOVE) ×2 IMPLANT
GLOVE SURG ENC MOIS LTX SZ8 (GLOVE) ×2 IMPLANT
GLOVE SURG UNDER POLY LF SZ6.5 (GLOVE) ×2 IMPLANT
GLOVE SURG UNDER POLY LF SZ8 (GLOVE) ×2
GLOVE SURG UNDER POLY LF SZ8.5 (GLOVE) ×2 IMPLANT
GOWN STRL REUS W/TWL LRG LVL3 (GOWN DISPOSABLE) ×4 IMPLANT
GOWN STRL REUS W/TWL XL LVL3 (GOWN DISPOSABLE) ×2 IMPLANT
HANDPIECE INTERPULSE COAX TIP (DISPOSABLE) ×2
HOLDER FOLEY CATH W/STRAP (MISCELLANEOUS) IMPLANT
IMMOBILIZER KNEE 20 (SOFTGOODS) ×2
IMMOBILIZER KNEE 20 THIGH 36 (SOFTGOODS) ×1 IMPLANT
KIT TURNOVER KIT A (KITS) ×2 IMPLANT
MANIFOLD NEPTUNE II (INSTRUMENTS) ×2 IMPLANT
NS IRRIG 1000ML POUR BTL (IV SOLUTION) ×2 IMPLANT
PACK TOTAL KNEE CUSTOM (KITS) ×2 IMPLANT
PADDING CAST COTTON 6X4 STRL (CAST SUPPLIES) ×2 IMPLANT
PENCIL SMOKE EVACUATOR (MISCELLANEOUS) ×2 IMPLANT
PIN DRILL FIX HALF THREAD (BIT) ×2 IMPLANT
PIN STEINMAN FIXATION KNEE (PIN) ×2 IMPLANT
PROTECTOR NERVE ULNAR (MISCELLANEOUS) ×2 IMPLANT
SET HNDPC FAN SPRY TIP SCT (DISPOSABLE) ×1 IMPLANT
STRIP CLOSURE SKIN 1/2X4 (GAUZE/BANDAGES/DRESSINGS) ×4 IMPLANT
SUT MNCRL AB 4-0 PS2 18 (SUTURE) ×2 IMPLANT
SUT STRATAFIX 0 PDS 27 VIOLET (SUTURE) ×2
SUT VIC AB 2-0 CT1 27 (SUTURE) ×6
SUT VIC AB 2-0 CT1 TAPERPNT 27 (SUTURE) ×3 IMPLANT
SUTURE STRATFX 0 PDS 27 VIOLET (SUTURE) ×1 IMPLANT
TIBIA ATTUNE KNEE SYS BASE SZ6 (Knees) ×2 IMPLANT
TRAY FOLEY MTR SLVR 16FR STAT (SET/KITS/TRAYS/PACK) ×2 IMPLANT
WATER STERILE IRR 1000ML POUR (IV SOLUTION) ×4 IMPLANT
WRAP KNEE MAXI GEL POST OP (GAUZE/BANDAGES/DRESSINGS) ×2 IMPLANT

## 2020-12-02 NOTE — Interval H&P Note (Signed)
History and Physical Interval Note:  12/02/2020 8:19 AM  Terri [name]  has presented today for surgery, with the diagnosis of Left knee osteoarthritis.  The various methods of treatment have been discussed with the patient and family. After consideration of risks, benefits and other options for treatment, the patient has consented to  Procedure(s) with comments: TOTAL KNEE ARTHROPLASTY (Left) - as a surgical intervention.  The patient's history has been reviewed, patient examined, no change in status, stable for surgery.  I have reviewed the patient's chart and labs.  Questions were answered to the patient's satisfaction.     Terri [name]

## 2020-12-02 NOTE — Progress Notes (Signed)
Assisted Dr. Beth Finucane with left, ultrasound guided, adductor canal block. Side rails up, monitors on throughout procedure. See vital signs in flow sheet. Tolerated Procedure well. ° °

## 2020-12-02 NOTE — Progress Notes (Signed)
Orthopedic Tech Progress Note Patient Details:  Terri [name] 28-Apr-1956 [id number] Patient already has knee immobilizer. Patient ID: Terri [name], female   DOB: 10/22/1955, 65 y.o.   MRN: [id number]   [name] 12/02/2020, 12:17 PM

## 2020-12-02 NOTE — Transfer of Care (Signed)
Immediate Anesthesia Transfer of Care Note  Patient: Terri [name]  Procedure(s) Performed: Procedure(s) with comments: TOTAL KNEE ARTHROPLASTY (Left) -  Patient Location: PACU  Anesthesia Type:Spinal  Level of Consciousness:  sedated, patient cooperative and responds to stimulation  Airway & Oxygen Therapy:Patient Spontanous Breathing and Patient connected to face mask oxgen  Post-op Assessment:  Report given to PACU RN and Post -op Vital signs reviewed and stable  Post vital signs:  Reviewed and stable  Last Vitals:  Vitals:   12/02/20 0937 12/02/20 1202  BP:    Pulse: 70 (!) 58  Resp: 13   Temp:  36.7 C  SpO2: 93%     Complications: No apparent anesthesia complications

## 2020-12-02 NOTE — Progress Notes (Signed)
Orthopedic Tech Progress Note Patient Details:  Terri [name] [birth year] [id number]  CPM Left Knee CPM Left Knee: On Left Knee Flexion (Degrees): 40 Left Knee Extension (Degrees): 10  Post Interventions Patient Tolerated: Well Instructions Provided: Care of device     Post Interventions Patient Tolerated: Well Instructions Provided: Care of device   [name] 12/02/2020, 12:16 PM

## 2020-12-02 NOTE — Discharge Instructions (Addendum)
 Frank Aluisio, MD Total Joint Specialist [facility] [place] [address] [place], [place] [address] [phone]  TOTAL KNEE REPLACEMENT POSTOPERATIVE DIRECTIONS    Knee Rehabilitation, Guidelines Following Surgery  Results after knee surgery are often greatly improved when you follow the exercise, range of motion and muscle strengthening exercises prescribed by your doctor. Safety measures are also important to protect the knee from further injury. If any of these exercises cause you to have increased pain or swelling in your knee joint, decrease the amount until you are comfortable again and slowly increase them. If you have problems or questions, call your caregiver or physical therapist for advice.   BLOOD CLOT PREVENTION . Take a 10 mg Xarelto once a day for three weeks following surgery. Then take an 81 mg Aspirin once a day for three weeks. Then discontinue Aspirin. . You may resume your vitamins/supplements once you have discontinued the Xarelto. . Do not take any NSAIDs (Advil, Aleve, Ibuprofen, Meloxicam, etc.) until you have discontinued the Xarelto.   HOME CARE INSTRUCTIONS  . Remove items at home which could result in a fall. This includes throw rugs or furniture in walking pathways.  . ICE to the affected knee as much as tolerated. Icing helps control swelling. If the swelling is well controlled you will be more comfortable and rehab easier. Continue to use ice on the knee for pain and swelling from surgery. You may notice swelling that will progress down to the foot and ankle. This is normal after surgery. Elevate the leg when you are not up walking on it.    . Continue to use the breathing machine which will help keep your temperature down. It is common for your temperature to cycle up and down following surgery, especially at night when you are not up moving around and exerting yourself. The breathing machine keeps your lungs expanded and your  temperature down. . Do not place pillow under the operative knee, focus on keeping the knee straight while resting  DIET You may resume your previous home diet once you are discharged from the hospital.  DRESSING / WOUND CARE / SHOWERING . Keep your bulky bandage on for 2 days. On the third post-operative day you may remove the Ace bandage and gauze. There is a waterproof adhesive bandage on your skin which will stay in place until your first follow-up appointment. Once you remove this you will not need to place another bandage . You may begin showering 3 days following surgery, but do not submerge the incision under water.  ACTIVITY For the first 5 days, the key is rest and control of pain and swelling . Do your home exercises twice a day starting on post-operative day 3. On the days you go to physical therapy, just do the home exercises once that day. . You should rest, ice and elevate the leg for 50 minutes out of every hour. Get up and walk/stretch for 10 minutes per hour. After 5 days you can increase your activity slowly as tolerated. . Walk with your walker as instructed. Use the walker until you are comfortable transitioning to a cane. Walk with the cane in the opposite hand of the operative leg. You may discontinue the cane once you are comfortable and walking steadily. . Avoid periods of inactivity such as sitting longer than an hour when not asleep. This helps prevent blood clots.  . You may discontinue the knee immobilizer once you are able to perform a straight leg   raise while lying down. . You may resume a sexual relationship in one month or when given the OK by your doctor.  . You may return to work once you are cleared by your doctor.  . Do not drive a car for 6 weeks or until released by your surgeon.  . Do not drive while taking narcotics.  TED HOSE STOCKINGS Wear the elastic stockings on both legs for three weeks following surgery during the day. You may remove them at night  for sleeping.  WEIGHT BEARING Weight bearing as tolerated with assist device (walker, cane, etc) as directed, use it as long as suggested by your surgeon or therapist, typically at least 4-6 weeks.  POSTOPERATIVE CONSTIPATION PROTOCOL Constipation - defined medically as fewer than three stools per week and severe constipation as less than one stool per week.  One of the most common issues patients have following surgery is constipation.  Even if you have a regular bowel pattern at home, your normal regimen is likely to be disrupted due to multiple reasons following surgery.  Combination of anesthesia, postoperative narcotics, change in appetite and fluid intake all can affect your bowels.  In order to avoid complications following surgery, here are some recommendations in order to help you during your recovery period.  . Colace (docusate) - Pick up an over-the-counter form of Colace or another stool softener and take twice a day as long as you are requiring postoperative pain medications.  Take with a full glass of water daily.  If you experience loose stools or diarrhea, hold the colace until you stool forms back up. If your symptoms do not get better within 1 week or if they get worse, check with your doctor. . Dulcolax (bisacodyl) - Pick up over-the-counter and take as directed by the product packaging as needed to assist with the movement of your bowels.  Take with a full glass of water.  Use this product as needed if not relieved by Colace only.  . MiraLax (polyethylene glycol) - Pick up over-the-counter to have on hand. MiraLax is a solution that will increase the amount of water in your bowels to assist with bowel movements.  Take as directed and can mix with a glass of water, juice, soda, coffee, or tea. Take if you go more than two days without a movement. Do not use MiraLax more than once per day. Call your doctor if you are still constipated or irregular after using this medication for 7 days  in a row.  If you continue to have problems with postoperative constipation, please contact the office for further assistance and recommendations.  If you experience "the worst abdominal pain ever" or develop nausea or vomiting, please contact the office immediatly for further recommendations for treatment.  ITCHING If you experience itching with your medications, try taking only a single pain pill, or even half a pain pill at a time.  You can also use Benadryl over the counter for itching or also to help with sleep.   MEDICATIONS See your medication summary on the "After Visit Summary" that the nursing staff will review with you prior to discharge.  You may have some home medications which will be placed on hold until you complete the course of blood thinner medication.  It is important for you to complete the blood thinner medication as prescribed by your surgeon.  Continue your approved medications as instructed at time of discharge.  PRECAUTIONS . If you experience chest pain or shortness of breath -   call 911 immediately for transfer to the hospital emergency department.  . If you develop a fever greater that 101 F, purulent drainage from wound, increased redness or drainage from wound, foul odor from the wound/dressing, or calf pain - CONTACT YOUR SURGEON.                                                   FOLLOW-UP APPOINTMENTS Make sure you keep all of your appointments after your operation with your surgeon and caregivers. You should call the office at the above phone number and make an appointment for approximately two weeks after the date of your surgery or on the date instructed by your surgeon outlined in the "After Visit Summary".  RANGE OF MOTION AND STRENGTHENING EXERCISES  Rehabilitation of the knee is important following a knee injury or an operation. After just a few days of immobilization, the muscles of the thigh which control the knee become weakened and shrink (atrophy). Knee  exercises are designed to build up the tone and strength of the thigh muscles and to improve knee motion. Often times heat used for twenty to thirty minutes before working out will loosen up your tissues and help with improving the range of motion but do not use heat for the first two weeks following surgery. These exercises can be done on a training (exercise) mat, on the floor, on a table or on a bed. Use what ever works the best and is most comfortable for you Knee exercises include:  . Leg Lifts - While your knee is still immobilized in a splint or cast, you can do straight leg raises. Lift the leg to 60 degrees, hold for 3 sec, and slowly lower the leg. Repeat 10-20 times 2-3 times daily. Perform this exercise against resistance later as your knee gets better.  . Quad and Hamstring Sets - Tighten up the muscle on the front of the thigh (Quad) and hold for 5-10 sec. Repeat this 10-20 times hourly. Hamstring sets are done by pushing the foot backward against an object and holding for 5-10 sec. Repeat as with quad sets.   Leg Slides: Lying on your back, slowly slide your foot toward your buttocks, bending your knee up off the floor (only go as far as is comfortable). Then slowly slide your foot back down until your leg is flat on the floor again.  Angel Wings: Lying on your back spread your legs to the side as far apart as you can without causing discomfort.  A rehabilitation program following serious knee injuries can speed recovery and prevent re-injury in the future due to weakened muscles. Contact your doctor or a physical therapist for more information on knee rehabilitation.   IF YOU ARE TRANSFERRED TO A SKILLED REHAB FACILITY If the patient is transferred to a skilled rehab facility following release from the hospital, a list of the current medications will be sent to the facility for the patient to continue.  When discharged from the skilled rehab facility, please have the facility set up the  patient's Home Health Physical Therapy prior to being released. Also, the skilled facility will be responsible for providing the patient with their medications at time of release from the facility to include their pain medication, the muscle relaxants, and their blood thinner medication. If the patient is still at the rehab facility   at time of the two week follow up appointment, the skilled rehab facility will also need to assist the patient in arranging follow up appointment in our office and any transportation needs.  MAKE SURE YOU:  . Understand these instructions.  . Get help right away if you are not doing well or get worse.   DENTAL ANTIBIOTICS:  In most cases prophylactic antibiotics for Dental procdeures after total joint surgery are not necessary.  Exceptions are as follows:  1. History of prior total joint infection  2. Severely immunocompromised (Organ Transplant, cancer chemotherapy, Rheumatoid biologic meds such as Humera)  3. Poorly controlled diabetes (A1C &gt; 8.0, blood glucose over 200)  If you have one of these conditions, contact your surgeon for an antibiotic prescription, prior to your dental procedure.    Pick up stool softner and laxative for home use following surgery while on pain medications. Do not submerge incision under water. Please use good hand washing techniques while changing dressing each day. May shower starting three days after surgery. Please use a clean towel to pat the incision dry following showers. Continue to use ice for pain and swelling after surgery. Do not use any lotions or creams on the incision until instructed by your surgeon.    Information on my medicine - XARELTO (Rivaroxaban)   Why was Xarelto prescribed for you? Xarelto was prescribed for you to reduce the risk of blood clots forming after orthopedic surgery. The medical term for these abnormal blood clots is venous thromboembolism (VTE).  What do you need to know  about xarelto ? Take your Xarelto ONCE DAILY at the same time every day. You may take it either with or without food.  If you have difficulty swallowing the tablet whole, you may crush it and mix in applesauce just prior to taking your dose.  Take Xarelto exactly as prescribed by your doctor and DO NOT stop taking Xarelto without talking to the doctor who prescribed the medication.  Stopping without other VTE prevention medication to take the place of Xarelto may increase your risk of developing a clot.  After discharge, you should have regular check-up appointments with your healthcare provider that is prescribing your Xarelto.    What do you do if you miss a dose? If you miss a dose, take it as soon as you remember on the same day then continue your regularly scheduled once daily regimen the next day. Do not take two doses of Xarelto on the same day.   Important Safety Information A possible side effect of Xarelto is bleeding. You should call your healthcare provider right away if you experience any of the following: ? Bleeding from an injury or your nose that does not stop. ? Unusual colored urine (red or dark brown) or unusual colored stools (red or black). ? Unusual bruising for unknown reasons. ? A serious fall or if you hit your head (even if there is no bleeding).  Some medicines may interact with Xarelto and might increase your risk of bleeding while on Xarelto. To help avoid this, consult your healthcare provider or pharmacist prior to using any new prescription or non-prescription medications, including herbals, vitamins, non-steroidal anti-inflammatory drugs (NSAIDs) and supplements.  This website has more information on Xarelto: www.xarelto.com.   

## 2020-12-02 NOTE — Anesthesia Procedure Notes (Signed)
Anesthesia Regional Block: Adductor canal block   Pre-Anesthetic Checklist: ,, timeout performed, Correct Patient, Correct Site, Correct Laterality, Correct Procedure, Correct Position, site marked, Risks and benefits discussed,  Surgical consent,  Pre-op evaluation,  At surgeon's request and post-op pain management  Laterality: Left  Prep: Maximum Sterile Barrier Precautions used, chloraprep       Needles:  Injection technique: Single-shot  Needle Type: Echogenic Stimulator Needle     Needle Length: 9cm  Needle Gauge: 22     Additional Needles:   Procedures:,,,, ultrasound used (permanent image in chart),,,,  Narrative:  Start time: 12/02/2020 9:30 AM End time: 12/02/2020 9:35 AM Injection made incrementally with aspirations every 5 mL.  Performed by: Personally  Anesthesiologist: Lannie Fields, DO  Additional Notes: Monitors applied. No increased pain on injection. No increased resistance to injection. Injection made in 5cc increments. Good needle visualization. Patient tolerated procedure well.

## 2020-12-02 NOTE — Op Note (Signed)
OPERATIVE REPORT-TOTAL KNEE ARTHROPLASTY   Pre-operative diagnosis- Osteoarthritis  Left knee(s)  Post-operative diagnosis- Osteoarthritis Left knee(s)  Procedure-  Left  Total Knee Arthroplasty  Surgeon- Gus Rankin. Julias Mould, MD  Assistant- Nelia Shi, PA-C   Anesthesia-  Adductor canal block and spinal  EBL- 25 ml   Drains None  Tourniquet time-  Total Tourniquet Time Documented: Thigh (Left) - 35 minutes Total: Thigh (Left) - 35 minutes     Complications- None  Condition-PACU - hemodynamically stable.   Brief Clinical Note  Terri [name] is a 65 y.o. year old female with end stage OA of her left knee with progressively worsening pain and dysfunction. She has constant pain, with activity and at rest and significant functional deficits with difficulties even with ADLs. She has had extensive non-op management including analgesics, injections of cortisone and viscosupplements, and home exercise program, but remains in significant pain with significant dysfunction. Radiographs show bone on bone arthritis medial and patellofemoral. She presents now for left Total Knee Arthroplasty.    Procedure in detail---   The patient is brought into the operating room and positioned supine on the operating table. After successful administration of  Adductor canal block and spinal,   a tourniquet is placed high on the  Left thigh(s) and the lower extremity is prepped and draped in the usual sterile fashion. Time out is performed by the operating team and then the  Left lower extremity is wrapped in Esmarch, knee flexed and the tourniquet inflated to 300 mmHg.       A midline incision is made with a ten blade through the subcutaneous tissue to the level of the extensor mechanism. A fresh blade is used to make a medial parapatellar arthrotomy. Soft tissue over the proximal medial tibia is subperiosteally elevated to the joint line with a knife and into the semimembranosus bursa with a Cobb elevator.  Soft tissue over the proximal lateral tibia is elevated with attention being paid to avoiding the patellar tendon on the tibial tubercle. The patella is everted, knee flexed 90 degrees and the ACL and PCL are removed. Findings are bone on bone medial and patellofemoral with large global osteophytes.        The drill is used to create a starting hole in the distal femur and the canal is thoroughly irrigated with sterile saline to remove the fatty contents. The 5 degree Left  valgus alignment guide is placed into the femoral canal and the distal femoral cutting block is pinned to remove 9 mm off the distal femur. Resection is made with an oscillating saw.      The tibia is subluxed forward and the menisci are removed. The extramedullary alignment guide is placed referencing proximally at the medial aspect of the tibial tubercle and distally along the second metatarsal axis and tibial crest. The block is pinned to remove 7mm off the more deficient medial  side. Resection is made with an oscillating saw. Size 6is the most appropriate size for the tibia and the proximal tibia is prepared with the modular drill and keel punch for that size.      The femoral sizing guide is placed and size 6 is most appropriate. Rotation is marked off the epicondylar axis and confirmed by creating a rectangular flexion gap at 90 degrees. The size 6 cutting block is pinned in this rotation and the anterior, posterior and chamfer cuts are made with the oscillating saw. The intercondylar block is then placed and that cut is made.  Trial size 6 tibial component, trial size 6 posterior stabilized femur and a 8  mm posterior stabilized rotating platform insert trial is placed. Full extension is achieved with excellent varus/valgus and anterior/posterior balance throughout full range of motion. The patella is everted and thickness measured to be 22  mm. Free hand resection is taken to 12 mm, a 38 template is placed, lug holes are drilled,  trial patella is placed, and it tracks normally. Osteophytes are removed off the posterior femur with the trial in place. All trials are removed and the cut bone surfaces prepared with pulsatile lavage. Cement is mixed and once ready for implantation, the size 6 tibial implant, size  6 posterior stabilized femoral component, and the size 38 patella are cemented in place and the patella is held with the clamp. The trial insert is placed and the knee held in full extension. The Exparel (20 ml mixed with 60 ml saline) is injected into the extensor mechanism, posterior capsule, medial and lateral gutters and subcutaneous tissues.  All extruded cement is removed and once the cement is hard the permanent 8 mm posterior stabilized rotating platform insert is placed into the tibial tray.      The wound is copiously irrigated with saline solution and the extensor mechanism closed with # 0 Stratofix suture. The tourniquet is released for a total tourniquet time of 35  minutes. Flexion against gravity is 130 degrees and the patella tracks normally. Subcutaneous tissue is closed with 2.0 vicryl and subcuticular with running 4.0 Monocryl. The incision is cleaned and dried and steri-strips and a bulky sterile dressing are applied. The limb is placed into a knee immobilizer and the patient is awakened and transported to recovery in stable condition.      Please note that a surgical assistant was a medical necessity for this procedure in order to perform it in a safe and expeditious manner. Surgical assistant was necessary to retract the ligaments and vital neurovascular structures to prevent injury to them and also necessary for proper positioning of the limb to allow for anatomic placement of the prosthesis.   Gus Rankin Dalary Hollar, MD    12/02/2020, 11:23 AM

## 2020-12-02 NOTE — Anesthesia Preprocedure Evaluation (Addendum)
Anesthesia Evaluation  Patient identified by MRN, date of birth, ID band Patient awake    Reviewed: Allergy & Precautions, NPO status , Patient's Chart, lab work & pertinent test results  History of Anesthesia Complications (+) PROLONGED EMERGENCE and history of anesthetic complications  Airway Mallampati: III  TM Distance: >3 FB Neck ROM: Full    Dental  (+) Chipped, Dental Advisory Given,    Pulmonary asthma (rarely needs rescue inhaler ) , sleep apnea and Continuous Positive Airway Pressure Ventilation , former smoker,  Quit smoking 1995   Pulmonary exam normal breath sounds clear to auscultation       Cardiovascular negative cardio ROS Normal cardiovascular exam Rhythm:Regular Rate:Normal     Neuro/Psych negative neurological ROS  negative psych ROS   GI/Hepatic Neg liver ROS, GERD  Medicated and Controlled,S/p gastric banding 2005- band has since been let down to help with reflux. Reflux has since resolved    Endo/Other  Morbid obesityBMI 39  Renal/GU negative Renal ROS  negative genitourinary   Musculoskeletal  (+) Arthritis , Osteoarthritis,  L knee OA   Abdominal (+) + obese,   Peds  Hematology negative hematology ROS (+) hct 41.3, plt 244   Anesthesia Other Findings   Reproductive/Obstetrics negative OB ROS                            Anesthesia Physical Anesthesia Plan  ASA: III  Anesthesia Plan: Spinal, Regional and MAC   Post-op Pain Management:  Regional for Post-op pain   Induction:   PONV Risk Score and Plan: 2 and Propofol infusion and TIVA  Airway Management Planned: Natural Airway and Nasal Cannula  Additional Equipment: None  Intra-op Plan:   Post-operative Plan:   Informed Consent: I have reviewed the patients History and Physical, chart, labs and discussed the procedure including the risks, benefits and alternatives for the proposed anesthesia with the  patient or authorized representative who has indicated his/her understanding and acceptance.       Plan Discussed with: CRNA  Anesthesia Plan Comments:        Anesthesia Quick Evaluation

## 2020-12-02 NOTE — Anesthesia Postprocedure Evaluation (Signed)
Anesthesia Post Note  Patient: Terri [name]  Procedure(s) Performed: TOTAL KNEE ARTHROPLASTY (Left Knee)     Patient location during evaluation: PACU Anesthesia Type: Regional, MAC and Spinal Level of consciousness: oriented and awake and alert Pain management: pain level controlled Vital Signs Assessment: post-procedure vital signs reviewed and stable Respiratory status: spontaneous breathing and respiratory function stable Cardiovascular status: blood pressure returned to baseline and stable Postop Assessment: no headache, no backache, no apparent nausea or vomiting and spinal receding Anesthetic complications: no   No complications documented.  Last Vitals:  Vitals:   12/02/20 1245 12/02/20 1300  BP: 121/68 115/74  Pulse: (!) 58 64  Resp: 13 12  Temp:  36.8 C  SpO2: 100% 98%    Last Pain:  Vitals:   12/02/20 1300  TempSrc:   PainSc: [place]

## 2020-12-02 NOTE — Evaluation (Addendum)
Physical Therapy Evaluation Patient Details Name: Terri [name] MRN: [id number] DOB: 08/27/1956 Today's Date: 12/02/2020   History of Present Illness  Terri [name] is a 65 y.o. female with past medical history significant for arthritis and obestiy. Pt is s/p L TKA 12/02/20.  Clinical Impression  Pt is s/p L TKA resulting in the deficits listed below (see PT Problem List). PTA, pt independent with community ambulation, ADLs, driving, denies falls. Pt currently requiring min A for transfers and limited sidesteps, as well as knee immobilizer due to quad weakness. Pt becomes tearful while up in chair, assisted back to supine in bed - RN notified of high pain. Pt with good family/friend support at home ready to assist her 24/7 while recovering, but has 6-7 steps with handrails to enter home, will attempt stair training when appropriate. Pt will benefit from skilled PT to increase their independence and safety with mobility to allow discharge to the venue listed below.      Follow Up Recommendations Follow surgeon's recommendation for DC plan and follow-up therapies    Equipment Recommendations  3in1 (PT)    Recommendations for Other Services       Precautions / Restrictions Precautions Precautions: Fall;Knee Required Braces or Orthoses: Knee Immobilizer - Left Restrictions Weight Bearing Restrictions: No      Mobility  Bed Mobility Overal bed mobility: Needs Assistance Bed Mobility: Supine to Sit;Sit to Supine  Supine to sit: Min guard Sit to supine: Min assist   General bed mobility comments: UE assisting to slide LLE over to EOB to come to sitting, min A to lift LLE back into bed when return to supine    Transfers Overall transfer level: Needs assistance Equipment used: Rolling walker (2 wheeled) Transfers: Sit to/from Stand Sit to Stand: Min assist;From elevated surface  General transfer comment: min A from elevated bed, cues for hand placement, hip extension noted last, LLE slightly  extended  Ambulation/Gait Ambulation/Gait assistance: Min assist   Assistive device: Rolling walker (2 wheeled) Gait Pattern/deviations: Step-to pattern Gait velocity: decreased   General Gait Details: pt able to take 4-5 short, step-to steps with L foot clearing and R foot sliding on floor, knee immobilizer on without knee buckling noted, heavy UE weight-bearing on RW to reduce L weight-shift  Stairs            Wheelchair Mobility    Modified Rankin (Stroke Patients Only)       Balance Overall balance assessment: Needs assistance Sitting-balance support: Feet supported Sitting balance-Leahy Scale: Good Sitting balance - Comments: seated EOB   Standing balance support: During functional activity;Bilateral upper extremity supported Standing balance-Leahy Scale: Poor Standing balance comment: reliant on UE support          Pertinent Vitals/Pain Pain Assessment: 0-10 Pain Score: 10-Worst pain ever Pain Descriptors / Indicators: Aching;Grimacing;Crying;Sore Pain Intervention(s): Limited activity within patient's tolerance;Monitored during session;Premedicated before session;Repositioned;Ice applied    Home Living Family/patient expects to be discharged to:: Private residence Living Arrangements: Spouse/significant other Available Help at Discharge: Family;Friend(s);Available 24 hours/day Type of Home: House Home Access: Stairs to enter Entrance Stairs-Rails: Right;Left;Can reach both Entrance Stairs-Number of Steps: 6-7 Home Layout: One level Home Equipment: Walker - 2 wheels      Prior Function Level of Independence: Independent         Comments: Pt reports being independent with community ambulation, ADLs, drives, denies falls.     Hand Dominance        Extremity/Trunk Assessment   Upper Extremity Assessment Upper  Extremity Assessment: Overall WFL for tasks assessed    Lower Extremity Assessment Lower Extremity Assessment: LLE  deficits/detail LLE Deficits / Details: knee quad 1/5, unable to complete SLR; hip and ankle AROM WNL strength +3/5 LLE Sensation: WNL    Cervical / Trunk Assessment Cervical / Trunk Assessment: Normal  Communication   Communication: No difficulties  Cognition Arousal/Alertness: Awake/alert Behavior During Therapy: WFL for tasks assessed/performed Overall Cognitive Status: Within Functional Limits for tasks assessed                General Comments      Exercises Total Joint Exercises Ankle Circles/Pumps: Supine;AROM;Both;10 reps Quad Sets: Supine;AROM;Strengthening;Left;5 reps Heel Slides: Supine;AAROM;Left;5 reps   Assessment/Plan    PT Assessment Patient needs continued PT services  PT Problem List Decreased strength;Decreased range of motion;Decreased activity tolerance;Decreased balance;Decreased mobility;Decreased coordination;Decreased knowledge of use of DME;Decreased safety awareness;Impaired sensation;Obesity;Pain       PT Treatment Interventions DME instruction;Gait training;Stair training;Functional mobility training;Therapeutic activities;Therapeutic exercise;Balance training;Neuromuscular re-education;Patient/family education    PT Goals (Current goals can be found in the Care Plan section)  Acute Rehab PT Goals Patient Stated Goal: less pain PT Goal Formulation: With patient Time For Goal Achievement: 12/16/20 Potential to Achieve Goals: Good    Frequency 7X/week   Barriers to discharge        Co-evaluation               AM-PAC PT "6 Clicks" Mobility  Outcome Measure Help needed turning from your back to your side while in a flat bed without using bedrails?: A Little Help needed moving from lying on your back to sitting on the side of a flat bed without using bedrails?: A Little Help needed moving to and from a bed to a chair (including a wheelchair)?: A Little Help needed standing up from a chair using your arms (e.g., wheelchair or bedside  chair)?: A Little Help needed to walk in hospital room?: A Lot Help needed climbing 3-5 steps with a railing? : A Lot 6 Click Score: 16    End of Session Equipment Utilized During Treatment: Gait belt Activity Tolerance: Patient limited by pain Patient left: in bed;with call bell/phone within reach;with nursing/sitter in room Nurse Communication: Mobility status;Other (comment) (high pain) PT Visit Diagnosis: Other abnormalities of gait and mobility (R26.89);Pain Pain - Right/Left: Left Pain - part of body: Knee    Time: 1505-1530 PT Time Calculation (min) (ACUTE ONLY): 25 min   Charges:   PT Evaluation $PT Eval Low Complexity: 1 Low PT Treatments $Therapeutic Activity: 8-22 mins         Tori Aubriana Ravelo PT, DPT 12/02/20, 5:03 PM

## 2020-12-02 NOTE — Anesthesia Procedure Notes (Signed)
Spinal  Patient location during procedure: OR Start time: 12/02/2020 10:10 AM End time: 12/02/2020 10:17 AM Staffing Performed: anesthesiologist  Anesthesiologist: Lannie Fields, DO Preanesthetic Checklist Completed: patient identified, IV checked, risks and benefits discussed, surgical consent, monitors and equipment checked, pre-op evaluation and timeout performed Spinal Block Patient position: sitting Prep: DuraPrep and site prepped and draped Patient monitoring: cardiac monitor, continuous pulse ox and blood pressure Approach: midline Location: L3-4 Injection technique: single-shot Needle Needle type: Pencan  Needle gauge: 24 G Needle length: 9 cm Assessment Sensory level: T6 Additional Notes 1 prior attempt by crna

## 2020-12-03 ENCOUNTER — Encounter (HOSPITAL_COMMUNITY): Payer: Self-pay | Admitting: Orthopedic Surgery

## 2020-12-03 ENCOUNTER — Other Ambulatory Visit: Payer: Self-pay

## 2020-12-03 DIAGNOSIS — M25562 Pain in left knee: Secondary | ICD-10-CM | POA: Diagnosis not present

## 2020-12-03 DIAGNOSIS — J45909 Unspecified asthma, uncomplicated: Secondary | ICD-10-CM | POA: Diagnosis not present

## 2020-12-03 DIAGNOSIS — Z87891 Personal history of nicotine dependence: Secondary | ICD-10-CM | POA: Diagnosis not present

## 2020-12-03 DIAGNOSIS — M1712 Unilateral primary osteoarthritis, left knee: Secondary | ICD-10-CM | POA: Diagnosis not present

## 2020-12-03 DIAGNOSIS — Z79899 Other long term (current) drug therapy: Secondary | ICD-10-CM | POA: Diagnosis not present

## 2020-12-03 LAB — CBC
HCT: 34.1 % — ABNORMAL LOW (ref 36.0–46.0)
Hemoglobin: 11 g/dL — ABNORMAL LOW (ref 12.0–15.0)
MCH: 31.5 pg (ref 26.0–34.0)
MCHC: 32.3 g/dL (ref 30.0–36.0)
MCV: 97.7 fL (ref 80.0–100.0)
Platelets: 222 10*3/uL (ref 150–400)
RBC: 3.49 MIL/uL — ABNORMAL LOW (ref 3.87–5.11)
RDW: 12 % (ref 11.5–15.5)
WBC: 13.9 10*3/uL — ABNORMAL HIGH (ref 4.0–10.5)
nRBC: 0 % (ref 0.0–0.2)

## 2020-12-03 LAB — BASIC METABOLIC PANEL
Anion gap: 7 (ref 5–15)
BUN: 18 mg/dL (ref 8–23)
CO2: 24 mmol/L (ref 22–32)
Calcium: 8.7 mg/dL — ABNORMAL LOW (ref 8.9–10.3)
Chloride: 103 mmol/L (ref 98–111)
Creatinine, Ser: 0.48 mg/dL (ref 0.44–1.00)
GFR, Estimated: >60 mL/min (ref >60)
Glucose, Bld: 139 mg/dL — ABNORMAL HIGH (ref 70–99)
Potassium: 4.6 mmol/L (ref 3.5–5.1)
Sodium: 134 mmol/L — ABNORMAL LOW (ref 135–145)

## 2020-12-03 NOTE — Plan of Care (Signed)
Plan of care reviewed and discussed with the patient. 

## 2020-12-03 NOTE — Plan of Care (Signed)
Pt ready for DC home with husband 

## 2020-12-03 NOTE — Progress Notes (Signed)
Physical Therapy Treatment Patient Details Name: Terri [name] MRN: [id number] DOB: 1956/02/05 Today's Date: 12/03/2020    History of Present Illness Terri [name] is a 65 y.o. female with past medical history significant for arthritis and obestiy. Pt is s/p L TKA 12/02/20.    PT Comments    POD # 1 pm session Assisted to bathroom then amb in hallway to practice stairs.  General stair comments: pt has 6 steps and 2 rails at home but rails are too far apart to reach both.  Practiced stairs using one rail with instructions to "locK" TKR knee before going "up with the good and down with the bad".  Pt has to walk about 30 feet from car to the stairs and plans to take a seated rest break at base of stairs before attempting her 6. Then returned to room to perform some TE's following HEP handout.  Instructed on proper tech, freq as well as use of ICE.   Addressed all mobility questions, discussed appropriate activity, educated on use of ICE.  Pt ready for D/C to home.   Follow Up Recommendations  Follow surgeon's recommendation for DC plan and follow-up therapies     Equipment Recommendations  3in1 (PT)    Recommendations for Other Services       Precautions / Restrictions Precautions Precautions: Fall;Knee Precaution Comments: instructed no pillow under knee Restrictions Weight Bearing Restrictions: No Other Position/Activity Restrictions: WBAT    Mobility  Bed Mobility               General bed mobility comments: OOB in recliner    Transfers Overall transfer level: Needs assistance Equipment used: Rolling walker (2 wheeled) Transfers: Sit to/from [employer] Sit to Stand: Min guard Stand pivot transfers: Min guard;Min assist       General transfer comment: 25% VC's on proper hand placement and safety with turns  Ambulation/Gait Ambulation/Gait assistance: Min guard Gait Distance (Feet): 32 Feet Assistive device: Rolling walker (2 wheeled) Gait  Pattern/deviations: Step-to pattern Gait velocity: decreased   General Gait Details: 50% VC's on proper walker to self distance, proper sequencing and safety with turns.  Tolerated an increased distance.   Stairs Stairs: Yes Stairs assistance: Min guard Stair Management: One rail Left Number of Stairs: 2 General stair comments: pt has 6 steps and 2 rails at home but rails are too far apart to reach both.  Practiced stairs using one rail with instructions to "locK" TKR knee before going "up with the good and down with the bad".  Pt has to walk about 30 feet from car to the stairs and plans to take a seated rest break at base of stairs before attempting her 6.   Wheelchair Mobility    Modified Rankin (Stroke Patients Only)       Balance                                            Cognition Arousal/Alertness: Awake/alert Behavior During Therapy: WFL for tasks assessed/performed Overall Cognitive Status: Within Functional Limits for tasks assessed                                 General Comments: AxO x 3 very pleasant      Exercises  05 reps all seated TKR TE's following HEP followed by  ICE    General Comments        Pertinent Vitals/Pain Pain Assessment: 0-10 Pain Score: 5  Pain Location: L knee Pain Descriptors / Indicators: Aching;Grimacing;Sore;Operative site guarding Pain Intervention(s): Monitored during session;Premedicated before session;Repositioned;Ice applied    Home Living                      Prior Function            PT Goals (current goals can now be found in the care plan section) Progress towards PT goals: Progressing toward goals    Frequency           PT Plan Current plan remains appropriate    Co-evaluation              AM-PAC PT "6 Clicks" Mobility   Outcome Measure  Help needed turning from your back to your side while in a flat bed without using bedrails?: A Little Help needed  moving from lying on your back to sitting on the side of a flat bed without using bedrails?: A Little Help needed moving to and from a bed to a chair (including a wheelchair)?: A Little Help needed standing up from a chair using your arms (e.g., wheelchair or bedside chair)?: A Little Help needed to walk in hospital room?: A Lot Help needed climbing 3-5 steps with a railing? : A Lot 6 Click Score: 16    End of Session Equipment Utilized During Treatment: Gait belt Activity Tolerance: Patient tolerated treatment well Patient left: in chair;with call bell/phone within reach Nurse Communication: Mobility status PT Visit Diagnosis: Other abnormalities of gait and mobility (R26.89);Pain Pain - Right/Left: Left Pain - part of body: Knee     Time: 7096-2836 PT Time Calculation (min) (ACUTE ONLY): 28 min  Charges:  $Gait Training: 8-22 mins $Therapeutic Activity: 8-22 mins                     Felecia Shelling  PTA Acute  Rehabilitation Services Pager      [phone] Office      [phone]

## 2020-12-03 NOTE — Progress Notes (Signed)
Physical Therapy Treatment Patient Details Name: Terri [name] MRN: [id number] DOB: 1955-10-29 Today's Date: 12/03/2020    History of Present Illness Terri [name] is a 65 y.o. female with past medical history significant for arthritis and obestiy. Pt is s/p L TKA 12/02/20.    PT Comments    POD # 1 am session Pt OOB in recliner.  Assisted with amb a short distance in hallway.  General transfer comment: 25% VC's on proper hand placement and safety with turns.  General Gait Details: 50% VC's on proper walker to self distance, proper sequencing and safety with turns.  Tolerated an increased distance. Then returned to room to perform some TE's following HEP handout.  Instructed on proper tech, freq as well as use of ICE.   Pt will need another PT session to complete HEP and address stairs.   Follow Up Recommendations  Follow surgeon's recommendation for DC plan and follow-up therapies     Equipment Recommendations  3in1 (PT)    Recommendations for Other Services       Precautions / Restrictions Precautions Precautions: Fall;Knee Precaution Comments: instructed no pillow under knee Restrictions Weight Bearing Restrictions: No Other Position/Activity Restrictions: WBAT    Mobility  Bed Mobility               General bed mobility comments: OOB in recliner    Transfers Overall transfer level: Needs assistance Equipment used: Rolling walker (2 wheeled) Transfers: Sit to/from [employer] Sit to Stand: Min guard Stand pivot transfers: Min guard;Min assist       General transfer comment: 25% VC's on proper hand placement and safety with turns  Ambulation/Gait Ambulation/Gait assistance: Min guard Gait Distance (Feet): 22 Feet Assistive device: Rolling walker (2 wheeled) Gait Pattern/deviations: Step-to pattern Gait velocity: decreased   General Gait Details: 50% VC's on proper walker to self distance, proper sequencing and safety with turns.  Tolerated an  increased distance.   Stairs             Wheelchair Mobility    Modified Rankin (Stroke Patients Only)       Balance                                            Cognition Arousal/Alertness: Awake/alert Behavior During Therapy: WFL for tasks assessed/performed Overall Cognitive Status: Within Functional Limits for tasks assessed                                 General Comments: AxO x 3 very pleasant      Exercises   Total Knee Replacement TE's following HEP handout 10 reps B LE ankle pumps 05 reps towel squeezes 05 reps knee presses 05 reps heel slides  05 reps SAQ's 05 reps SLR's 05 reps ABD Educated on use of gait belt to assist with TE's Followed by ICE     General Comments        Pertinent Vitals/Pain Pain Assessment: 0-10 Pain Score: 5  Pain Location: L knee Pain Descriptors / Indicators: Aching;Grimacing;Sore;Operative site guarding Pain Intervention(s): Monitored during session;Premedicated before session;Repositioned;Ice applied    Home Living                      Prior Function  PT Goals (current goals can now be found in the care plan section) Progress towards PT goals: Progressing toward goals    Frequency           PT Plan Current plan remains appropriate    Co-evaluation              AM-PAC PT "6 Clicks" Mobility   Outcome Measure  Help needed turning from your back to your side while in a flat bed without using bedrails?: A Little Help needed moving from lying on your back to sitting on the side of a flat bed without using bedrails?: A Little Help needed moving to and from a bed to a chair (including a wheelchair)?: A Little Help needed standing up from a chair using your arms (e.g., wheelchair or bedside chair)?: A Little Help needed to walk in hospital room?: A Lot Help needed climbing 3-5 steps with a railing? : A Lot 6 Click Score: 16    End of Session  Equipment Utilized During Treatment: Gait belt Activity Tolerance: Patient tolerated treatment well Patient left: in chair;with call bell/phone within reach Nurse Communication: Mobility status PT Visit Diagnosis: Other abnormalities of gait and mobility (R26.89);Pain Pain - Right/Left: Left Pain - part of body: Knee     Time: 9622-2979 PT Time Calculation (min) (ACUTE ONLY): 31 min  Charges:  $Gait Training: 8-22 mins $Therapeutic Exercise: 8-22 mins                     Felecia Shelling  PTA Acute  Rehabilitation Services Pager      [phone] Office      [phone]

## 2020-12-03 NOTE — Progress Notes (Signed)
Subjective: 1 Day Post-Op Procedure(s) (LRB): TOTAL KNEE ARTHROPLASTY (Left) Patient reports pain as mild.   Patient seen in rounds by Dr. Lequita Halt. Patient is well, and has had no acute complaints or problems. States she is ready to go home. No issues overnight, denies chest pain, SOB, or calf pain. Foley catheter to be removed this AM. We will continue therapy today.   Objective: Vital signs in last 24 hours: Temp:  [98 F (36.7 C)-99.2 F (37.3 C)] 98.3 F (36.8 C) (03/08 0605) Pulse Rate:  [57-92] 83 (03/08 0605) Resp:  [9-22] 16 (03/08 0605) BP: (90-156)/(63-95) 141/95 (03/08 0605) SpO2:  [92 %-100 %] 97 % (03/08 0605) Weight:  [109.8 kg] 109.8 kg (03/07 0831)  Intake/Output from previous day:  Intake/Output Summary (Last 24 hours) at 12/03/2020 0706 Last data filed at 12/03/2020 0605 Gross per 24 hour  Intake 2723.17 ml  Output 1950 ml  Net 773.17 ml     Intake/Output this shift: No intake/output data recorded.  Labs: Recent Labs    12/03/20 0311  HGB 11.0*   Recent Labs    12/03/20 0311  WBC 13.9*  RBC 3.49*  HCT 34.1*  PLT 222   Recent Labs    12/03/20 0311  NA 134*  K 4.6  CL 103  CO2 24  BUN 18  CREATININE 0.48  GLUCOSE 139*  CALCIUM 8.7*   No results for input(s): LABPT, INR in the last 72 hours.  Exam: General - Patient is Alert and Oriented Extremity - Neurologically intact Neurovascular intact Sensation intact distally Dorsiflexion/Plantar flexion intact Dressing - dressing C/D/I Motor Function - intact, moving foot and toes well on exam.   Past Medical History:  Diagnosis Date  . ADD (attention deficit disorder)   . Allergy    seasonal  . Arthritis   . Asthma    mild  . Blood transfusion without reported diagnosis   . Cataract   . Complication of anesthesia    can be difficult to wake up/need prompts to breathe  . GERD (gastroesophageal reflux disease)    Barrett's  . Obesity   . Pneumonia   . Sleep apnea    Uses c  pap, but sleep apnea resolved after surgery    Assessment/Plan: 1 Day Post-Op Procedure(s) (LRB): TOTAL KNEE ARTHROPLASTY (Left) Principal Problem:   OA (osteoarthritis) of knee Active Problems:   Osteoarthritis of left knee  Estimated body mass index is 39.06 kg/m as calculated from the following:   Height as of this encounter: 5\' 6"  (1.676 m).   Weight as of this encounter: 109.8 kg. Advance diet Up with therapy D/C IV fluids   Patient's anticipated LOS is less than 2 midnights, meeting these requirements: - Younger than 62 - Lives within 1 hour of care - Has a competent adult at home to recover with post-op recover - NO history of  - Chronic pain requiring opiods  - Diabetes  - Coronary Artery Disease  - Heart failure  - Heart attack  - Stroke  - DVT/VTE  - Cardiac arrhythmia  - Respiratory Failure/COPD  - Renal failure  - Anemia  - Advanced Liver disease  DVT Prophylaxis - Xarelto Weight bearing as tolerated. Continue therapy.  Plan is to go Home after hospital stay. Plan for discharge later today if progresses with therapy and is meeting her goals. Postoperative medications have already been sent in for the patient.  Outpatient therapy scheduled at [facility] Follow-up in the office in 2 weeks  Arther Abbott, PA-C Orthopedic Surgery [phone] 12/03/2020, 7:06 AM

## 2020-12-03 NOTE — TOC Transition Note (Signed)
Transition of Care [facility]) - CM/SW Discharge Note   Patient Details  Name: Terri [name] MRN: [id number] Date of Birth: [birth year]  Transition of Care [facility]) CM/SW Contact:  Lennart Pall, LCSW Phone Number: 12/03/2020, 10:50 AM   Clinical Narrative:    Met with pt this morning to review dc needs.  Pt confirms need for 3n1 commode - ordered via [place]. Plan for OPPT at [facility].  NO further TOC needs.   Final next level of care: OP Rehab Barriers to Discharge: No Barriers Identified   Patient Goals and CMS Choice Patient states their goals for this hospitalization and ongoing recovery are:: return home      Discharge Placement                       Discharge Plan and Services                DME Arranged: 3-N-1 DME Agency: [employer] Date DME Agency Contacted: 12/03/20 Time DME Agency Contacted: [id number] Representative spoke with at DME Agency: [facility] ([place]) Interventions     Readmission Risk Interventions No flowsheet data found.

## 2020-12-05 DIAGNOSIS — G4733 Obstructive sleep apnea (adult) (pediatric): Secondary | ICD-10-CM | POA: Diagnosis not present

## 2020-12-05 DIAGNOSIS — M25562 Pain in left knee: Secondary | ICD-10-CM | POA: Diagnosis not present

## 2020-12-09 DIAGNOSIS — M25562 Pain in left knee: Secondary | ICD-10-CM | POA: Diagnosis not present

## 2020-12-09 NOTE — Discharge Summary (Signed)
Physician Discharge Summary   Patient ID: Terri LeitzLynn Delmonaco MRN: [id number] DOB/AGE: 63/04/1956 63 y.o.  Admit date: 12/02/2020 Discharge date: 12/03/2020  Primary Diagnosis: Osteoarthritis, left knee   Admission Diagnoses:  Past Medical History:  Diagnosis Date  . ADD (attention deficit disorder)   . Allergy    seasonal  . Arthritis   . Asthma    mild  . Blood transfusion without reported diagnosis   . Cataract   . Complication of anesthesia    can be difficult to wake up/need prompts to breathe  . GERD (gastroesophageal reflux disease)    Barrett's  . Obesity   . Pneumonia   . Sleep apnea    Uses c pap, but sleep apnea resolved after surgery   Discharge Diagnoses:   Principal Problem:   OA (osteoarthritis) of knee Active Problems:   Osteoarthritis of left knee  Estimated body mass index is 39.06 kg/m as calculated from the following:   Height as of this encounter: 5\' 6"  (1.676 m).   Weight as of this encounter: 109.8 kg.  Procedure:  Procedure(s) (LRB): TOTAL KNEE ARTHROPLASTY (Left)   Consults: None  HPI:  Terri [name] is a 65 y.o. year old female with end stage OA of her left knee with progressively worsening pain and dysfunction. She has constant pain, with activity and at rest and significant functional deficits with difficulties even with ADLs. She has had extensive non-op management including analgesics, injections of cortisone and viscosupplements, and home exercise program, but remains in significant pain with significant dysfunction. Radiographs show bone on bone arthritis medial and patellofemoral. She presents now for left Total Knee Arthroplasty  Laboratory Data: Admission on 12/02/2020, Discharged on 12/03/2020  Component Date Value Ref Range Status  . WBC 12/03/2020 13.9* 4.0 - 10.5 K/uL Final  . RBC 12/03/2020 3.49* 3.87 - 5.11 MIL/uL Final  . Hemoglobin 12/03/2020 11.0* 12.0 - 15.0 g/dL Final  . HCT 40/98/119103/04/2021 34.1* 36.0 - 46.0 % Final  . MCV 12/03/2020  97.7  80.0 - 100.0 fL Final  . MCH 12/03/2020 31.5  26.0 - 34.0 pg Final  . MCHC 12/03/2020 32.3  30.0 - 36.0 g/dL Final  . RDW 47/82/956203/04/2021 12.0  11.5 - 15.5 % Final  . Platelets 12/03/2020 222  150 - 400 K/uL Final  . nRBC 12/03/2020 0.0  0.0 - 0.2 % Final   Performed at [facility], [address] [place], [place] [address]  . Sodium 12/03/2020 134* 135 - 145 mmol/L Final  . Potassium 12/03/2020 4.6  3.5 - 5.1 mmol/L Final  . Chloride 12/03/2020 103  98 - 111 mmol/L Final  . CO2 12/03/2020 24  22 - 32 mmol/L Final  . Glucose, Bld 12/03/2020 139* 70 - 99 mg/dL Final   Glucose reference range applies only to samples taken after fasting for at least 8 hours.  . BUN 12/03/2020 18  8 - 23 mg/dL Final  . Creatinine, Ser 12/03/2020 0.48  0.44 - 1.00 mg/dL Final  . Calcium 57/84/696203/04/2021 8.7* 8.9 - 10.3 mg/dL Final  . GFR, Estimated 12/03/2020 >60  >60 mL/min Final   Comment: (NOTE) Calculated using the CKD-EPI Creatinine Equation (2021)   . Anion gap 12/03/2020 7  5 - 15 Final   Performed at [facility], [address] [place], [place] [address]  Hospital Outpatient Visit on 11/28/2020  Component Date Value Ref Range Status  . SARS Coronavirus 2 11/28/2020 NEGATIVE  NEGATIVE Final   Comment: (NOTE) SARS-CoV-2 target  nucleic acids are NOT DETECTED.  The SARS-CoV-2 RNA is generally detectable in upper and lower respiratory specimens during the acute phase of infection. Negative results do not preclude SARS-CoV-2 infection, do not rule out co-infections with other pathogens, and should not be used as the sole basis for treatment or other patient management decisions. Negative results must be combined with clinical observations, patient history, and epidemiological information. The expected result is Negative.  Fact Sheet for Patients: HairSlick.no  Fact Sheet for Healthcare  Providers: quierodirigir.com  This test is not yet approved or cleared by the [place] FDA and  has been authorized for detection and/or diagnosis of SARS-CoV-2 by FDA under an Emergency Use Authorization (EUA). This EUA will remain  in effect (meaning this test can be used) for the duration of the COVID-19 declaration under Se                          ction 564(b)(1) of the Act, 21 U.S.C. section 360bbb-3(b)(1), unless the authorization is terminated or revoked sooner.  Performed at [facility], [address], [place], [place] [address]   Hospital Outpatient Visit on 11/26/2020  Component Date Value Ref Range Status  . MRSA, PCR 11/26/2020 NEGATIVE  NEGATIVE Final  . Staphylococcus aureus 11/26/2020 NEGATIVE  NEGATIVE Final   Comment: (NOTE) The Xpert SA Assay (FDA approved for NASAL specimens in patients 9 years of age and older), is one component of a comprehensive surveillance program. It is not intended to diagnose infection nor to guide or monitor treatment. Performed at [facility], [address] [place], [place] [address]   . WBC 11/26/2020 4.8  4.0 - 10.5 K/uL Final  . RBC 11/26/2020 4.28  3.87 - 5.11 MIL/uL Final  . Hemoglobin 11/26/2020 13.5  12.0 - 15.0 g/dL Final  . HCT 48/09/6551 41.3  36.0 - 46.0 % Final  . MCV 11/26/2020 96.5  80.0 - 100.0 fL Final  . MCH 11/26/2020 31.5  26.0 - 34.0 pg Final  . MCHC 11/26/2020 32.7  30.0 - 36.0 g/dL Final  . RDW 74/82/7078 12.1  11.5 - 15.5 % Final  . Platelets 11/26/2020 244  150 - 400 K/uL Final  . nRBC 11/26/2020 0.0  0.0 - 0.2 % Final   Performed at [facility], [address] [place], [place] [address]  . Sodium 11/26/2020 140  135 - 145 mmol/L Final  . Potassium 11/26/2020 4.6  3.5 - 5.1 mmol/L Final  . Chloride 11/26/2020 104  98 - 111 mmol/L Final  . CO2 11/26/2020 30  22 - 32 mmol/L Final  . Glucose, Bld 11/26/2020 105* 70 - 99 mg/dL  Final   Glucose reference range applies only to samples taken after fasting for at least 8 hours.  . BUN 11/26/2020 16  8 - 23 mg/dL Final  . Creatinine, Ser 11/26/2020 0.65  0.44 - 1.00 mg/dL Final  . Calcium 92/09/69 9.2  8.9 - 10.3 mg/dL Final  . Total Protein 11/26/2020 7.3  6.5 - 8.1 g/dL Final  . Albumin 21/97/5883 4.2  3.5 - 5.0 g/dL Final  . AST 25/49/8264 30  15 - 41 U/L Final  . ALT 11/26/2020 28  0 - 44 U/L Final  . Alkaline Phosphatase 11/26/2020 76  38 - 126 U/L Final  . Total Bilirubin 11/26/2020 0.5  0.3 - 1.2 mg/dL Final  . GFR, Estimated 11/26/2020 >60  >60 mL/min Final   Comment: (  NOTE) Calculated using the CKD-EPI Creatinine Equation (2021)   . Anion gap 11/26/2020 6  5 - 15 Final   Performed at [facility], [address], [place], [place] [address]  . Prothrombin Time 11/26/2020 12.0  11.4 - 15.2 seconds Final  . INR 11/26/2020 0.9  0.8 - 1.2 Final   Comment: (NOTE) INR goal varies based on device and disease states. Performed at [facility], [address] [place], [place] [address]   . aPTT 11/26/2020 32  24 - 36 seconds Final   Performed at [facility], [address] [place], [place] [address]  . ABO/RH(D) 11/26/2020 O POS   Final  . Antibody Screen 11/26/2020 NEG   Final  . Sample Expiration 11/26/2020 12/05/2020,2359   Final  . Extend sample reason 11/26/2020    Final                   Value:NO TRANSFUSIONS OR PREGNANCY IN THE PAST 3 MONTHS Performed at [facility], [address] [place], [place] [address]   Hospital Outpatient Visit on 10/09/2020  Component Date Value Ref Range Status  . MRSA, PCR 10/09/2020 NEGATIVE  NEGATIVE Final  . Staphylococcus aureus 10/09/2020 NEGATIVE  NEGATIVE Final   Comment: (NOTE) The Xpert SA Assay (FDA approved for NASAL specimens in patients 63 years of age and older), is one component of a comprehensive surveillance program. It is  not intended to diagnose infection nor to guide or monitor treatment. Performed at [facility], [address] [place], [place] [address]   . WBC 10/09/2020 5.8  4.0 - 10.5 K/uL Final  . RBC 10/09/2020 4.03  3.87 - 5.11 MIL/uL Final  . Hemoglobin 10/09/2020 12.9  12.0 - 15.0 g/dL Final  . HCT 57/84/6962 39.0  36.0 - 46.0 % Final  . MCV 10/09/2020 96.8  80.0 - 100.0 fL Final  . MCH 10/09/2020 32.0  26.0 - 34.0 pg Final  . MCHC 10/09/2020 33.1  30.0 - 36.0 g/dL Final  . RDW 95/28/4132 12.2  11.5 - 15.5 % Final  . Platelets 10/09/2020 258  150 - 400 K/uL Final  . nRBC 10/09/2020 0.0  0.0 - 0.2 % Final   Performed at [facility], [address], [place], [place] [address]  . Sodium 10/09/2020 138  135 - 145 mmol/L Final  . Potassium 10/09/2020 4.5  3.5 - 5.1 mmol/L Final  . Chloride 10/09/2020 104  98 - 111 mmol/L Final  . CO2 10/09/2020 25  22 - 32 mmol/L Final  . Glucose, Bld 10/09/2020 91  70 - 99 mg/dL Final   Glucose reference range applies only to samples taken after fasting for at least 8 hours.  . BUN 10/09/2020 20  8 - 23 mg/dL Final  . Creatinine, Ser 10/09/2020 0.63  0.44 - 1.00 mg/dL Final  . Calcium 27/25/3664 9.1  8.9 - 10.3 mg/dL Final  . Total Protein 10/09/2020 6.5  6.5 - 8.1 g/dL Final  . Albumin 40/34/7425 4.0  3.5 - 5.0 g/dL Final  . AST 95/63/8756 29  15 - 41 U/L Final  . ALT 10/09/2020 27  0 - 44 U/L Final  . Alkaline Phosphatase 10/09/2020 75  38 - 126 U/L Final  . Total Bilirubin 10/09/2020 0.5  0.3 - 1.2 mg/dL Final  . GFR, Estimated 10/09/2020 >60  >60 mL/min Final   Comment: (NOTE) Calculated using the CKD-EPI Creatinine Equation (2021)   .  Anion gap 10/09/2020 9  5 - 15 Final   Performed at [facility], [address], [place], [place] [address]  . Prothrombin Time 10/09/2020 12.8  11.4 - 15.2 seconds Final  . INR 10/09/2020 1.0  0.8 - 1.2 Final   Comment: (NOTE) INR goal varies based on  device and disease states. Performed at [facility], [address], [place], [place] [address]   . aPTT 10/09/2020 31  24 - 36 seconds Final   Performed at [facility], [address] [place], [place] [address]  . ABO/RH(D) 10/09/2020 O POS   Final  . Antibody Screen 10/09/2020 NEG   Final  . Sample Expiration 10/09/2020 10/23/2020,2359   Final  . Extend sample reason 10/09/2020    Final                   Value:NO TRANSFUSIONS OR PREGNANCY IN THE PAST 3 MONTHS Performed at [facility], [address], [place], [place] [address]      X-Rays:No results found.  EKG: Orders placed or performed during the hospital encounter of 07/04/15  . EKG 12-Lead  . EKG 12-Lead  . ED EKG within 10 minutes  . ED EKG within 10 minutes  . EKG     Hospital Course: Ashle Stief is a 65 y.o. who was admitted to [facility]. They were brought to the operating room on 12/02/2020 and underwent Procedure(s): TOTAL KNEE ARTHROPLASTY.  Patient tolerated the procedure well and was later transferred to the recovery room and then to the orthopaedic floor for postoperative care. They were given PO and IV analgesics for pain control following their surgery. They were given 24 hours of postoperative antibiotics of  Anti-infectives (From admission, onward)   Start     Dose/Rate Route Frequency Ordered Stop   12/02/20 1600  ceFAZolin (ANCEF) IVPB 2g/100 mL premix        2 g 200 mL/hr over 30 Minutes Intravenous Every 6 hours 12/02/20 1332 12/02/20 2252   12/02/20 0815  ceFAZolin (ANCEF) IVPB 2g/100 mL premix        2 g 200 mL/hr over 30 Minutes Intravenous On call to O.R. 12/02/20 5631 12/02/20 1042     and started on DVT prophylaxis in the form of Xarelto.   PT and OT were ordered for total joint protocol. Discharge planning consulted to help with postop disposition and equipment needs.  Patient had a good night on the evening of surgery. They  started to get up OOB with therapy on POD #0. Pt was seen during rounds and was ready to go home pending progress with therapy. She worked with therapy on POD #1 and was meeting her goals. Pt was discharged to home later that day in stable condition.  Diet: Regular diet Activity: WBAT Follow-up: in 2 weeks Disposition: Home with OPPT Discharged Condition: stable   Discharge Instructions    Call MD / Call 911   Complete by: As directed    If you experience chest pain or shortness of breath, CALL 911 and be transported to the hospital emergency room.  If you develope a fever above 101 F, pus (white drainage) or increased drainage or redness at the wound, or calf pain, call your surgeon's office.   Change dressing   Complete by: As directed    You may remove the bulky bandage (ACE wrap and gauze) two days after surgery. You will have an adhesive waterproof bandage  underneath. Leave this in place until your first follow-up appointment.   Constipation Prevention   Complete by: As directed    Drink plenty of fluids.  Prune juice may be helpful.  You may use a stool softener, such as Colace (over the counter) 100 mg twice a day.  Use MiraLax (over the counter) for constipation as needed.   Diet - low sodium heart healthy   Complete by: As directed    Do not put a pillow under the knee. Place it under the heel.   Complete by: As directed    Driving restrictions   Complete by: As directed    No driving for two weeks   TED hose   Complete by: As directed    Use stockings (TED hose) for three weeks on both leg(s).  You may remove them at night for sleeping.   Weight bearing as tolerated   Complete by: As directed      Allergies as of 12/03/2020   No Known Allergies     Medication List    TAKE these medications   Advair Diskus 250-50 MCG/DOSE Aepb Generic drug: Fluticasone-Salmeterol Inhale 1 puff into the lungs 2 (two) times daily.   Azelastine HCl 0.15 % Soln Place 1 spray into the  nose daily.   escitalopram 20 MG tablet Commonly known as: LEXAPRO Take 20 mg by mouth daily.   fluticasone 50 MCG/ACT nasal spray Commonly known as: FLONASE Place 1-2 sprays into both nostrils daily.   levalbuterol 45 MCG/ACT inhaler Commonly known as: XOPENEX HFA Inhale 1-2 puffs into the lungs every 6 (six) hours as needed for shortness of breath or wheezing.   levocetirizine 5 MG tablet Commonly known as: XYZAL Take 2.5 mg by mouth every evening.   lisdexamfetamine 60 MG capsule Commonly known as: VYVANSE Take 60 mg by mouth every morning.   montelukast 10 MG tablet Commonly known as: SINGULAIR Take 10 mg by mouth every evening.   omeprazole 40 MG capsule Commonly known as: PRILOSEC Take 1 capsule (40 mg total) by mouth 2 (two) times daily.   zolpidem 10 MG tablet Commonly known as: AMBIEN Take 10 mg by mouth at bedtime as needed for sleep. For sleep            Discharge Care Instructions  (From admission, onward)         Start     Ordered   12/03/20 0000  Weight bearing as tolerated        12/03/20 0713   12/03/20 0000  Change dressing       Comments: You may remove the bulky bandage (ACE wrap and gauze) two days after surgery. You will have an adhesive waterproof bandage underneath. Leave this in place until your first follow-up appointment.   12/03/20 4827          Follow-up Information    Ollen Gross, MD. Schedule an appointment as soon as possible for a visit on 12/17/2020.   Specialty: Orthopedic Surgery Contact information: [address] [place] [address] [place] [address] [phone]               Signed: Arther Abbott, PA-C Orthopedic Surgery 12/09/2020, 7:20 AM

## 2020-12-10 DIAGNOSIS — F331 Major depressive disorder, recurrent, moderate: Secondary | ICD-10-CM | POA: Diagnosis not present

## 2020-12-10 DIAGNOSIS — F411 Generalized anxiety disorder: Secondary | ICD-10-CM | POA: Diagnosis not present

## 2020-12-10 DIAGNOSIS — F900 Attention-deficit hyperactivity disorder, predominantly inattentive type: Secondary | ICD-10-CM | POA: Diagnosis not present

## 2020-12-13 DIAGNOSIS — M25562 Pain in left knee: Secondary | ICD-10-CM | POA: Diagnosis not present

## 2020-12-17 DIAGNOSIS — M25562 Pain in left knee: Secondary | ICD-10-CM | POA: Diagnosis not present

## 2020-12-18 ENCOUNTER — Other Ambulatory Visit: Payer: Self-pay | Admitting: Family Medicine

## 2020-12-18 DIAGNOSIS — Z1231 Encounter for screening mammogram for malignant neoplasm of breast: Secondary | ICD-10-CM

## 2020-12-19 DIAGNOSIS — M25562 Pain in left knee: Secondary | ICD-10-CM | POA: Diagnosis not present

## 2020-12-24 DIAGNOSIS — M25562 Pain in left knee: Secondary | ICD-10-CM | POA: Diagnosis not present

## 2020-12-26 DIAGNOSIS — M25562 Pain in left knee: Secondary | ICD-10-CM | POA: Diagnosis not present

## 2020-12-31 DIAGNOSIS — M25562 Pain in left knee: Secondary | ICD-10-CM | POA: Diagnosis not present

## 2021-01-02 DIAGNOSIS — M25562 Pain in left knee: Secondary | ICD-10-CM | POA: Diagnosis not present

## 2021-01-07 DIAGNOSIS — M25562 Pain in left knee: Secondary | ICD-10-CM | POA: Diagnosis not present

## 2021-01-07 DIAGNOSIS — Z4789 Encounter for other orthopedic aftercare: Secondary | ICD-10-CM | POA: Diagnosis not present

## 2021-01-09 DIAGNOSIS — M25562 Pain in left knee: Secondary | ICD-10-CM | POA: Diagnosis not present

## 2021-01-14 DIAGNOSIS — M25562 Pain in left knee: Secondary | ICD-10-CM | POA: Diagnosis not present

## 2021-01-15 DIAGNOSIS — H25812 Combined forms of age-related cataract, left eye: Secondary | ICD-10-CM | POA: Diagnosis not present

## 2021-01-15 DIAGNOSIS — H40053 Ocular hypertension, bilateral: Secondary | ICD-10-CM | POA: Diagnosis not present

## 2021-01-15 DIAGNOSIS — Z961 Presence of intraocular lens: Secondary | ICD-10-CM | POA: Diagnosis not present

## 2021-01-15 DIAGNOSIS — H40022 Open angle with borderline findings, high risk, left eye: Secondary | ICD-10-CM | POA: Diagnosis not present

## 2021-01-17 DIAGNOSIS — M25562 Pain in left knee: Secondary | ICD-10-CM | POA: Diagnosis not present

## 2021-01-23 DIAGNOSIS — Z0000 Encounter for general adult medical examination without abnormal findings: Secondary | ICD-10-CM | POA: Diagnosis not present

## 2021-01-23 DIAGNOSIS — Z5181 Encounter for therapeutic drug level monitoring: Secondary | ICD-10-CM | POA: Diagnosis not present

## 2021-01-23 DIAGNOSIS — E785 Hyperlipidemia, unspecified: Secondary | ICD-10-CM | POA: Diagnosis not present

## 2021-01-29 DIAGNOSIS — M25562 Pain in left knee: Secondary | ICD-10-CM | POA: Diagnosis not present

## 2021-02-17 ENCOUNTER — Ambulatory Visit: Payer: BC Managed Care – PPO

## 2021-03-05 ENCOUNTER — Ambulatory Visit: Payer: BC Managed Care – PPO

## 2021-03-05 DIAGNOSIS — G4733 Obstructive sleep apnea (adult) (pediatric): Secondary | ICD-10-CM | POA: Diagnosis not present

## 2021-03-07 DIAGNOSIS — Z23 Encounter for immunization: Secondary | ICD-10-CM | POA: Diagnosis not present

## 2021-03-11 DIAGNOSIS — F411 Generalized anxiety disorder: Secondary | ICD-10-CM | POA: Diagnosis not present

## 2021-03-11 DIAGNOSIS — F331 Major depressive disorder, recurrent, moderate: Secondary | ICD-10-CM | POA: Diagnosis not present

## 2021-03-11 DIAGNOSIS — F900 Attention-deficit hyperactivity disorder, predominantly inattentive type: Secondary | ICD-10-CM | POA: Diagnosis not present

## 2021-04-17 DIAGNOSIS — U071 COVID-19: Secondary | ICD-10-CM | POA: Diagnosis not present

## 2021-04-17 DIAGNOSIS — J069 Acute upper respiratory infection, unspecified: Secondary | ICD-10-CM | POA: Diagnosis not present

## 2021-04-18 DIAGNOSIS — U071 COVID-19: Secondary | ICD-10-CM | POA: Diagnosis not present

## 2021-04-18 DIAGNOSIS — J069 Acute upper respiratory infection, unspecified: Secondary | ICD-10-CM | POA: Diagnosis not present

## 2021-05-12 DIAGNOSIS — H40002 Preglaucoma, unspecified, left eye: Secondary | ICD-10-CM | POA: Diagnosis not present

## 2021-05-12 DIAGNOSIS — H04123 Dry eye syndrome of bilateral lacrimal glands: Secondary | ICD-10-CM | POA: Diagnosis not present

## 2021-05-12 DIAGNOSIS — H40053 Ocular hypertension, bilateral: Secondary | ICD-10-CM | POA: Diagnosis not present

## 2021-05-14 DIAGNOSIS — Z23 Encounter for immunization: Secondary | ICD-10-CM | POA: Diagnosis not present

## 2021-05-29 DIAGNOSIS — F900 Attention-deficit hyperactivity disorder, predominantly inattentive type: Secondary | ICD-10-CM | POA: Diagnosis not present

## 2021-05-29 DIAGNOSIS — F331 Major depressive disorder, recurrent, moderate: Secondary | ICD-10-CM | POA: Diagnosis not present

## 2021-05-29 DIAGNOSIS — F411 Generalized anxiety disorder: Secondary | ICD-10-CM | POA: Diagnosis not present

## 2021-06-12 ENCOUNTER — Ambulatory Visit
Admission: RE | Admit: 2021-06-12 | Discharge: 2021-06-12 | Disposition: A | Discharge status: Home / Self Care | Payer: BC Managed Care – PPO | Source: Ambulatory Visit | Attending: Family Medicine | Admitting: Family Medicine

## 2021-06-12 ENCOUNTER — Other Ambulatory Visit: Payer: Self-pay

## 2021-06-12 DIAGNOSIS — Z1231 Encounter for screening mammogram for malignant neoplasm of breast: Secondary | ICD-10-CM

## 2021-06-19 DIAGNOSIS — G4733 Obstructive sleep apnea (adult) (pediatric): Secondary | ICD-10-CM | POA: Diagnosis not present

## 2021-06-27 DIAGNOSIS — J4530 Mild persistent asthma, uncomplicated: Secondary | ICD-10-CM | POA: Diagnosis not present

## 2021-06-27 DIAGNOSIS — J300 Vasomotor rhinitis: Secondary | ICD-10-CM | POA: Diagnosis not present

## 2021-06-27 DIAGNOSIS — K219 Gastro-esophageal reflux disease without esophagitis: Secondary | ICD-10-CM | POA: Diagnosis not present

## 2021-07-19 DIAGNOSIS — G4733 Obstructive sleep apnea (adult) (pediatric): Secondary | ICD-10-CM | POA: Diagnosis not present

## 2021-08-19 DIAGNOSIS — G4733 Obstructive sleep apnea (adult) (pediatric): Secondary | ICD-10-CM | POA: Diagnosis not present

## 2021-09-23 DIAGNOSIS — G4733 Obstructive sleep apnea (adult) (pediatric): Secondary | ICD-10-CM | POA: Diagnosis not present

## 2021-09-30 DIAGNOSIS — G4733 Obstructive sleep apnea (adult) (pediatric): Secondary | ICD-10-CM | POA: Diagnosis not present

## 2021-09-30 DIAGNOSIS — F331 Major depressive disorder, recurrent, moderate: Secondary | ICD-10-CM | POA: Diagnosis not present

## 2021-09-30 DIAGNOSIS — F411 Generalized anxiety disorder: Secondary | ICD-10-CM | POA: Diagnosis not present

## 2021-09-30 DIAGNOSIS — F900 Attention-deficit hyperactivity disorder, predominantly inattentive type: Secondary | ICD-10-CM | POA: Diagnosis not present

## 2021-10-22 DIAGNOSIS — R079 Chest pain, unspecified: Secondary | ICD-10-CM | POA: Diagnosis not present

## 2021-10-22 DIAGNOSIS — R42 Dizziness and giddiness: Secondary | ICD-10-CM | POA: Diagnosis not present

## 2021-10-24 DIAGNOSIS — G4733 Obstructive sleep apnea (adult) (pediatric): Secondary | ICD-10-CM | POA: Diagnosis not present

## 2021-10-31 DIAGNOSIS — G4733 Obstructive sleep apnea (adult) (pediatric): Secondary | ICD-10-CM | POA: Diagnosis not present

## 2021-11-19 DIAGNOSIS — G4733 Obstructive sleep apnea (adult) (pediatric): Secondary | ICD-10-CM | POA: Diagnosis not present

## 2021-11-24 DIAGNOSIS — G4733 Obstructive sleep apnea (adult) (pediatric): Secondary | ICD-10-CM | POA: Diagnosis not present

## 2021-11-28 DIAGNOSIS — G4733 Obstructive sleep apnea (adult) (pediatric): Secondary | ICD-10-CM | POA: Diagnosis not present

## 2021-12-29 DIAGNOSIS — G4733 Obstructive sleep apnea (adult) (pediatric): Secondary | ICD-10-CM | POA: Diagnosis not present

## 2021-12-30 DIAGNOSIS — F331 Major depressive disorder, recurrent, moderate: Secondary | ICD-10-CM | POA: Diagnosis not present

## 2021-12-30 DIAGNOSIS — F900 Attention-deficit hyperactivity disorder, predominantly inattentive type: Secondary | ICD-10-CM | POA: Diagnosis not present

## 2021-12-30 DIAGNOSIS — F411 Generalized anxiety disorder: Secondary | ICD-10-CM | POA: Diagnosis not present

## 2022-01-07 IMAGING — RF DG UGI W/ HIGH DENSITY W/O KUB
10 of 12 series · 12 of 24 positions shown · non-contrast
Comparison: Upper GI 08/15/2019

CLINICAL DATA: Long-term gastric banding device. Recent dysphagia
related to obstruction at the band. Saline removed from the band
cuff with improvement in esophageal obstructive symptoms.

EXAM:
UPPER GI SERIES WITH KUB
TECHNIQUE: After obtaining a scout radiograph a routine upper GI series was
performed using thin and high density barium.
FLUOROSCOPY TIME:  Fluoroscopy Time:  3.1 minutes
Radiation Exposure Index (if provided by the fluoroscopic device):
173.2 mGy
Number of Acquired Spot Images: 8

[Series 2: t abdomen supine · 0.15mm/px · 1 of 1 slices shown]
[im 1/1]
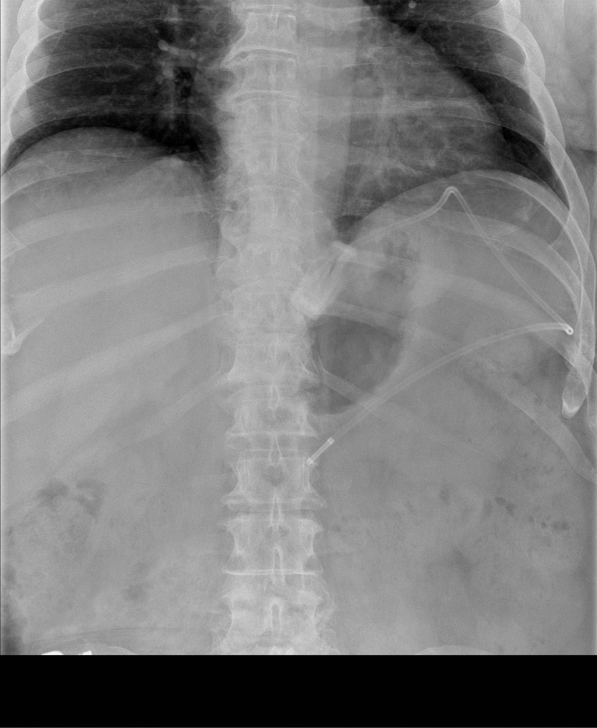

[Series 3: fluoro_barium 2fps_bw · 0.17mm/px · 1 of 2 frames shown (1 of 7)]
[frame 2/2]
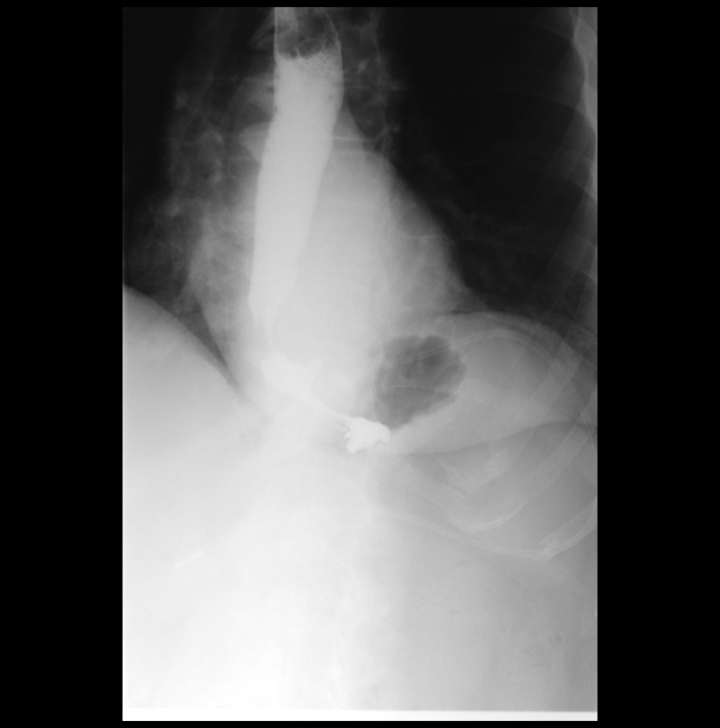

[Series 4: cp_standard · 0.34mm/px · 2 of 126 frames shown (1 of 2)]
[frame 41/126]
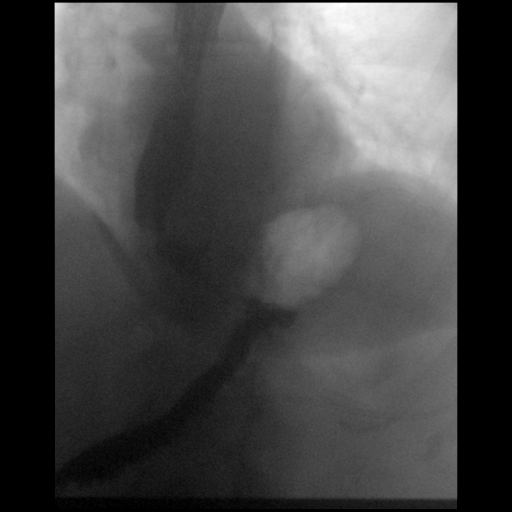
[frame 108/126]
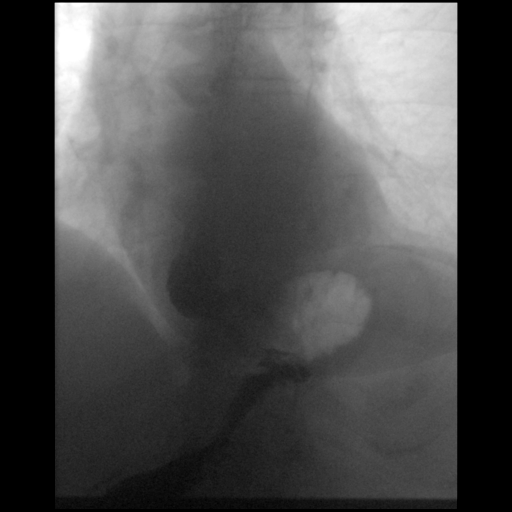

[Series 6: fluoro_barium 2fps_bw · 0.17mm/px · 1 of 2 frames shown (2 of 7)]
[frame 1/2]
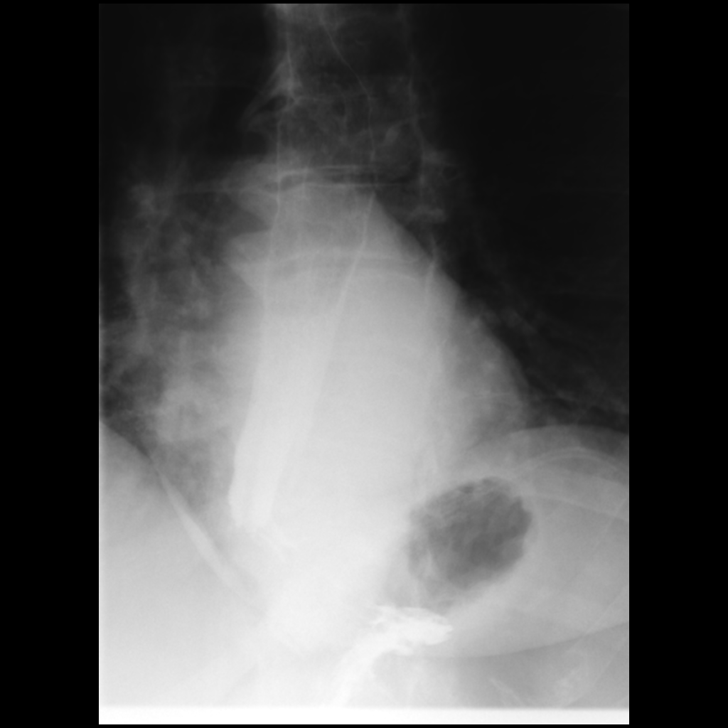

[Series 7: fluoro_barium 2fps_bw · 0.17mm/px · 1 of 2 frames shown (3 of 7)]
[frame 1/2]
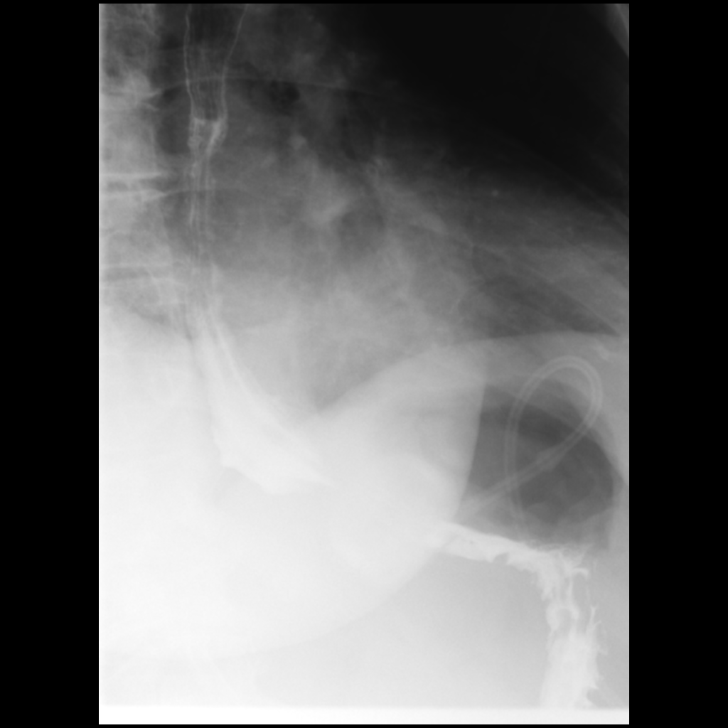

[Series 8: fluoro_barium 2fps_bw · 0.17mm/px · 1 of 2 frames shown (4 of 7)]
[frame 1/2]
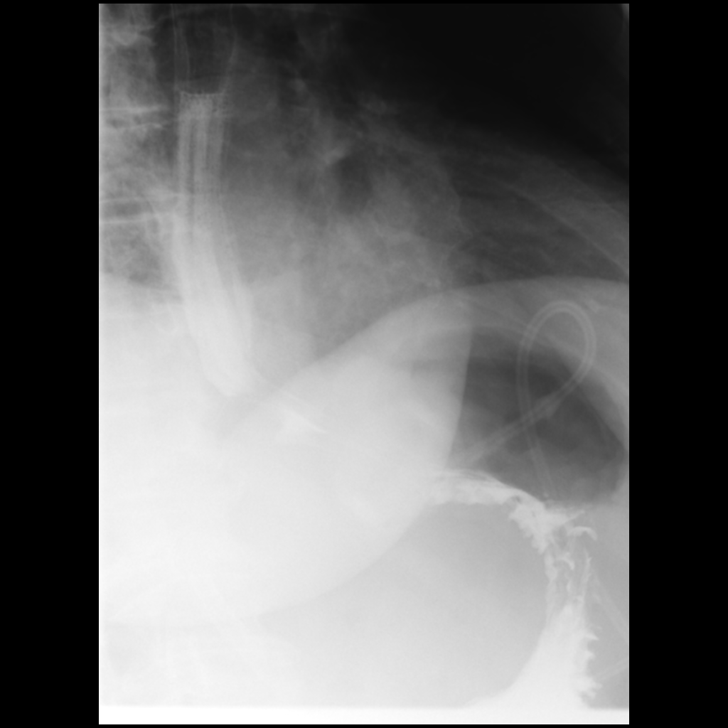

[Series 9: fluoro_barium 2fps_bw · 0.17mm/px · 1 of 1 slices shown (5 of 7)]
[im 1/1]
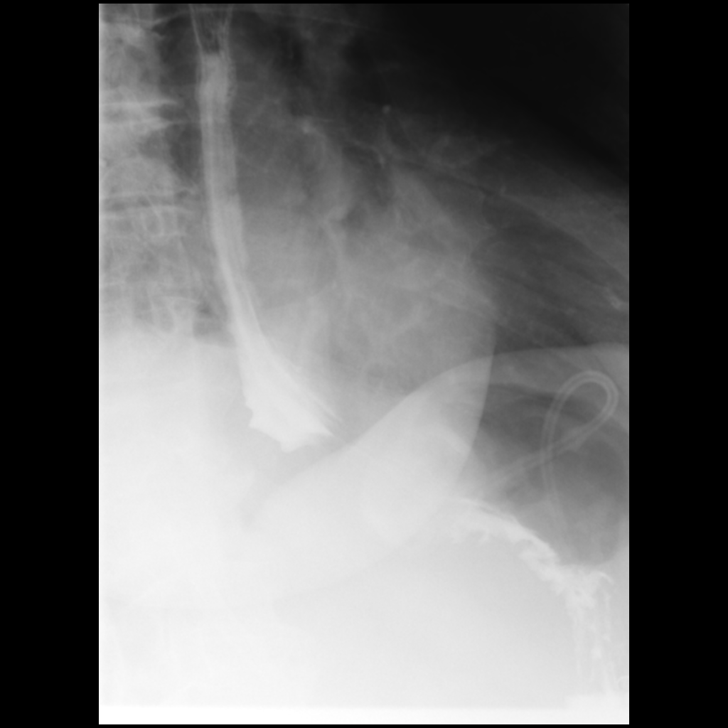

[Series 10: cp_standard · 0.34mm/px · 2 of 181 frames shown (2 of 2)]
[frame 91/181]
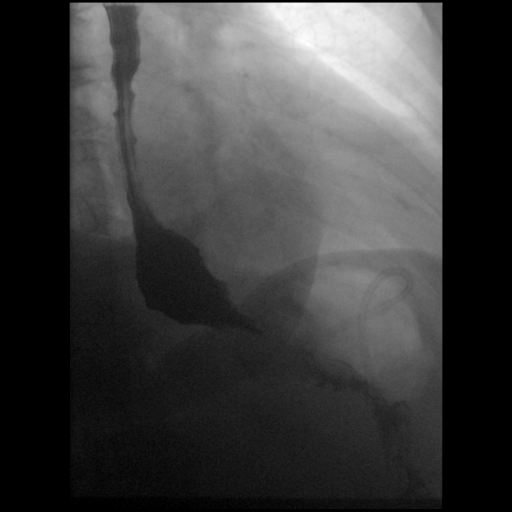
[frame 170/181]
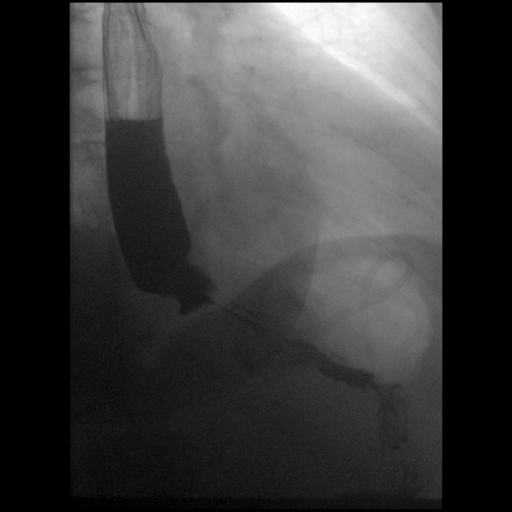

[Series 11: fluoro_barium 2fps_bw · 0.17mm/px · 1 of 2 frames shown (6 of 7)]
[frame 2/2]
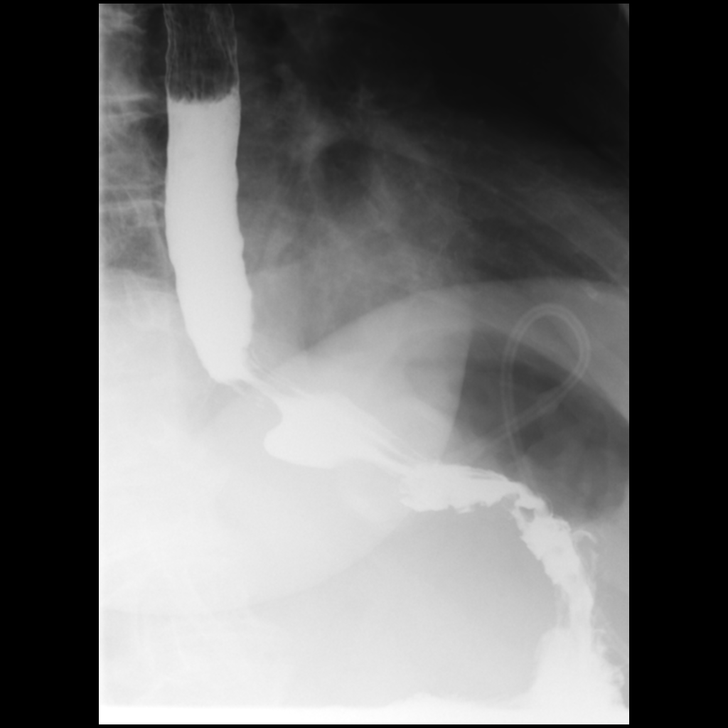

[Series 12: fluoro_barium 2fps_bw · 0.17mm/px · 1 of 2 frames shown (7 of 7)]
[frame 2/2]
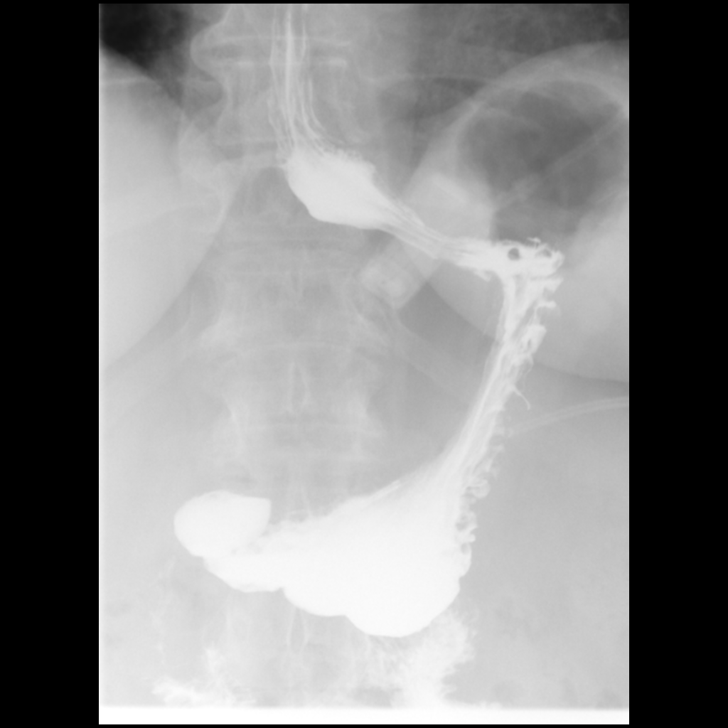

[12 of 24 positions shown; findings below may reference images not displayed]

FINDINGS: Initial KUB demonstrates gastric banding device at the GE junction
oriented in an [DATE] orientation.

Contrast readily throat flows through the gastric banding device at
the GE junction. The esophagus is mildly patulous. No tertiary
contractions. Contrast empties into a normal stomach with normal
duodenum and ligament Treitz in expected location. No mucosal
irregularity identified.

13 mm barium tab passed GE junction
IMPRESSION: Contrast freely flows through the gastric banding device. No
evidence of obstruction.

Mildly patulous esophagus with minimal peristalsis.

## 2022-01-12 ENCOUNTER — Ambulatory Visit: Payer: BC Managed Care – PPO | Attending: Family Medicine | Admitting: Physical Therapy

## 2022-01-12 DIAGNOSIS — R42 Dizziness and giddiness: Secondary | ICD-10-CM | POA: Diagnosis not present

## 2022-01-12 NOTE — Therapy (Signed)
?OUTPATIENT PHYSICAL THERAPY VESTIBULAR EVALUATION ? ? ? ? ?Patient Name: Terri [name] ?MRN: [id number] ?DOB:[birth year], 66 y.o., female ?Today's Date: 01/13/2022 ? ?PCP: Shirlean Mylar, MD ?REFERRING PROVIDER: Darrow Bussing, MD ? ? PT End of Session - 01/13/22 2021   ? ? Visit Number 1   ? PT Start Time 1020   ? PT Stop Time 1100   ? PT Time Calculation (min) 40 min   ? Activity Tolerance Patient tolerated treatment well   ? Behavior During Therapy [facility] for tasks assessed/performed   ? ?  ?  ? ?  ? ? ?Past Medical History:  ?Diagnosis Date  ? ADD (attention deficit disorder)   ? Allergy   ? seasonal  ? Arthritis   ? Asthma   ? mild  ? Blood transfusion without reported diagnosis   ? Cataract   ? Complication of anesthesia   ? can be difficult to wake up/need prompts to breathe  ? GERD (gastroesophageal reflux disease)   ? Barrett's  ? Obesity   ? Pneumonia   ? Sleep apnea   ? Uses c pap, but sleep apnea resolved after surgery  ? ?Past Surgical History:  ?Procedure Laterality Date  ? ABDOMINAL DEBRIDEMENT  05/26/2012  ? ABDOMINOPLASTY  01/2009  ? ADENOIDECTOMY    ? COLONOSCOPY    ? ESOPHAGEAL MANOMETRY N/A 11/08/2019  ? Procedure: ESOPHAGEAL MANOMETRY (EM);  Surgeon: Napoleon Form, MD;  Location: [facility];  Service: Endoscopy;  Laterality: N/A;  ? KNEE ARTHROSCOPY    ? left  ? LAPAROSCOPIC GASTRIC BANDING    ? 05/2004  ? POLYPECTOMY    ? post polyp bleed  ? seroma    ? TONSILLECTOMY    ? TOTAL KNEE ARTHROPLASTY Left 12/02/2020  ? Procedure: TOTAL KNEE ARTHROPLASTY;  Surgeon: Ollen Gross, MD;  Location: [facility];  Service: Orthopedics;  Laterality: Left;   ? UPPER GASTROINTESTINAL ENDOSCOPY    ? Barrett's  ? UVULOPALATOPHARYNGOPLASTY    ? ?Patient Active Problem List  ? Diagnosis Date Noted  ? OA (osteoarthritis) of knee 12/02/2020  ? Osteoarthritis of left knee 12/02/2020  ? Esophageal dysphagia   ? Left leg weakness 04/01/2016  ? Left leg paresthesias 04/01/2016  ? Noninfected postoperative seroma, lower  anterior abdominal wall 04/14/2012  ? History of laparoscopic adjustable gastric banding, 06/16/2004. 12/01/2011  ? GERD 04/15/2010  ? NAUSEA AND VOMITING 04/15/2010  ? DYSPHAGIA 04/15/2010  ? BARIATRIC SURGERY STATUS 04/15/2010  ? ? ?ONSET DATE: Jan. 2023 ? ?REFERRING DIAG: R42 (ICD-10-CM) - Dizziness  ? ?THERAPY DIAG:  ?Dizziness and giddiness ? ?SUBJECTIVE:  ? ?SUBJECTIVE STATEMENT: ?Pt states vertigo started approx. 3-4 months ago - feels it when she stands up; sometimes feels it when walking  ?Pt accompanied by: self ? ?PERTINENT HISTORY: OA ? ? ?PAIN:  ?Are you having pain? No ? ?PRECAUTIONS: None ? ?WEIGHT BEARING RESTRICTIONS No ? ?FALLS: Has patient fallen in last 6 months? No ? ?LIVING ENVIRONMENT: ?Lives with: spouse ?Lives in: House/apartment ? ?PLOF: Independent ? ?PATIENT GOALS try to find out what is causing dizziness ? ?OBJECTIVE:  ? ? ? ?Cervical ROM:  WNL's ? ? ? ? ?GAIT: ?Gait pattern: WFL ? ?VESTIBULAR ASSESSMENT ? ? GENERAL OBSERVATION: Pt reports no true spinning vertigo - states it is not a big problem at this time; has taken awhile to get PT eval since referral was made;  reports occasional unsteadiness during walking - states she will hit the door frame at times when walking ?  ?  SYMPTOM BEHAVIOR: ?  Subjective history: pt reports dizziness started approx. 3-4 months ago ?  Non-Vestibular symptoms:  ?  Type of dizziness: Imbalance (Disequilibrium), Unsteady with head/body turns, and Lightheadedness/Faint ?  Frequency: possibly 2x /wk ?  Duration: nonspecific ?  Aggravating factors: Spontaneous, Worse in the dark, and Worse outside or in busy environment ?  Relieving factors: rest and slow movements ?  Progression of symptoms: unchanged ? ? OCULOMOTOR EXAM: ?  Ocular Alignment: normal ?  Spontaneous Nystagmus: absent ?  Smooth Pursuits: intact ?  Saccades: intact ? ?  Dynamic Visual Acuity: Static: line 10 ?Dynamic: line 8  ?  ? POSITIONAL TESTING: Right Dix-Hallpike: none; Duration:none ?Left  Dix-Hallpike: none; Duration: none ?  ? ? ?FUNCTIONAL GAIT: MCTSIB: Condition 1: Avg of 3 trials: 30 sec, Condition 2: Avg of 3 trials: 30 sec, Condition 3: Avg of 3 trials: 30 sec, Condition 4: Avg of 3 trials: 30 sec, and Total Score: 120/120 ? ? ? ?PATIENT EDUCATION: ?Education details: eval results - no PT warranted at this time as pt declines further balance testing (SOT) ?Person educated: Patient ?Education method: Explanation ?Education comprehension: verbalized understanding ? ? ?GOALS:N/A - eval only ? ?ASSESSMENT: ? ?CLINICAL IMPRESSION: ?Patient is a 66 y.o. lady who was seen today for physical therapy evaluation and treatment for dizziness. All positional testing (-) with no nystagmus and no c/o dizziness with any movements/tests.  Balance is WFL's - pt was offered to participate in further balance/vestibular testing with SOT but pt declines at this time as follow up appt would be needed for this to be done. ? ? ?OBJECTIVE IMPAIRMENTS dizziness.  ? ?ACTIVITY LIMITATIONS  none .  ? ?PERSONAL FACTORS ; none ? ? ?REHAB POTENTIAL: Good ? ?CLINICAL DECISION MAKING: Stable/uncomplicated ? ?EVALUATION COMPLEXITY: Low ? ? ?PLAN: ?PT FREQUENCY:  EVAL only - no treatment indicated at this time as no dizziness and pt reports no balance problems impacting function ? ?PT DURATION: other: N/A - EVAL only ? ?PLANNED INTERVENTIONS: eval only ? ? ?Kary Kos, PT ?01/13/2022, 8:23 PM  ?

## 2022-01-14 DIAGNOSIS — H40002 Preglaucoma, unspecified, left eye: Secondary | ICD-10-CM | POA: Diagnosis not present

## 2022-01-14 DIAGNOSIS — H04123 Dry eye syndrome of bilateral lacrimal glands: Secondary | ICD-10-CM | POA: Diagnosis not present

## 2022-01-14 DIAGNOSIS — H25812 Combined forms of age-related cataract, left eye: Secondary | ICD-10-CM | POA: Diagnosis not present

## 2022-01-14 DIAGNOSIS — H40053 Ocular hypertension, bilateral: Secondary | ICD-10-CM | POA: Diagnosis not present

## 2022-02-05 DIAGNOSIS — A630 Anogenital (venereal) warts: Secondary | ICD-10-CM | POA: Diagnosis not present

## 2022-02-25 ENCOUNTER — Ambulatory Visit: Payer: Self-pay | Admitting: Cardiology

## 2022-02-26 ENCOUNTER — Telehealth: Payer: Self-pay

## 2022-02-26 NOTE — Telephone Encounter (Signed)
NOTES SCANNED TO REFERRAL 

## 2022-03-02 DIAGNOSIS — H25812 Combined forms of age-related cataract, left eye: Secondary | ICD-10-CM | POA: Diagnosis not present

## 2022-03-17 DIAGNOSIS — L245 Irritant contact dermatitis due to other chemical products: Secondary | ICD-10-CM | POA: Diagnosis not present

## 2022-03-17 DIAGNOSIS — A630 Anogenital (venereal) warts: Secondary | ICD-10-CM | POA: Diagnosis not present

## 2022-03-24 ENCOUNTER — Other Ambulatory Visit (HOSPITAL_COMMUNITY)
Admission: RE | Admit: 2022-03-24 | Discharge: 2022-03-24 | Disposition: A | Discharge status: Home / Self Care | Payer: BC Managed Care – PPO | Source: Ambulatory Visit | Attending: Family Medicine | Admitting: Family Medicine

## 2022-03-24 DIAGNOSIS — R140 Abdominal distension (gaseous): Secondary | ICD-10-CM | POA: Diagnosis not present

## 2022-03-24 DIAGNOSIS — Z01419 Encounter for gynecological examination (general) (routine) without abnormal findings: Secondary | ICD-10-CM | POA: Insufficient documentation

## 2022-03-24 DIAGNOSIS — Z0000 Encounter for general adult medical examination without abnormal findings: Secondary | ICD-10-CM | POA: Diagnosis not present

## 2022-03-24 DIAGNOSIS — Z5181 Encounter for therapeutic drug level monitoring: Secondary | ICD-10-CM | POA: Diagnosis not present

## 2022-03-24 DIAGNOSIS — N898 Other specified noninflammatory disorders of vagina: Secondary | ICD-10-CM | POA: Diagnosis not present

## 2022-03-24 DIAGNOSIS — E785 Hyperlipidemia, unspecified: Secondary | ICD-10-CM | POA: Diagnosis not present

## 2022-03-24 DIAGNOSIS — N760 Acute vaginitis: Secondary | ICD-10-CM | POA: Diagnosis not present

## 2022-03-25 ENCOUNTER — Other Ambulatory Visit: Payer: Self-pay | Admitting: Family Medicine

## 2022-03-25 DIAGNOSIS — R140 Abdominal distension (gaseous): Secondary | ICD-10-CM

## 2022-03-27 LAB — CYTOLOGY - PAP
Comment: NEGATIVE
Diagnosis: UNDETERMINED — AB
High risk HPV: NEGATIVE

## 2022-04-01 ENCOUNTER — Ambulatory Visit
Admission: RE | Admit: 2022-04-01 | Discharge: 2022-04-01 | Disposition: A | Discharge status: Home / Self Care | Payer: BC Managed Care – PPO | Source: Ambulatory Visit | Attending: Family Medicine | Admitting: Family Medicine

## 2022-04-01 DIAGNOSIS — R140 Abdominal distension (gaseous): Secondary | ICD-10-CM | POA: Diagnosis not present

## 2022-04-01 DIAGNOSIS — F900 Attention-deficit hyperactivity disorder, predominantly inattentive type: Secondary | ICD-10-CM | POA: Diagnosis not present

## 2022-04-01 DIAGNOSIS — D259 Leiomyoma of uterus, unspecified: Secondary | ICD-10-CM | POA: Diagnosis not present

## 2022-04-01 DIAGNOSIS — F331 Major depressive disorder, recurrent, moderate: Secondary | ICD-10-CM | POA: Diagnosis not present

## 2022-04-01 DIAGNOSIS — F411 Generalized anxiety disorder: Secondary | ICD-10-CM | POA: Diagnosis not present

## 2022-04-01 NOTE — Progress Notes (Signed)
Cardiology Office Note:    Date:  04/03/2022   ID:  Terri [name], DOB 11-14-1955, MRN [id number]  PCP:  Shirlean Mylar, MD  Cardiologist:  Lesleigh Noe, MD   Referring MD: Shirlean Mylar, MD   Chief Complaint  Patient presents with   Advice Only    Shortness of breath Abnormal EKG Vague chest pain    History of Present Illness:    Terri [name] is a 66 y.o. female with a hx of abnormal ECG, dyspnea on exertion, and chest pain at rest.  Has history of obstructive sleep apnea, ADD, GERD, and obesity.   Referred because of abnormal EKG.  Dr.Koirola became concerned about the appearance of her EKG with algorithm reading old anteroseptal infarction.  She has no clinical history of myocardial infarction.  She is a former smoker.  She does have hyperlipidemia.  Father had coronary stents after age 32.  Mother had carotid disease/stroke.  Patient is not diabetic but is overweight.  She does have some dyspnea on exertion but feels it is because of recent physical inactivity.  She does have occasional chest discomfort, it occurs at rest, and has not progressed or changed over time.  Not having chest pain today.  Past Medical History:  Diagnosis Date   ADD (attention deficit disorder)    Allergy    seasonal   Arthritis    Asthma    mild   Blood transfusion without reported diagnosis    Cataract    Complication of anesthesia    can be difficult to wake up/need prompts to breathe   GERD (gastroesophageal reflux disease)    Barrett's   Obesity    Pneumonia    Sleep apnea    Uses c pap, but sleep apnea resolved after surgery    Past Surgical History:  Procedure Laterality Date   ABDOMINAL DEBRIDEMENT  05/26/2012   ABDOMINOPLASTY  01/2009   ADENOIDECTOMY     COLONOSCOPY     ESOPHAGEAL MANOMETRY N/A 11/08/2019   Procedure: ESOPHAGEAL MANOMETRY (EM);  Surgeon: Napoleon Form, MD;  Location: [facility];  Service: Endoscopy;  Laterality: N/A;   KNEE ARTHROSCOPY     left    LAPAROSCOPIC GASTRIC BANDING     05/2004   POLYPECTOMY     post polyp bleed   seroma     TONSILLECTOMY     TOTAL KNEE ARTHROPLASTY Left 12/02/2020   Procedure: TOTAL KNEE ARTHROPLASTY;  Surgeon: Ollen Gross, MD;  Location: [facility];  Service: Orthopedics;  Laterality: Left;    UPPER GASTROINTESTINAL ENDOSCOPY     Barrett's   UVULOPALATOPHARYNGOPLASTY      Current Medications: Current Meds  Medication Sig   ADVAIR DISKUS 250-50 MCG/DOSE AEPB Inhale 1 puff into the lungs 2 (two) times daily.   albuterol (VENTOLIN HFA) 108 (90 Base) MCG/ACT inhaler Inhale 2 puffs into the lungs as needed.   Azelastine HCl 0.15 % SOLN Place 1 spray into the nose daily.   escitalopram (LEXAPRO) 20 MG tablet Take 20 mg by mouth daily.   fluticasone (FLONASE) 50 MCG/ACT nasal spray Place 1-2 sprays into both nostrils daily.   imiquimod (ALDARA) 5 % cream daily in the afternoon.   levalbuterol (XOPENEX HFA) 45 MCG/ACT inhaler Inhale 1-2 puffs into the lungs every 6 (six) hours as needed for shortness of breath or wheezing.   levocetirizine (XYZAL) 5 MG tablet Take 2.5 mg by mouth every evening.   lisdexamfetamine (VYVANSE) 60 MG capsule Take 60 mg by mouth  every morning.   montelukast (SINGULAIR) 10 MG tablet Take 10 mg by mouth every evening.    nystatin cream (MYCOSTATIN) Apply topically.   omeprazole (PRILOSEC) 40 MG capsule Take 1 capsule (40 mg total) by mouth 2 (two) times daily.   Red Yeast Rice 600 MG CAPS Take 2 capsules by mouth daily in the afternoon. Pt takes 1 capsule 600 mg bid with each meal.   zolpidem (AMBIEN) 10 MG tablet Take 10 mg by mouth at bedtime as needed for sleep. For sleep     Allergies:   Patient has no known allergies.   Social History   Socioeconomic History   Marital status: Married    Spouse name: Leonette Most "Chip"   Number of children: 0   Years of education: 16   Highest education level: Not on file  Occupational History   Occupation: Self-employed  Tobacco Use    Smoking status: Former    Types: Cigarettes    Quit date: 11/30/1993    Years since quitting: 28.3   Smokeless tobacco: Never  Vaping Use   Vaping Use: Never used  Substance and Sexual Activity   Alcohol use: Yes    Alcohol/week: 0.0 standard drinks of alcohol    Comment: rarely   Drug use: No   Sexual activity: Not Currently    Birth control/protection: None  Other Topics Concern   Not on file  Social History Narrative   Lives with husband, Chip   Caffeine use: Daily coffee   Social Determinants of Health   Financial Resource Strain: Not on file  Food Insecurity: Not on file  Transportation Needs: Not on file  Physical Activity: Not on file  Stress: Not on file  Social Connections: Not on file     Family History: The patient's family history includes Depression in her mother; Diabetes in her mother; Esophageal cancer in her paternal uncle; Heart Problems in her father; Heart attack in her mother. There is no history of Colon cancer, Stomach cancer, Stroke, Neuropathy, or Rectal cancer.  ROS:   Please see the history of present illness.    Anxiety/ADD.  Physical inactivity lately.  All other systems reviewed and are negative.  EKGs/Labs/Other Studies Reviewed:    The following studies were reviewed today: No prior cardiac studies.  EKG:  EKG in 2016 with LAD and probable LAHB with pseudo anterior MI pattern.  EKG today is consistent with prior tracing from 2016 demonstrating QS pattern V1 and V2.  Otherwise unremarkable.  Recent Labs: No results found for requested labs within last 365 days.  Recent Lipid Panel No results found for: "CHOL", "TRIG", "HDL", "CHOLHDL", "VLDL", "LDLCALC", "LDLDIRECT"  Physical Exam:    VS:  BP 118/84   Pulse 75   Ht 5\' 6"  (1.676 m)   Wt 241 lb 6.4 oz (109.5 kg)   LMP 09/16/2012   SpO2 96%   BMI 38.96 kg/m     Wt Readings from Last 3 Encounters:  04/03/22 241 lb 6.4 oz (109.5 kg)  12/02/20 242 lb (109.8 kg)  10/09/20 243 lb  (110.2 kg)     GEN: Thick neck.. No acute distress HEENT: Normal NECK: No JVD. LYMPHATICS: No lymphadenopathy CARDIAC: No murmur. RRR no gallop, or edema. VASCULAR:  Normal Pulses. No bruits. RESPIRATORY:  Clear to auscultation without rales, wheezing or rhonchi  ABDOMEN: Soft, non-tender, non-distended, No pulsatile mass, MUSCULOSKELETAL: No deformity  SKIN: Warm and dry NEUROLOGIC:  Alert and oriented x 3 PSYCHIATRIC:  Normal affect  ASSESSMENT:    1. Chest pain of uncertain etiology   2. Dyspnea on exertion   3. Nonspecific abnormal electrocardiogram (ECG) (EKG)   4. Hyperlipidemia LDL goal <70   5. Obstructive sleep apnea    PLAN:    In order of problems listed above:  Very atypical.  But with abnormal appearing EKG (chronic and stable for greater than 8 years) we do need to screen for the presence of coronary disease.  Coronary calcium score will be done. 2D Doppler echocardiogram to assess systolic and diastolic function as well as assess right ventricle and determine if pulmonary hypertension is present in this patient with sleep apnea. EKG has a stable anteroseptal infarction pattern since as early as 2016.  2D echo will look at wall motion. Calcium score will help determine how aggressive we should be with lipid management. Continue CPAP   Medication Adjustments/Labs and Tests Ordered: Current medicines are reviewed at length with the patient today.  Concerns regarding medicines are outlined above.  Orders Placed This Encounter  Procedures   CT CARDIAC SCORING (SELF PAY ONLY)   EKG 12-Lead   ECHOCARDIOGRAM COMPLETE   No orders of the defined types were placed in this encounter.   Patient Instructions  Medication Instructions:  Your physician recommends that you continue on your current medications as directed. Please refer to the Current Medication list given to you today.  *If you need a refill on your cardiac medications before your next appointment,  please call your pharmacy*  Lab Work: NONE  Testing/Procedures: Your physician has requested that you have an echocardiogram. Echocardiography is a painless test that uses sound waves to create images of your heart. It provides your doctor with information about the size and shape of your heart and how well your heart's chambers and valves are working. This procedure takes approximately one hour. There are no restrictions for this procedure.  Your physician has recommended you have a coronary calcium score performed.  Follow-Up: At [facility], you and your health needs are our priority.  As part of our continuing mission to provide you with exceptional heart care, we have created designated Provider Care Teams.  These Care Teams include your primary Cardiologist (physician) and Advanced Practice Providers (APPs -  Physician Assistants and Nurse Practitioners) who all work together to provide you with the care you need, when you need it.   Your next appointment:   As needed pending results of echocardiogram and calcium score  The format for your next appointment:   In Person  Provider:   Lesleigh Noe, MD {  Important Information About Sugar         Signed, Lesleigh Noe, MD  04/03/2022 12:39 PM    [facility]

## 2022-04-02 DIAGNOSIS — G4733 Obstructive sleep apnea (adult) (pediatric): Secondary | ICD-10-CM | POA: Diagnosis not present

## 2022-04-03 ENCOUNTER — Ambulatory Visit: Payer: BC Managed Care – PPO | Admitting: Interventional Cardiology

## 2022-04-03 ENCOUNTER — Encounter: Payer: Self-pay | Admitting: Interventional Cardiology

## 2022-04-03 VITALS — BP 118/84 | HR 75 | Ht 66.0 in | Wt 241.4 lb

## 2022-04-03 DIAGNOSIS — R079 Chest pain, unspecified: Secondary | ICD-10-CM | POA: Diagnosis not present

## 2022-04-03 DIAGNOSIS — R9431 Abnormal electrocardiogram [ECG] [EKG]: Secondary | ICD-10-CM

## 2022-04-03 DIAGNOSIS — E785 Hyperlipidemia, unspecified: Secondary | ICD-10-CM

## 2022-04-03 DIAGNOSIS — R0609 Other forms of dyspnea: Secondary | ICD-10-CM

## 2022-04-03 DIAGNOSIS — R1310 Dysphagia, unspecified: Secondary | ICD-10-CM

## 2022-04-03 DIAGNOSIS — G4733 Obstructive sleep apnea (adult) (pediatric): Secondary | ICD-10-CM

## 2022-04-03 NOTE — Patient Instructions (Signed)
Medication Instructions:  Your physician recommends that you continue on your current medications as directed. Please refer to the Current Medication list given to you today.  *If you need a refill on your cardiac medications before your next appointment, please call your pharmacy*  Lab Work: NONE  Testing/Procedures: Your physician has requested that you have an echocardiogram. Echocardiography is a painless test that uses sound waves to create images of your heart. It provides your doctor with information about the size and shape of your heart and how well your heart's chambers and valves are working. This procedure takes approximately one hour. There are no restrictions for this procedure.  Your physician has recommended you have a coronary calcium score performed.  Follow-Up: At [facility], you and your health needs are our priority.  As part of our continuing mission to provide you with exceptional heart care, we have created designated Provider Care Teams.  These Care Teams include your primary Cardiologist (physician) and Advanced Practice Providers (APPs -  Physician Assistants and Nurse Practitioners) who all work together to provide you with the care you need, when you need it.   Your next appointment:   As needed pending results of echocardiogram and calcium score  The format for your next appointment:   In Person  Provider:   Lesleigh Noe, MD {  Important Information About Sugar

## 2022-04-07 DIAGNOSIS — H25812 Combined forms of age-related cataract, left eye: Secondary | ICD-10-CM | POA: Diagnosis not present

## 2022-04-22 ENCOUNTER — Ambulatory Visit (HOSPITAL_BASED_OUTPATIENT_CLINIC_OR_DEPARTMENT_OTHER)
Admission: RE | Admit: 2022-04-22 | Discharge: 2022-04-22 | Disposition: A | Discharge status: Home / Self Care | Payer: BC Managed Care – PPO | Source: Ambulatory Visit | Attending: Interventional Cardiology | Admitting: Interventional Cardiology

## 2022-04-22 ENCOUNTER — Ambulatory Visit (INDEPENDENT_AMBULATORY_CARE_PROVIDER_SITE_OTHER): Payer: BC Managed Care – PPO

## 2022-04-22 DIAGNOSIS — R0609 Other forms of dyspnea: Secondary | ICD-10-CM

## 2022-04-22 DIAGNOSIS — R079 Chest pain, unspecified: Secondary | ICD-10-CM | POA: Insufficient documentation

## 2022-04-24 ENCOUNTER — Telehealth: Payer: Self-pay

## 2022-04-24 ENCOUNTER — Encounter: Payer: Self-pay | Admitting: Interventional Cardiology

## 2022-04-24 DIAGNOSIS — Z79899 Other long term (current) drug therapy: Secondary | ICD-10-CM

## 2022-04-24 DIAGNOSIS — R0609 Other forms of dyspnea: Secondary | ICD-10-CM

## 2022-04-24 DIAGNOSIS — E785 Hyperlipidemia, unspecified: Secondary | ICD-10-CM

## 2022-04-24 MED ORDER — ROSUVASTATIN CALCIUM 20 MG PO TABS: 20.0000 mg | ORAL_TABLET | Freq: Every day | ORAL | 3 refills | Status: DC

## 2022-04-24 NOTE — Telephone Encounter (Signed)
-----   Message from Lyn Records, MD sent at 04/24/2022  8:27 AM EDT ----- Let the patient know the Cor Ca Score is moderately elevated. Aorta is upper normal in size. Last LDL cholesterol was 159. It needs to be < 70. Start Rosuvastatin 20 mg daily. Liver and lipid in 6-8 weeks. Also needs stress Nuclear study. A copy will be sent to Patrcia Dolly, DO

## 2022-04-24 NOTE — Telephone Encounter (Signed)
Spoke with patient and discussed results of calcium score along with Dr. Michaelle Copas recommendations.  Patient agreeable to starting Rosuvastatin 20mg  QD. Rx sent to pharmacy of choice.  Lipid and liver panel ordered and lab appt scheduled for 06/10/22.  Lexiscan Myoview stress test ordered, scheduler to contact patient to setup appt. Instructions for stress test sent to patient via MyChart.  Patient verbalized understanding of the above and expressed appreciation for call.

## 2022-04-27 ENCOUNTER — Other Ambulatory Visit: Payer: Self-pay | Admitting: Interventional Cardiology

## 2022-05-03 DIAGNOSIS — G4733 Obstructive sleep apnea (adult) (pediatric): Secondary | ICD-10-CM | POA: Diagnosis not present

## 2022-05-04 ENCOUNTER — Telehealth: Payer: BC Managed Care – PPO | Admitting: Emergency Medicine

## 2022-05-04 DIAGNOSIS — U071 COVID-19: Secondary | ICD-10-CM | POA: Diagnosis not present

## 2022-05-04 MED ORDER — MOLNUPIRAVIR EUA 200MG CAPSULE: 4.0000 | ORAL_CAPSULE | Freq: Two times a day (BID) | ORAL | 0 refills | Status: AC

## 2022-05-04 MED ORDER — BENZONATATE 100 MG PO CAPS: 100.0000 mg | ORAL_CAPSULE | Freq: Two times a day (BID) | ORAL | 0 refills | Status: DC | PRN

## 2022-05-04 NOTE — Progress Notes (Signed)
Virtual Visit Consent   Terri [name], you are scheduled for a virtual visit with a [facility] provider today. Just as with appointments in the office, your consent must be obtained to participate. Your consent will be active for this visit and any virtual visit you may have with one of our providers in the next 365 days. If you have a MyChart account, a copy of this consent can be sent to you electronically.  As this is a virtual visit, video technology does not allow for your provider to perform a traditional examination. This may limit your provider's ability to fully assess your condition. If your provider identifies any concerns that need to be evaluated in person or the need to arrange testing (such as labs, EKG, etc.), we will make arrangements to do so. Although advances in technology are sophisticated, we cannot ensure that it will always work on either your end or our end. If the connection with a video visit is poor, the visit may have to be switched to a telephone visit. With either a video or telephone visit, we are not always able to ensure that we have a secure connection.  By engaging in this virtual visit, you consent to the provision of healthcare and authorize for your insurance to be billed (if applicable) for the services provided during this visit. Depending on your insurance coverage, you may receive a charge related to this service.  I need to obtain your verbal consent now. Are you willing to proceed with your visit today? Terri [name] has provided verbal consent on 05/04/2022 for a virtual visit (video or telephone). Roxy Horseman, PA-C  Date: 05/04/2022 11:28 AM  Virtual Visit via Video Note   I, Roxy Horseman, connected with  Terri [name]  ([id number], [birth year]) on 05/04/22 at 11:30 AM EDT by a video-enabled telemedicine application and verified that I am speaking with the correct person using two identifiers.  Location: Patient: Virtual Visit Location Patient: Home Provider:  Virtual Visit Location Provider: Home   I discussed the limitations of evaluation and management by telemedicine and the availability of in person appointments. The patient expressed understanding and agreed to proceed.    History of Present Illness: Terri [name] is a 66 y.o. who identifies as a female who was assigned female at birth, and is being seen today for COVID.  She states that her 2 friends just tested positive for COVID.  She is having similar symptoms.  She was in [place] over the weekend at a concert.  Began running headache, having cough, and body aches that started yesterday.  She denies measured fever.  She states that she does have hx of asthma and has been using her inhaler.  HPI: HPI  Problems:  Patient Active Problem List   Diagnosis Date Noted   OA (osteoarthritis) of knee 12/02/2020   Osteoarthritis of left knee 12/02/2020   Esophageal dysphagia    Left leg weakness 04/01/2016   Left leg paresthesias 04/01/2016   Noninfected postoperative seroma, lower anterior abdominal wall 04/14/2012   History of laparoscopic adjustable gastric banding, 06/16/2004. 12/01/2011   GERD 04/15/2010   NAUSEA AND VOMITING 04/15/2010   DYSPHAGIA 04/15/2010   BARIATRIC SURGERY STATUS 04/15/2010    Allergies: No Known Allergies Medications:  Current Outpatient Medications:    ADVAIR DISKUS 250-50 MCG/DOSE AEPB, Inhale 1 puff into the lungs 2 (two) times daily., Disp: , Rfl:    albuterol (VENTOLIN HFA) 108 (90 Base) MCG/ACT inhaler, Inhale 2 puffs into the lungs as  needed., Disp: , Rfl:    Azelastine HCl 0.15 % SOLN, Place 1 spray into the nose daily., Disp: , Rfl:    escitalopram (LEXAPRO) 20 MG tablet, Take 20 mg by mouth daily., Disp: , Rfl:    fluticasone (FLONASE) 50 MCG/ACT nasal spray, Place 1-2 sprays into both nostrils daily., Disp: , Rfl:    imiquimod (ALDARA) 5 % cream, daily in the afternoon., Disp: , Rfl:    levalbuterol (XOPENEX HFA) 45 MCG/ACT inhaler, Inhale 1-2 puffs into  the lungs every 6 (six) hours as needed for shortness of breath or wheezing., Disp: , Rfl:    levocetirizine (XYZAL) 5 MG tablet, Take 2.5 mg by mouth every evening., Disp: , Rfl:    lisdexamfetamine (VYVANSE) 60 MG capsule, Take 60 mg by mouth every morning., Disp: , Rfl:    montelukast (SINGULAIR) 10 MG tablet, Take 10 mg by mouth every evening. , Disp: , Rfl:    nystatin cream (MYCOSTATIN), Apply topically., Disp: , Rfl:    omeprazole (PRILOSEC) 40 MG capsule, Take 1 capsule (40 mg total) by mouth 2 (two) times daily., Disp: 60 capsule, Rfl: 11   Red Yeast Rice 600 MG CAPS, Take 2 capsules by mouth daily in the afternoon. Pt takes 1 capsule 600 mg bid with each meal., Disp: , Rfl:    rosuvastatin (CRESTOR) 20 MG tablet, Take 1 tablet (20 mg total) by mouth daily., Disp: 90 tablet, Rfl: 3   zolpidem (AMBIEN) 10 MG tablet, Take 10 mg by mouth at bedtime as needed for sleep. For sleep, Disp: , Rfl:   Observations/Objective: Patient is well-developed, well-nourished in no acute distress.  Resting comfortably  at home.  Head is normocephalic, atraumatic.  No labored breathing.  Speech is clear and coherent with logical content.  Patient is alert and oriented at baseline.  Dry coughing during visit  Assessment and Plan: There are no diagnoses linked to this encounter.   Follow Up Instructions: I discussed the assessment and treatment plan with the patient. The patient was provided an opportunity to ask questions and all were answered. The patient agreed with the plan and demonstrated an understanding of the instructions.  A copy of instructions were sent to the patient via MyChart unless otherwise noted below.     The patient was advised to call back or seek an in-person evaluation if the symptoms worsen or if the condition fails to improve as anticipated.  Time:  I spent 10 minutes with the patient via telehealth technology discussing the above problems/concerns.    Roxy Horseman,  PA-C

## 2022-05-13 ENCOUNTER — Telehealth (HOSPITAL_COMMUNITY): Payer: Self-pay | Admitting: *Deleted

## 2022-05-13 NOTE — Telephone Encounter (Signed)
Patient given detailed instructions per Myocardial Perfusion Study Information Sheet for the test on 05/18/2022 at 1:00. Patient notified to arrive 15 minutes early and that it is imperative to arrive on time for appointment to keep from having the test rescheduled.  If you need to cancel or reschedule your appointment, please call the office within 24 hours of your appointment. . Patient verbalized understanding.Daneil Dolin

## 2022-05-18 ENCOUNTER — Ambulatory Visit (HOSPITAL_COMMUNITY): Payer: BC Managed Care – PPO | Attending: Cardiology

## 2022-05-18 DIAGNOSIS — R0609 Other forms of dyspnea: Secondary | ICD-10-CM | POA: Diagnosis not present

## 2022-05-18 MED ORDER — TECHNETIUM TC 99M TETROFOSMIN IV KIT
32.9000 | PACK | Freq: Once | INTRAVENOUS | Status: AC | PRN
  Administered 2022-05-18: 32.9 via INTRAVENOUS

## 2022-05-18 MED ORDER — REGADENOSON 0.4 MG/5ML IV SOLN
0.4000 mg | Freq: Once | INTRAVENOUS | Status: AC
  Administered 2022-05-18: 0.4 mg via INTRAVENOUS

## 2022-05-19 ENCOUNTER — Ambulatory Visit (HOSPITAL_COMMUNITY): Payer: BC Managed Care – PPO | Attending: Cardiology

## 2022-05-19 LAB — MYOCARDIAL PERFUSION IMAGING
LV dias vol: 56 mL (ref 46–106)
LV sys vol: 22 mL
Nuc Stress EF: 60 %
Peak HR: 92 {beats}/min
Rest HR: 85 {beats}/min
Rest Nuclear Isotope Dose: 30.5 mCi
SDS: 3
SRS: 0
SSS: 3
ST Depression (mm): 0 mm
Stress Nuclear Isotope Dose: 32.9 mCi
TID: 0.82

## 2022-05-19 MED ORDER — TECHNETIUM TC 99M TETROFOSMIN IV KIT
30.5000 | PACK | Freq: Once | INTRAVENOUS | Status: AC | PRN
  Administered 2022-05-19: 30.5 via INTRAVENOUS

## 2022-06-03 DIAGNOSIS — G4733 Obstructive sleep apnea (adult) (pediatric): Secondary | ICD-10-CM | POA: Diagnosis not present

## 2022-06-10 ENCOUNTER — Ambulatory Visit: Payer: BC Managed Care – PPO | Attending: Interventional Cardiology

## 2022-06-10 DIAGNOSIS — E785 Hyperlipidemia, unspecified: Secondary | ICD-10-CM | POA: Diagnosis not present

## 2022-06-10 DIAGNOSIS — Z79899 Other long term (current) drug therapy: Secondary | ICD-10-CM | POA: Diagnosis not present

## 2022-06-10 LAB — LIPID PANEL
Chol/HDL Ratio: 2.1 ratio (ref 0.0–4.4)
Cholesterol, Total: 159 mg/dL (ref 100–199)
HDL: 75 mg/dL (ref >39)
LDL Chol Calc (NIH): 62 mg/dL (ref 0–99)
Triglycerides: 131 mg/dL (ref 0–149)
VLDL Cholesterol Cal: 22 mg/dL (ref 5–40)

## 2022-06-10 LAB — HEPATIC FUNCTION PANEL
ALT: 28 IU/L (ref 0–32)
AST: 31 IU/L (ref 0–40)
Albumin: 4.3 g/dL (ref 3.9–4.9)
Alkaline Phosphatase: 83 IU/L (ref 44–121)
Bilirubin Total: 0.5 mg/dL (ref 0.0–1.2)
Bilirubin, Direct: 0.17 mg/dL (ref 0.00–0.40)
Total Protein: 6.5 g/dL (ref 6.0–8.5)

## 2022-07-01 DIAGNOSIS — F331 Major depressive disorder, recurrent, moderate: Secondary | ICD-10-CM | POA: Diagnosis not present

## 2022-07-01 DIAGNOSIS — F900 Attention-deficit hyperactivity disorder, predominantly inattentive type: Secondary | ICD-10-CM | POA: Diagnosis not present

## 2022-07-01 DIAGNOSIS — G4733 Obstructive sleep apnea (adult) (pediatric): Secondary | ICD-10-CM | POA: Diagnosis not present

## 2022-07-01 DIAGNOSIS — F411 Generalized anxiety disorder: Secondary | ICD-10-CM | POA: Diagnosis not present

## 2022-08-01 DIAGNOSIS — G4733 Obstructive sleep apnea (adult) (pediatric): Secondary | ICD-10-CM | POA: Diagnosis not present

## 2022-08-03 ENCOUNTER — Other Ambulatory Visit: Payer: Self-pay | Admitting: Family Medicine

## 2022-08-03 DIAGNOSIS — Z1231 Encounter for screening mammogram for malignant neoplasm of breast: Secondary | ICD-10-CM

## 2022-08-17 DIAGNOSIS — J300 Vasomotor rhinitis: Secondary | ICD-10-CM | POA: Diagnosis not present

## 2022-08-17 DIAGNOSIS — J4530 Mild persistent asthma, uncomplicated: Secondary | ICD-10-CM | POA: Diagnosis not present

## 2022-08-17 DIAGNOSIS — K219 Gastro-esophageal reflux disease without esophagitis: Secondary | ICD-10-CM | POA: Diagnosis not present

## 2022-08-27 ENCOUNTER — Encounter: Payer: Self-pay | Admitting: Gastroenterology

## 2022-08-31 DIAGNOSIS — G4733 Obstructive sleep apnea (adult) (pediatric): Secondary | ICD-10-CM | POA: Diagnosis not present

## 2022-09-29 DIAGNOSIS — G4733 Obstructive sleep apnea (adult) (pediatric): Secondary | ICD-10-CM | POA: Diagnosis not present

## 2022-09-30 ENCOUNTER — Ambulatory Visit
Admission: RE | Admit: 2022-09-30 | Discharge: 2022-09-30 | Disposition: A | Discharge status: Home / Self Care | Payer: BC Managed Care – PPO | Source: Ambulatory Visit | Attending: Family Medicine | Admitting: Family Medicine

## 2022-09-30 DIAGNOSIS — F331 Major depressive disorder, recurrent, moderate: Secondary | ICD-10-CM | POA: Diagnosis not present

## 2022-09-30 DIAGNOSIS — Z1231 Encounter for screening mammogram for malignant neoplasm of breast: Secondary | ICD-10-CM

## 2022-09-30 DIAGNOSIS — F900 Attention-deficit hyperactivity disorder, predominantly inattentive type: Secondary | ICD-10-CM | POA: Diagnosis not present

## 2022-09-30 DIAGNOSIS — F411 Generalized anxiety disorder: Secondary | ICD-10-CM | POA: Diagnosis not present

## 2022-10-08 DIAGNOSIS — H6593 Unspecified nonsuppurative otitis media, bilateral: Secondary | ICD-10-CM | POA: Diagnosis not present

## 2022-10-08 DIAGNOSIS — J019 Acute sinusitis, unspecified: Secondary | ICD-10-CM | POA: Diagnosis not present

## 2022-10-08 DIAGNOSIS — R051 Acute cough: Secondary | ICD-10-CM | POA: Diagnosis not present

## 2022-10-08 DIAGNOSIS — B9689 Other specified bacterial agents as the cause of diseases classified elsewhere: Secondary | ICD-10-CM | POA: Diagnosis not present

## 2022-10-15 ENCOUNTER — Other Ambulatory Visit: Payer: Self-pay | Admitting: Family Medicine

## 2022-10-15 ENCOUNTER — Ambulatory Visit
Admission: RE | Admit: 2022-10-15 | Discharge: 2022-10-15 | Disposition: A | Discharge status: Home / Self Care | Payer: BC Managed Care – PPO | Source: Ambulatory Visit | Attending: Family Medicine | Admitting: Family Medicine

## 2022-10-15 DIAGNOSIS — R0602 Shortness of breath: Secondary | ICD-10-CM

## 2022-10-15 DIAGNOSIS — R059 Cough, unspecified: Secondary | ICD-10-CM | POA: Diagnosis not present

## 2022-10-19 DIAGNOSIS — J4531 Mild persistent asthma with (acute) exacerbation: Secondary | ICD-10-CM | POA: Diagnosis not present

## 2022-10-28 DIAGNOSIS — J40 Bronchitis, not specified as acute or chronic: Secondary | ICD-10-CM | POA: Diagnosis not present

## 2022-10-28 DIAGNOSIS — J040 Acute laryngitis: Secondary | ICD-10-CM | POA: Diagnosis not present

## 2022-10-30 DIAGNOSIS — G4733 Obstructive sleep apnea (adult) (pediatric): Secondary | ICD-10-CM | POA: Diagnosis not present

## 2022-11-05 ENCOUNTER — Other Ambulatory Visit (HOSPITAL_COMMUNITY): Payer: Self-pay | Admitting: Family Medicine

## 2022-11-05 DIAGNOSIS — R490 Dysphonia: Secondary | ICD-10-CM

## 2022-11-06 ENCOUNTER — Ambulatory Visit (HOSPITAL_BASED_OUTPATIENT_CLINIC_OR_DEPARTMENT_OTHER)
Admission: RE | Admit: 2022-11-06 | Discharge: 2022-11-06 | Disposition: A | Discharge status: Home / Self Care | Payer: BC Managed Care – PPO | Source: Ambulatory Visit | Attending: Family Medicine | Admitting: Family Medicine

## 2022-11-06 ENCOUNTER — Encounter (HOSPITAL_BASED_OUTPATIENT_CLINIC_OR_DEPARTMENT_OTHER): Payer: Self-pay

## 2022-11-06 DIAGNOSIS — R490 Dysphonia: Secondary | ICD-10-CM | POA: Insufficient documentation

## 2022-11-06 MED ORDER — IOHEXOL 300 MG/ML  SOLN
100.0000 mL | Freq: Once | INTRAMUSCULAR | Status: AC | PRN
  Administered 2022-11-06: 75 mL via INTRAVENOUS

## 2022-11-12 DIAGNOSIS — R490 Dysphonia: Secondary | ICD-10-CM | POA: Diagnosis not present

## 2022-11-12 DIAGNOSIS — I6523 Occlusion and stenosis of bilateral carotid arteries: Secondary | ICD-10-CM | POA: Diagnosis not present

## 2022-11-16 ENCOUNTER — Other Ambulatory Visit: Payer: Self-pay | Admitting: Family Medicine

## 2022-11-16 DIAGNOSIS — I6523 Occlusion and stenosis of bilateral carotid arteries: Secondary | ICD-10-CM

## 2022-11-28 DIAGNOSIS — G4733 Obstructive sleep apnea (adult) (pediatric): Secondary | ICD-10-CM | POA: Diagnosis not present

## 2022-12-08 DIAGNOSIS — R490 Dysphonia: Secondary | ICD-10-CM | POA: Diagnosis not present

## 2022-12-09 ENCOUNTER — Other Ambulatory Visit: Payer: BC Managed Care – PPO

## 2022-12-11 ENCOUNTER — Ambulatory Visit
Admission: RE | Admit: 2022-12-11 | Discharge: 2022-12-11 | Disposition: A | Discharge status: Home / Self Care | Payer: BC Managed Care – PPO | Source: Ambulatory Visit | Attending: Family Medicine | Admitting: Family Medicine

## 2022-12-11 DIAGNOSIS — I6523 Occlusion and stenosis of bilateral carotid arteries: Secondary | ICD-10-CM

## 2022-12-15 ENCOUNTER — Ambulatory Visit: Payer: BC Managed Care – PPO | Attending: Unknown Physician Specialty

## 2022-12-15 DIAGNOSIS — R490 Dysphonia: Secondary | ICD-10-CM | POA: Insufficient documentation

## 2022-12-15 NOTE — Patient Instructions (Addendum)
What to Do Instead of Clearing Your Throat Use these strategies instead of clearing your throat: 1. Swallow your saliva. Swallow as hard as you can.  2. Take a drink of water. 3. Suck on ice chips. 4. Use a silent cough. Whisper the word "huh" from your belly without making a sound and then swallow. This is like a cough but without using your voice. 5. Hum on an "M" and then swallow. 6. Use a light, gentle cough (like tapping your vocal folds together) and then swallow. 7. Silently count to 10 and then swallow       VOICE CONSERVATION PROGRAM  1. Avoid overuse of voice or excessive use of the voice The vocal cords can become easily fatigued. Try to sort out what is "necessary" versus "unnecessary" talking in your environment. You do not need to STOP talking, but try to limit it as much as possible.  Think of resting your voice just as long as you talk. For example, if you talk for 5 minutes, rest your voice completely for 5 minutes. If your voice feels "tired" during the day, try to rest it as much as possible.  Avoid using an excessively loud voice or shouting/raising your voice When you yell or raise your voice, the vocal cords slam into each other, much like a strong hand clap. This causes irritation, and if this irritation continues, hoarseness may increase. If people in your home talk loudly, ask them to reduce the volume of their voices to help you decrease your volume as well. Sit near or face the person to whom you are speaking.   3. Avoid talking over background noise When talking in background noise, speech automatically has increased loudness, and as in number 2 above, continued loud speech can result in increased hoarseness due to irritation to the vocal cords.  Do not talk over the radio or the TV. Mute them before speaking, go to a quieter place to talk, or sit next to the person with whom you are watching.  4. Talk in a voice that is soft, smooth, and gentle This  allows the vocal cords to come together in a gentle way and allows the air being exhaled from the lungs to do most/all of the work when you are speaking.  5. Avoid excessive throat clearing, coughing, and loud laughter During these activities the vocal cords can also be slammed together in a hard way and foster irritation or swelling, which can cause hoarseness. Try taking sips of room temperature/cool water with hard swallows to clear secretions from the throat, instead of throat clearing or coughing. If you absolutely must clear your throat, do so as gently as possible. If you find yourself clearing your throat or coughing a lot, consult your physician. You will want to laugh. Continue to do so, but softly and gently. Do not laugh loudly for long periods of time because this can increase the chances for vocal fold irritation and thus hoarseness. Lozenges can help reduce the need to clear throat/cough during cold/allergy season.  6. Keep the mouth and throat lubricated Drink at least 8-10 8 oz. glasses of water per day (64-80 oz.). This water can come in the form of drink mixes.  Caffeinated beverages such as colas, coffee, and tea (hot tea AND sweet tea) dry out the vocal cords and then can cause irritation and thus hoarseness.  Drinking water will keep your body hydrated. This will help to decrease secretions in the throat, which cause people to  clear their throats or cough. If you are exercising outside (especially during drier weather), try to breathe through your nose as much as possible. If this is not possible, lifting the tongue up behind the upper teeth when breathing through the mouth will add some moisture to the air breathed in past the vocal cords.  7. Avoid mouth breathing - breathe through your nose Your nose serves as a natural filter for dust and dirt particles from the air, and as a humidifier to moisten the air. Vocal cords like to work in moistened, filtered air. When you breathe  through your mouth you lose the air filtering and moistening benefits of breathing through the nose. Be aware of breathing patterns when sitting quietly (e.g., reading or watching TV). Increase your awareness and try to change habits from mouth breathing to nose breathing during those times.  8. Avoid environmental and/or ingested irritants Try to avoid smoke-filled and or dusty environments. These items dry out the vocal cords and cause irritation.  9. Use an air filter if the home is dusty, and/or a humidifier if the air is dry  This will help to maintain clean, humid air to breathe. Remember, this type of air is what the vocal cords like best.

## 2022-12-16 ENCOUNTER — Other Ambulatory Visit: Payer: Self-pay

## 2022-12-16 NOTE — Therapy (Signed)
OUTPATIENT SPEECH LANGUAGE PATHOLOGY VOICE EVALUATION   Patient Name: Terri [name] MRN: [id number] DOB:28-May-1956, 67 y.o., female Today's Date: 12/16/2022  PCP: [name], MD  REFERRING PROVIDER: Beverly Gust, MD   END OF SESSION:  End of Session - 12/16/22 0829     Visit Number 1    Number of Visits 17    Date for SLP Re-Evaluation 02/16/23    SLP Start Time 0804    SLP Stop Time  [id number]    SLP Time Calculation (min) 43 min    Activity Tolerance Patient tolerated treatment well             Past Medical History:  Diagnosis Date   ADD (attention deficit disorder)    Allergy    seasonal   Arthritis    Asthma    mild   Blood transfusion without reported diagnosis    Cataract    Complication of anesthesia    can be difficult to wake up/need prompts to breathe   GERD (gastroesophageal reflux disease)    Barrett's   NAUSEA AND VOMITING 04/15/2010   Obesity    Pneumonia    Sleep apnea    Uses c pap, but sleep apnea resolved after surgery   Past Surgical History:  Procedure Laterality Date   ABDOMINAL DEBRIDEMENT  05/26/2012   ABDOMINOPLASTY  01/2009   ADENOIDECTOMY     COLONOSCOPY     ESOPHAGEAL MANOMETRY N/A 11/08/2019   Procedure: ESOPHAGEAL MANOMETRY (EM);  Surgeon: Mauri Pole, MD;  Location: [facility];  Service: Endoscopy;  Laterality: N/A;   KNEE ARTHROSCOPY     left   LAPAROSCOPIC GASTRIC BANDING     05/2004   POLYPECTOMY     post polyp bleed   seroma     TONSILLECTOMY     TOTAL KNEE ARTHROPLASTY Left 12/02/2020   Procedure: TOTAL KNEE ARTHROPLASTY;  Surgeon: Gaynelle Arabian, MD;  Location: [facility];  Service: Orthopedics;  Laterality: Left;  23min   UPPER GASTROINTESTINAL ENDOSCOPY     Barrett's   UVULOPALATOPHARYNGOPLASTY     Patient Active Problem List   Diagnosis Date Noted   OA (osteoarthritis) of knee 12/02/2020   Osteoarthritis of left knee 12/02/2020   Esophageal dysphagia    Left leg weakness 04/01/2016   Left leg paresthesias  04/01/2016   Noninfected postoperative seroma, lower anterior abdominal wall 04/14/2012   History of laparoscopic adjustable gastric banding, 06/16/2004. 12/01/2011   GERD 04/15/2010   NAUSEA AND VOMITING 04/15/2010   DYSPHAGIA 04/15/2010   BARIATRIC SURGERY STATUS 04/15/2010    Onset date: January 2024  REFERRING DIAG: R49.0 (ICD-10-CM) - Dysphonia   THERAPY DIAG:  Hoarseness  Rationale for Evaluation and Treatment: Rehabilitation  SUBJECTIVE:   SUBJECTIVE STATEMENT: "I lost my voice around Jan 22nd." (Whispering) Pt has been whispering due to "voice rest" suggestion from ENT. Pt accompanied by: self  PERTINENT HISTORY: Pt has hx of abnormal ECG, dyspnea on exertion, and chest pain at rest.  Has history of obstructive sleep apnea, ADD, GERD, and obesity. Hoarseness began after congestion from an upper respiratory illness.ENT eval stated voice is "mod hoarse, breathy, gravelly, loss of voice, raspy, strained, weak. 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   PAIN:  Are you having pain? Yes: NPRS scale: 3/10, intermittent (none at present) Pain location: throat Pain description: sore Aggravating factors: talking  Relieving factors: not talking/voice rest  FALLS: Has patient fallen in last 6 months? No  LIVING ENVIRONMENT: Lives with: lives with their spouse Lives in: House/apartment  PLOF: Independent  PATIENT GOALS: Improve vocal function in order to return fully to work  OBJECTIVE:   DIAGNOSTIC FINDINGS: ENT eval 12/08/22 states "Bilateral VC edema with inflammation, early nodules, using false cords to speak intermittently....scope today looks  like post-infectious cough/trauma. Will treat and have her see speech therapy. Advise voice rest as well."   COGNITION: Overall cognitive status: Within functional limits for tasks assessed  SOCIAL HISTORY: Occupation: See "pertinent history" above Water intake: optimal 3-4 quarts/day Caffeine/alcohol intake: minimal 10 oz/day Daily voice use: moderate and excessive Pt states her voice feels tired by 2-3 pm each day.  PERCEPTUAL VOICE ASSESSMENT: Voice quality: hoarse, breathy, harsh, rough, strained, diplophonia, and aphonic Vocal abuse: habitual throat clearing, abnormal breathing pattern, habitual loudness, and excessive voice use 18 clears (5 after SLP educated about throat clearing alternatives), and 7 coughs Resonance: normal Respiratory function: thoracic breathing and clavicular breathing  OBJECTIVE VOICE ASSESSMENT: Maximum phonation time for sustained "ah": 10.3 Conversational pitch average: 176 Hz Conversational pitch range: 138-220 Hz Conversational loudness average:  78 dB Conversational loudness range:  73-88dB S/z ratio: 1.409 (Suggestive of dysfunction >1.0)  PATIENT REPORTED OUTCOME MEASURES (PROM): V-RQOL: 7.5 (poor - worst possible), with "poor" quality in last two weeks, and VHI: 117 ("severe")  TODAY'S TREATMENT:                                                                                                                                         DATE:  12/15/22: SLP educated pt about what vocal nodules are and how they form, the downward cyclical nature of throat clearing, why pt might feel like she has "bits of cotton" in her throat, and alternatives to throat clearing. SLP discussed program of vocal hygiene, and her focus until next session - limit throat clearing and coughing, and talk in a soft smooth and gentle voice.   PATIENT EDUCATION: Education details: See "Today's Treatment" Person educated: Patient Education method: Explanation, Demonstration,  Verbal cues, and Handouts Education comprehension: verbalized understanding, returned demonstration, verbal cues required, and needs further education  HOME EXERCISE PROGRAM: Vocal hygiene  GOALS: Goals reviewed with patient? Yes  SHORT TERM GOALS: Target date: 01/14/23  Pt will track at least one aspect of vocal hygiene between 5 sessions Baseline: Goal status: INITIAL  2.  Pt will use throat clear alternatives to reduce clears to <5 in 3 sessions Baseline:  Goal status: INITIAL  3.  Pt will demo abdominal breathing (AB) 80% at rest for 5 minutes x2 sessions Baseline:  Goal status: INITIAL  4.  Pt will tell SLP she has used modifications for voice use each day between 3 sessions Baseline:  Goal status: INITIAL  5.  Pt will complete vocal relaxation tasks with rare min A in 3 sessions Baseline:  Goal status: INITIAL   LONG TERM GOALS: Target date: 02/13/23  Pt will score better on VHI and/or VR-QOL than initial score on eval date Baseline:  Goal status: INITIAL  2.  Pt will complete vocal relaxation tasks with modified indpendence in 3 sessions Baseline:  Goal status: INITIAL  3.  Pt will demo abdominal breathing (AB) 80% in sentence responses x2 sessions Baseline:  Goal status: INITIAL  4.  Pt will demo abdominal breathing (AB) 80% in 7 minutes conversation x3 sessions Baseline:  Goal status: INITIAL  5.  Pt will use alternatives to throat clearing and/or demo no more than 2 clears in 3 consecutive sessions Baseline:  Goal status: INITIAL   ASSESSMENT:  CLINICAL IMPRESSION: Patient is a 67 y.o. female who was seen today for assessment of voice. Voice is mod hoarse, rough, gravelly, aphonic, diplophonic, and at a lower than average speaking frequency. She has multiple vocal abusive behaviors and has "pushed" her voice at trade shows and at work since developing hoarseness in January after URI. She would like to return to work full-time and requires her voice to  do this. She has had to decr her work hours due to her voice.   OBJECTIVE IMPAIRMENTS: include voice disorder. These impairments are limiting patient from return to work, household responsibilities, ADLs/IADLs, and effectively communicating at home and in community. Factors affecting potential to achieve goals and functional outcome are severity of impairments.. Patient will benefit from skilled SLP services to address above impairments and improve overall function.  REHAB POTENTIAL: Good  PLAN:  SLP FREQUENCY: 2x/week  SLP DURATION: 8 weeks or 17 total sessions  PLANNED INTERVENTIONS: Environmental controls, Internal/external aids, Functional tasks, SLP instruction and feedback, Compensatory strategies, Patient/family education, and voice exercises PRN    Shereda Graw, CCC-SLP 12/16/2022, 8:30 AM

## 2022-12-21 ENCOUNTER — Ambulatory Visit: Payer: BC Managed Care – PPO

## 2022-12-21 DIAGNOSIS — R490 Dysphonia: Secondary | ICD-10-CM

## 2022-12-21 NOTE — Therapy (Signed)
OUTPATIENT SPEECH LANGUAGE PATHOLOGY VOICE TREATMENT   Patient Name: Terri [name] MRN: [id number] DOB:1956-03-18, 67 y.o., female Today's Date: 12/21/2022  PCP: [name], MD  REFERRING PROVIDER: Beverly Gust, MD   END OF SESSION:  End of Session - 12/21/22 0810     Visit Number 2    Number of Visits 17    Date for SLP Re-Evaluation 02/16/23    SLP Start Time 0806    SLP Stop Time  0845    SLP Time Calculation (min) 39 min    Activity Tolerance Patient tolerated treatment well             Past Medical History:  Diagnosis Date   ADD (attention deficit disorder)    Allergy    seasonal   Arthritis    Asthma    mild   Blood transfusion without reported diagnosis    Cataract    Complication of anesthesia    can be difficult to wake up/need prompts to breathe   GERD (gastroesophageal reflux disease)    Barrett's   NAUSEA AND VOMITING 04/15/2010   Obesity    Pneumonia    Sleep apnea    Uses c pap, but sleep apnea resolved after surgery   Past Surgical History:  Procedure Laterality Date   ABDOMINAL DEBRIDEMENT  05/26/2012   ABDOMINOPLASTY  01/2009   ADENOIDECTOMY     COLONOSCOPY     ESOPHAGEAL MANOMETRY N/A 11/08/2019   Procedure: ESOPHAGEAL MANOMETRY (EM);  Surgeon: Mauri Pole, MD;  Location: [facility];  Service: Endoscopy;  Laterality: N/A;   KNEE ARTHROSCOPY     left   LAPAROSCOPIC GASTRIC BANDING     05/2004   POLYPECTOMY     post polyp bleed   seroma     TONSILLECTOMY     TOTAL KNEE ARTHROPLASTY Left 12/02/2020   Procedure: TOTAL KNEE ARTHROPLASTY;  Surgeon: Gaynelle Arabian, MD;  Location: [facility];  Service: Orthopedics;  Laterality: Left;  60min   UPPER GASTROINTESTINAL ENDOSCOPY     Barrett's   UVULOPALATOPHARYNGOPLASTY     Patient Active Problem List   Diagnosis Date Noted   OA (osteoarthritis) of knee 12/02/2020   Osteoarthritis of left knee 12/02/2020   Esophageal dysphagia    Left leg weakness 04/01/2016   Left leg paresthesias  04/01/2016   Noninfected postoperative seroma, lower anterior abdominal wall 04/14/2012   History of laparoscopic adjustable gastric banding, 06/16/2004. 12/01/2011   GERD 04/15/2010   NAUSEA AND VOMITING 04/15/2010   DYSPHAGIA 04/15/2010   BARIATRIC SURGERY STATUS 04/15/2010    Onset date: January 2024  REFERRING DIAG: R49.0 (ICD-10-CM) - Dysphonia   THERAPY DIAG:  Hoarseness  Rationale for Evaluation and Treatment: Rehabilitation  SUBJECTIVE:   SUBJECTIVE STATEMENT: "Oh yeah I've noticed a difference." (With throat clears) Pt accompanied by: self  PERTINENT HISTORY: Pt has hx of abnormal ECG, dyspnea on exertion, and chest pain at rest.  Has history of obstructive sleep apnea, ADD, GERD, and obesity. Hoarseness began after congestion from an upper respiratory illness.ENT eval stated voice is "mod hoarse, breathy, gravelly, loss of voice, raspy, strained, weak. 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   PAIN:  Are you having pain? No  FALLS: Has patient fallen in last 6 months? No  PATIENT GOALS: Improve vocal function in order to return fully to work  OBJECTIVE:    PATIENT REPORTED OUTCOME MEASURES (PROM): V-RQOL: 7.5 (poor - worst possible), with "poor" quality in last two weeks, and VHI: 117 ("severe")  TODAY'S TREATMENT:                                                                                                                                         DATE:  12/20/32: Pt to learn straw phonation next session in order to decr vocal/laryngeal tension. Pt relates to SLP that she noticed by the end of a ten minute conversation that her "voice  got squeaky." Pt asks SLP about comedian she is seeing Thursday night and a concert she is going to next week and asked SLP about voice hygiene in these areas. She has not cleared throat >4 times since last session, but has compensated with one sharp cough periodically, which she reports "brings on another cough." SLP wonders about vocal cord dysfuction vs. Vocal hypersenstivity. SLP taught pt about abdominal breathing (AB) and pt successful at rest after approx 30 seconds of SLP guidance.   12/15/22: (eval) SLP educated pt about what vocal nodules are and how they form, the downward cyclical nature of throat clearing, why pt might feel like she has "bits of cotton" in her throat, and alternatives to throat clearing. SLP discussed program of vocal hygiene, and her focus until next session - limit throat clearing and coughing, and talk in a soft smooth and gentle voice.   PATIENT EDUCATION: Education details: See "Today's Treatment" Person educated: Patient Education method: Explanation, Demonstration, Verbal cues, and Handouts Education comprehension: verbalized understanding, returned demonstration, verbal cues required, and needs further education  HOME EXERCISE PROGRAM: Vocal hygiene  GOALS: Goals reviewed with patient? Yes  SHORT TERM GOALS: Target date: 01/14/23  Pt will track at least one aspect of vocal hygiene between 5 sessions Baseline: Goal status: Ongoing  2.  Pt will use throat clear alternatives to reduce clears to <5 in 3 sessions Baseline:  Goal status: Ongoing  3.  Pt will demo abdominal breathing (AB) 80% at rest for 5 minutes x2 sessions Baseline:  Goal status: Ongoing  4.  Pt will tell SLP she has used modifications for voice use each day between 3 sessions Baseline:  Goal status: Ongoing  5.  Pt will complete vocal relaxation tasks with rare min A in 3 sessions Baseline:  Goal status: Ongoing   LONG TERM GOALS: Target date: 02/13/23  Pt will score better on  VHI and/or VR-QOL than initial score on eval date Baseline:  Goal status: Ongoing  2.  Pt will complete vocal relaxation tasks with modified indpendence in 3 sessions Baseline:  Goal status: Ongoing  3.  Pt will  demo abdominal breathing (AB) 80% in sentence responses x2 sessions Baseline:  Goal status: Ongoing  4.  Pt will demo abdominal breathing (AB) 80% in 7 minutes conversation x3 sessions Baseline:  Goal status: Ongoing  5.  Pt will use alternatives to throat clearing and/or demo no more than 2 clears in 3 consecutive sessions Baseline:  Goal status: Ongoing   ASSESSMENT:  CLINICAL IMPRESSION: Patient is a 67 y.o. female who was seen today for treatment of voice. Voice is mod hoarse today, and slightly strained. Average speaking frequency remains perceptually low. She has been better about using throat clearing alternatives since last session. In the past she  has used vocally abusive behaviors, and "pushed" her voice at trade shows and at work since developing hoarseness in January after URI. She would like to return to work full-time and requires her voice to do this. She has had to decr her work hours due to her voice.   OBJECTIVE IMPAIRMENTS: include voice disorder. These impairments are limiting patient from return to work, household responsibilities, ADLs/IADLs, and effectively communicating at home and in community. Factors affecting potential to achieve goals and functional outcome are severity of impairments.. Patient will benefit from skilled SLP services to address above impairments and improve overall function.  REHAB POTENTIAL: Good  PLAN:  SLP FREQUENCY: 2x/week  SLP DURATION: 8 weeks or 17 total sessions  PLANNED INTERVENTIONS: Environmental controls, Internal/external aids, Functional tasks, SLP instruction and feedback, Compensatory strategies, Patient/family education, and voice exercises PRN    Akaisha Truman, CCC-SLP 12/21/2022, 8:11 AM

## 2022-12-23 ENCOUNTER — Ambulatory Visit: Payer: BC Managed Care – PPO

## 2022-12-23 DIAGNOSIS — R490 Dysphonia: Secondary | ICD-10-CM | POA: Diagnosis not present

## 2022-12-23 NOTE — Therapy (Signed)
OUTPATIENT SPEECH LANGUAGE PATHOLOGY VOICE TREATMENT   Patient Name: Terri [name] MRN: [id number] DOB:[birth year], 67 y.o., female Today's Date: 12/23/2022  PCP: [name], MD  REFERRING PROVIDER: Beverly Gust, MD   END OF SESSION:  End of Session - 12/23/22 1453     Visit Number 3    Number of Visits 17    Date for SLP Re-Evaluation 02/16/23    SLP Start Time 1407    SLP Stop Time  1448    SLP Time Calculation (min) 41 min    Activity Tolerance Patient tolerated treatment well              Past Medical History:  Diagnosis Date   ADD (attention deficit disorder)    Allergy    seasonal   Arthritis    Asthma    mild   Blood transfusion without reported diagnosis    Cataract    Complication of anesthesia    can be difficult to wake up/need prompts to breathe   GERD (gastroesophageal reflux disease)    Barrett's   NAUSEA AND VOMITING 04/15/2010   Obesity    Pneumonia    Sleep apnea    Uses c pap, but sleep apnea resolved after surgery   Past Surgical History:  Procedure Laterality Date   ABDOMINAL DEBRIDEMENT  05/26/2012   ABDOMINOPLASTY  01/2009   ADENOIDECTOMY     COLONOSCOPY     ESOPHAGEAL MANOMETRY N/A 11/08/2019   Procedure: ESOPHAGEAL MANOMETRY (EM);  Surgeon: Mauri Pole, MD;  Location: [facility];  Service: Endoscopy;  Laterality: N/A;   KNEE ARTHROSCOPY     left   LAPAROSCOPIC GASTRIC BANDING     05/2004   POLYPECTOMY     post polyp bleed   seroma     TONSILLECTOMY     TOTAL KNEE ARTHROPLASTY Left 12/02/2020   Procedure: TOTAL KNEE ARTHROPLASTY;  Surgeon: Gaynelle Arabian, MD;  Location: [facility];  Service: Orthopedics;  Laterality: Left;  74min   UPPER GASTROINTESTINAL ENDOSCOPY     Barrett's   UVULOPALATOPHARYNGOPLASTY     Patient Active Problem List   Diagnosis Date Noted   OA (osteoarthritis) of knee 12/02/2020   Osteoarthritis of left knee 12/02/2020   Esophageal dysphagia    Left leg weakness 04/01/2016   Left leg paresthesias  04/01/2016   Noninfected postoperative seroma, lower anterior abdominal wall 04/14/2012   History of laparoscopic adjustable gastric banding, 06/16/2004. 12/01/2011   GERD 04/15/2010   NAUSEA AND VOMITING 04/15/2010   DYSPHAGIA 04/15/2010   BARIATRIC SURGERY STATUS 04/15/2010    Onset date: January 2024  REFERRING DIAG: R49.0 (ICD-10-CM) - Dysphonia   THERAPY DIAG:  No diagnosis found.  Rationale for Evaluation and Treatment: Rehabilitation  SUBJECTIVE:   SUBJECTIVE STATEMENT: "I have a segment for [facility] that I'm doing tomorrow. It's only 2 1/2 minutes." Pt accompanied by: self  PERTINENT HISTORY: Pt has hx of abnormal ECG, dyspnea on exertion, and chest pain at rest.  Has history of obstructive sleep apnea, ADD, GERD, and obesity. Hoarseness began after congestion from an upper respiratory illness.ENT eval stated voice is "mod hoarse, breathy, gravelly, loss of voice, raspy, strained, weak. Pt had aphonia January 22nd, and left for 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   PAIN:  Are you having pain? Yes. 2/10, throat. Meds alleviate, irritants in the air aggravate.  FALLS: Has patient fallen in last 6 months? No  PATIENT GOALS: Improve vocal function in order to return fully to work  OBJECTIVE:    PATIENT REPORTED OUTCOME MEASURES (PROM): V-RQOL: 7.5 (poor - worst possible), with "poor" quality in last two weeks, and VHI: 117 ("severe")  TODAY'S TREATMENT:                                                                                                                                         DATE:  Abdominal breathing (AB) 12/23/22: Pt enters with breathy, strained voice, lower than  average female pitch, comparable to previous session. SLP had pt perform AB and she req'd mod cues from SLP for abdominal movement and not thoracic movement. Pt did not spontaneously change movement without SLP assistance so thus SLP wonders how successful pt practice has been at home. Pt performed AB again, briefly, shortly after initial practice, correctly. SLP asked pt to hear her WNL voice and SLP was directed to youTube and pt's WNL voice was mildly hoarse, with some vocal fry evident occasionally at ends of utterances. SLP reminded pt four times during session for smooth and gentle voice, and rationale for this. Additionally, pt coughed 5 during session feeling like she needed to clear something from pharynx. SLP encouraged pt to bring water next time as an alternative to coughing. When pt practiced AB, no coughs observed. SLP needs to ensure pt with correct procedure with AB prior to moving into straw exercises.   12/20/32: Pt to learn straw phonation next session in order to decr vocal/laryngeal tension. Pt relates to SLP that she noticed by the end of a ten minute conversation that her "voice got squeaky." Pt asks SLP about comedian she is seeing Thursday night and a concert she is going to next week and asked SLP about voice hygiene in these areas. She has not cleared throat >4 times since last session, but has compensated with one sharp cough periodically, which she reports "brings on another cough." SLP wonders about vocal cord dysfuction vs. Vocal hypersenstivity. SLP taught pt about abdominal breathing (AB) and pt successful at rest after approx 30 seconds of SLP guidance.   12/15/22: (eval) SLP educated pt about what vocal nodules are and how they form, the downward cyclical nature of throat clearing, why pt might feel like she has "bits of cotton" in her throat, and alternatives to throat clearing. SLP discussed program of vocal hygiene, and her focus until next session - limit throat clearing and  coughing, and talk in a soft smooth and gentle voice.   PATIENT EDUCATION: Education details: See "Today's Treatment" Person educated: Patient Education method: Explanation, Demonstration, Verbal cues, and Handouts Education comprehension: verbalized understanding, returned demonstration,  verbal cues required, and needs further education  HOME EXERCISE PROGRAM: Vocal hygiene  GOALS: Goals reviewed with patient? Yes  SHORT TERM GOALS: Target date: 01/14/23  Pt will track at least one aspect of vocal hygiene between 5 sessions Baseline: 12/23/22 Goal status: Ongoing  2.  Pt will use throat clear alternatives to reduce clears to <5 in 3 sessions Baseline:  Goal status: Ongoing  3.  Pt will demo abdominal breathing (AB) 80% at rest for 5 minutes x2 sessions Baseline:  Goal status: Ongoing  4.  Pt will tell SLP she has used modifications for voice use each day between 3 sessions Baseline:  Goal status: Ongoing  5.  Pt will complete vocal relaxation tasks with rare min A in 3 sessions Baseline:  Goal status: Ongoing   LONG TERM GOALS: Target date: 02/13/23  Pt will score better on VHI and/or VR-QOL than initial score on eval date Baseline:  Goal status: Ongoing  2.  Pt will complete vocal relaxation tasks with modified indpendence in 3 sessions Baseline:  Goal status: Ongoing  3.  Pt will demo abdominal breathing (AB) 80% in sentence responses x2 sessions Baseline:  Goal status: Ongoing  4.  Pt will demo abdominal breathing (AB) 80% in 7 minutes conversation x3 sessions Baseline:  Goal status: Ongoing  5.  Pt will use alternatives to throat clearing and/or demo no more than 2 clears in 3 consecutive sessions Baseline:  Goal status: Ongoing   ASSESSMENT:  CLINICAL IMPRESSION: Patient is a 67 y.o. female who was seen today for treatment of voice. Voice is mod hoarse today, and slightly strained. Average speaking frequency remains perceptually low. She is continuing  to perform one cough instead of using non-vocal behavior for throat clear alternatives. 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 She would like to return to work full-time and requires her voice to do this. She has had to decr her work hours due to her voice.   OBJECTIVE IMPAIRMENTS: include voice disorder. These impairments are limiting patient from return to work, household responsibilities, ADLs/IADLs, and effectively communicating at home and in community. Factors affecting potential to achieve goals and functional outcome are severity of impairments.. Patient will benefit from skilled SLP services to address above impairments and improve overall function.  REHAB POTENTIAL: Good  PLAN:  SLP FREQUENCY: 2x/week  SLP DURATION: 8 weeks or 17 total sessions  PLANNED INTERVENTIONS: Environmental controls, Internal/external aids, Functional tasks, SLP instruction and feedback, Compensatory strategies, Patient/family education, and voice exercises PRN    Aalia Greulich, CCC-SLP 12/23/2022, 2:53 PM

## 2022-12-28 ENCOUNTER — Ambulatory Visit: Payer: BC Managed Care – PPO | Attending: Unknown Physician Specialty

## 2022-12-28 DIAGNOSIS — R490 Dysphonia: Secondary | ICD-10-CM | POA: Insufficient documentation

## 2022-12-28 DIAGNOSIS — G4733 Obstructive sleep apnea (adult) (pediatric): Secondary | ICD-10-CM | POA: Diagnosis not present

## 2022-12-28 NOTE — Therapy (Signed)
OUTPATIENT SPEECH LANGUAGE PATHOLOGY VOICE TREATMENT   Patient Name: Terri [name] MRN: [id number] DOB:December 07, 1955, 67 y.o., female Today's Date: 12/28/2022  PCP: [name], MD  REFERRING PROVIDER: Beverly Gust, MD   END OF SESSION:  End of Session - 12/28/22 0807     Visit Number 4    Number of Visits 17    Date for SLP Re-Evaluation 02/16/23    SLP Start Time 0804    SLP Stop Time  0845    SLP Time Calculation (min) 41 min    Activity Tolerance Patient tolerated treatment well              Past Medical History:  Diagnosis Date   ADD (attention deficit disorder)    Allergy    seasonal   Arthritis    Asthma    mild   Blood transfusion without reported diagnosis    Cataract    Complication of anesthesia    can be difficult to wake up/need prompts to breathe   GERD (gastroesophageal reflux disease)    Barrett's   NAUSEA AND VOMITING 04/15/2010   Obesity    Pneumonia    Sleep apnea    Uses c pap, but sleep apnea resolved after surgery   Past Surgical History:  Procedure Laterality Date   ABDOMINAL DEBRIDEMENT  05/26/2012   ABDOMINOPLASTY  01/2009   ADENOIDECTOMY     COLONOSCOPY     ESOPHAGEAL MANOMETRY N/A 11/08/2019   Procedure: ESOPHAGEAL MANOMETRY (EM);  Surgeon: Mauri Pole, MD;  Location: [facility];  Service: Endoscopy;  Laterality: N/A;   KNEE ARTHROSCOPY     left   LAPAROSCOPIC GASTRIC BANDING     05/2004   POLYPECTOMY     post polyp bleed   seroma     TONSILLECTOMY     TOTAL KNEE ARTHROPLASTY Left 12/02/2020   Procedure: TOTAL KNEE ARTHROPLASTY;  Surgeon: Gaynelle Arabian, MD;  Location: [facility];  Service: Orthopedics;  Laterality: Left;  49min   UPPER GASTROINTESTINAL ENDOSCOPY     Barrett's   UVULOPALATOPHARYNGOPLASTY     Patient Active Problem List   Diagnosis Date Noted   OA (osteoarthritis) of knee 12/02/2020   Osteoarthritis of left knee 12/02/2020   Esophageal dysphagia    Left leg weakness 04/01/2016   Left leg paresthesias  04/01/2016   Noninfected postoperative seroma, lower anterior abdominal wall 04/14/2012   History of laparoscopic adjustable gastric banding, 06/16/2004. 12/01/2011   GERD 04/15/2010   NAUSEA AND VOMITING 04/15/2010   DYSPHAGIA 04/15/2010   BARIATRIC SURGERY STATUS 04/15/2010    Onset date: January 2024  REFERRING DIAG: R49.0 (ICD-10-CM) - Dysphonia   THERAPY DIAG:  Hoarseness  Rationale for Evaluation and Treatment: Rehabilitation  SUBJECTIVE:   SUBJECTIVE STATEMENT: "Cured! My cotton ball feeling in my throat is gone with it being relaxed." Pt accompanied by: self  PERTINENT HISTORY: Pt has hx of abnormal ECG, dyspnea on exertion, and chest pain at rest.  Has history of obstructive sleep apnea, ADD, GERD, and obesity. Hoarseness began after congestion from an upper respiratory illness.ENT eval stated voice is "mod hoarse, breathy, gravelly, loss of voice, raspy, strained, weak. 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   PAIN:  Are you having pain? Yes. 2/10, throat. Meds alleviate, irritants in the air aggravate.  FALLS: Has patient fallen in last 6 months? No  PATIENT GOALS: Improve vocal function in order to return fully to work  OBJECTIVE:    PATIENT REPORTED OUTCOME MEASURES (PROM): V-RQOL: 7.5 (poor - worst possible), with "poor" quality in last two weeks, and VHI: 117 ("severe")  TODAY'S TREATMENT:                                                                                                                                         DATE:  Abdominal breathing (AB) 12/28/22: Pt enters with breathy, strained voice, lower than average female  pitch, comparable to previous session. 3 minutes AB resulted in no feeling like Urmi had to cough or throat clear. Immediately after AB completed, pt coughed. SLP wonders about cyclical cough/vocal cord dysfunction as contributing factor to pt's hoarseness, as pt's feeling of discomfort in her throat was alleviated by AB. Pt voice quality also improved to 4/10 from 2-3/10 when walking back to ST room. SLP initiated SOVTE tasks and pt with success blowing into straw, and blowing into water with straw. She was surprised to hear her vocal quality improve further to 5/10 after straw-water blowing. SLP attempted to have pt with flow phonation task of breathe/vocalize into straw but pt with tightness inhibiting vocalization so SLP worked with pt in "vocal waterfalls" ("oooooooo" from high pitch to low pitch) - first 3 were with limited range but the last 3 were with much wider pitch range and better quality. SLP told pt to do waterfalls after straw blowing in water for 5 minutes. She used compensations to limit coughing/throat clearing to 4 during today's session.  12/23/22: Pt enters with breathy, strained voice, lower than average female pitch, comparable to previous session. SLP had pt perform AB and she req'd mod cues from SLP for abdominal movement and not thoracic movement. Pt did not spontaneously change movement without SLP assistance so thus SLP wonders how successful pt practice has been at home. Pt performed AB again, briefly, shortly after initial practice, correctly. SLP asked pt to hear her WNL voice and SLP was directed to youTube and pt's WNL voice was mildly hoarse, with some vocal fry evident occasionally at ends of utterances. SLP reminded pt four times during session for smooth and gentle voice, and rationale for this. Additionally, pt coughed 5 during session feeling like she needed to clear something from pharynx. SLP encouraged pt to bring water next time as an alternative to coughing. When pt  practiced AB, no coughs observed. SLP needs to ensure pt with correct procedure with AB prior to moving into straw exercises.   12/20/32: Pt to learn straw phonation next session in order to decr vocal/laryngeal tension. Pt relates to SLP that she noticed by  the end of a ten minute conversation that her "voice got squeaky." Pt asks SLP about comedian she is seeing Thursday night and a concert she is going to next week and asked SLP about voice hygiene in these areas. She has not cleared throat >4 times since last session, but has compensated with one sharp cough periodically, which she reports "brings on another cough." SLP wonders about vocal cord dysfuction vs. Vocal hypersenstivity. SLP taught pt about abdominal breathing (AB) and pt successful at rest after approx 30 seconds of SLP guidance.   12/15/22: (eval) SLP educated pt about what vocal nodules are and how they form, the downward cyclical nature of throat clearing, why pt might feel like she has "bits of cotton" in her throat, and alternatives to throat clearing. SLP discussed program of vocal hygiene, and her focus until next session - limit throat clearing and coughing, and talk in a soft smooth and gentle voice.   PATIENT EDUCATION: Education details: See "Today's Treatment" Person educated: Patient Education method: Explanation, Demonstration, Verbal cues, and Handouts Education comprehension: verbalized understanding, returned demonstration, verbal cues required, and needs further education  HOME EXERCISE PROGRAM: Vocal hygiene  GOALS: Goals reviewed with patient? Yes  SHORT TERM GOALS: Target date: 01/14/23  Pt will track at least one aspect of vocal hygiene between 5 sessions Baseline: 12/23/22 Goal status: Ongoing  2.  Pt will use throat clear alternatives to reduce clears to <5 in 3 sessions Baseline: 12/28/22 Goal status: Ongoing  3.  Pt will demo abdominal breathing (AB) 80% at rest for 5 minutes x2 sessions Baseline:   Goal status: Ongoing  4.  Pt will tell SLP she has used modifications for voice use each day between 3 sessions Baseline:  Goal status: Ongoing  5.  Pt will complete vocal relaxation tasks with rare min A in 3 sessions Baseline:  Goal status: Ongoing   LONG TERM GOALS: Target date: 02/13/23  Pt will score better on VHI and/or VR-QOL than initial score on eval date Baseline:  Goal status: Ongoing  2.  Pt will complete vocal relaxation tasks with modified indpendence in 3 sessions Baseline:  Goal status: Ongoing  3.  Pt will demo abdominal breathing (AB) 80% in sentence responses x2 sessions Baseline:  Goal status: Ongoing  4.  Pt will demo abdominal breathing (AB) 80% in 7 minutes conversation x3 sessions Baseline:  Goal status: Ongoing  5.  Pt will use alternatives to throat clearing and/or demo no more than 2 clears in 3 consecutive sessions Baseline:  Goal status: Ongoing   ASSESSMENT:  CLINICAL IMPRESSION: Patient is a 67 y.o. female who was seen today for treatment of voice. Voice is mod hoarse today, and slightly strained. Average speaking frequency remains perceptually low. Better vocal quality with SOVTE and flow phonation tasks today. SEE TX NOTE. She is continuing to perform one cough instead of using non-vocal behavior for throat clear alternatives. 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 She would like to return to work full-time and requires her voice to do this. She has had to decr her work hours due to her voice.   OBJECTIVE IMPAIRMENTS: include voice disorder. These impairments are limiting patient from return to work, household responsibilities, ADLs/IADLs, and effectively communicating at home and in community. Factors affecting potential to achieve goals and functional outcome are severity of impairments.. Patient will benefit from skilled SLP  services to address above impairments and improve overall function.  REHAB POTENTIAL: Good  PLAN:  SLP FREQUENCY: 2x/week  SLP DURATION: 8 weeks or 17 total sessions  PLANNED INTERVENTIONS: Environmental controls, Internal/external aids, Functional tasks, SLP instruction and feedback, Compensatory strategies, Patient/family education, and voice exercises PRN    Naima Veldhuizen, CCC-SLP 12/28/2022, 8:08 AM

## 2022-12-29 DIAGNOSIS — G4733 Obstructive sleep apnea (adult) (pediatric): Secondary | ICD-10-CM | POA: Diagnosis not present

## 2022-12-30 ENCOUNTER — Ambulatory Visit: Payer: BC Managed Care – PPO

## 2022-12-30 DIAGNOSIS — F411 Generalized anxiety disorder: Secondary | ICD-10-CM | POA: Diagnosis not present

## 2022-12-30 DIAGNOSIS — F331 Major depressive disorder, recurrent, moderate: Secondary | ICD-10-CM | POA: Diagnosis not present

## 2022-12-30 DIAGNOSIS — F900 Attention-deficit hyperactivity disorder, predominantly inattentive type: Secondary | ICD-10-CM | POA: Diagnosis not present

## 2023-01-04 ENCOUNTER — Ambulatory Visit: Payer: BC Managed Care – PPO

## 2023-01-04 DIAGNOSIS — R490 Dysphonia: Secondary | ICD-10-CM

## 2023-01-04 NOTE — Therapy (Signed)
OUTPATIENT SPEECH LANGUAGE PATHOLOGY VOICE TREATMENT   Patient Name: Terri [name] MRN: [id number] DOB:1956-09-24, 67 y.o., female Today's Date: 01/04/2023  PCP: Shirlean Mylar, MD  REFERRING PROVIDER: Linus Salmons, MD   END OF SESSION:  End of Session - 01/04/23 1822     Visit Number 5    Number of Visits 17    Date for SLP Re-Evaluation 02/16/23    SLP Start Time 1532    SLP Stop Time  1615    SLP Time Calculation (min) 43 min    Activity Tolerance Patient tolerated treatment well              Past Medical History:  Diagnosis Date   ADD (attention deficit disorder)    Allergy    seasonal   Arthritis    Asthma    mild   Blood transfusion without reported diagnosis    Cataract    Complication of anesthesia    can be difficult to wake up/need prompts to breathe   GERD (gastroesophageal reflux disease)    Barrett's   NAUSEA AND VOMITING 04/15/2010   Obesity    Pneumonia    Sleep apnea    Uses c pap, but sleep apnea resolved after surgery   Past Surgical History:  Procedure Laterality Date   ABDOMINAL DEBRIDEMENT  05/26/2012   ABDOMINOPLASTY  01/2009   ADENOIDECTOMY     COLONOSCOPY     ESOPHAGEAL MANOMETRY N/A 11/08/2019   Procedure: ESOPHAGEAL MANOMETRY (EM);  Surgeon: Napoleon Form, MD;  Location: [facility];  Service: Endoscopy;  Laterality: N/A;   KNEE ARTHROSCOPY     left   LAPAROSCOPIC GASTRIC BANDING     05/2004   POLYPECTOMY     post polyp bleed   seroma     TONSILLECTOMY     TOTAL KNEE ARTHROPLASTY Left 12/02/2020   Procedure: TOTAL KNEE ARTHROPLASTY;  Surgeon: Ollen Gross, MD;  Location: [facility];  Service: Orthopedics;  Laterality: Left;    UPPER GASTROINTESTINAL ENDOSCOPY     Barrett's   UVULOPALATOPHARYNGOPLASTY     Patient Active Problem List   Diagnosis Date Noted   OA (osteoarthritis) of knee 12/02/2020   Osteoarthritis of left knee 12/02/2020   Esophageal dysphagia    Left leg weakness 04/01/2016   Left leg paresthesias  04/01/2016   Noninfected postoperative seroma, lower anterior abdominal wall 04/14/2012   History of laparoscopic adjustable gastric banding, 06/16/2004. 12/01/2011   GERD 04/15/2010   NAUSEA AND VOMITING 04/15/2010   DYSPHAGIA 04/15/2010   BARIATRIC SURGERY STATUS 04/15/2010    Onset date: January 2024  REFERRING DIAG: R49.0 (ICD-10-CM) - Dysphonia   THERAPY DIAG:  Hoarseness  Rationale for Evaluation and Treatment: Rehabilitation  SUBJECTIVE:   SUBJECTIVE STATEMENT: "I haven't been talking." Pt accompanied by: self  PERTINENT HISTORY: Pt has hx of abnormal ECG, dyspnea on exertion, and chest pain at rest.  Has history of obstructive sleep apnea, ADD, GERD, and obesity. Hoarseness began after congestion from an upper respiratory illness.ENT eval stated voice is "mod hoarse, breathy, gravelly, loss of voice, raspy, strained, weak. 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  PAIN:  Are you having pain? Yes. 2/10, throat. Meds alleviate, irritants in the air aggravate.  FALLS: Has patient fallen in last 6 months? No  PATIENT GOALS: Improve vocal function in order to return fully to work  OBJECTIVE:    PATIENT REPORTED OUTCOME MEASURES (PROM): V-RQOL: 7.5 (poor - worst possible), with "poor" quality in last two weeks, and VHI: 117 ("severe")  TODAY'S TREATMENT:                                                                                                                                         DATE:  Abdominal breathing (AB), SOVTE (Semi-occluded voice treatment exercises) 01/04/23: Enters today with breathy, mod hoarse voice but less-strained than  previous sessions. Pitch still appears lower than normal based upon videos from fall 2023. Pt has practiced AB for 13-15 minutes BID and has "caught herself" with AB during the day.  SLP attempted pitch glides hi-lo but pt had much difficulty with achieving pitch variation.Marland Kitchen SOVTE was more successful today in water than in previous session, as she was able to feel oral vibrations with vocalization in water after SLP shaped (straw with buzz, then enter water after 3-4 seconds). SLP told pt to perform buzz outside water for 5 minutes, then enter water after 3-4 seconds for 5 minutes.   12/28/22: Pt enters with breathy, strained voice, lower than average female pitch, comparable to previous session. 3 minutes AB resulted in no feeling like Kiari had to cough or throat clear. Immediately after AB completed, pt coughed. SLP wonders about cyclical cough/vocal cord dysfunction as contributing factor to pt's hoarseness, as pt's feeling of discomfort in her throat was alleviated by AB. Pt voice quality also improved to 4/10 from 2-3/10 when walking back to ST room. SLP initiated SOVTE tasks and pt with success blowing into straw, and blowing into water with straw. She was surprised to hear her vocal quality improve further to 5/10 after straw-water blowing. SLP attempted to have pt with flow phonation task of breathe/vocalize into straw but pt with tightness inhibiting vocalization so SLP worked with pt in "vocal waterfalls" ("oooooooo" from high pitch to low pitch) - first 3 were with limited range but the last 3 were with much wider pitch range and better quality. SLP told pt to do waterfalls after straw blowing in water for 5 minutes. She used compensations to limit coughing/throat clearing to 4 during today's session.  12/23/22: Pt enters with breathy, strained voice, lower than average female pitch, comparable to previous session. SLP had pt perform AB and she req'd mod cues from SLP for abdominal movement and not  thoracic movement. Pt did not spontaneously change movement without SLP assistance so thus SLP wonders how successful pt practice has been at home. Pt performed AB again, briefly, shortly after initial practice, correctly. SLP asked pt to hear her WNL voice and SLP was directed to  youTube and pt's WNL voice was mildly hoarse, with some vocal fry evident occasionally at ends of utterances. SLP reminded pt four times during session for smooth and gentle voice, and rationale for this. Additionally, pt coughed 5 during session feeling like she needed to clear something from pharynx. SLP encouraged pt to bring water next time as an alternative to coughing. When pt practiced AB, no coughs observed. SLP needs to ensure pt with correct procedure with AB prior to moving into straw exercises.   12/20/32: Pt to learn straw phonation next session in order to decr vocal/laryngeal tension. Pt relates to SLP that she noticed by the end of a ten minute conversation that her "voice got squeaky." Pt asks SLP about comedian she is seeing Thursday night and a concert she is going to next week and asked SLP about voice hygiene in these areas. She has not cleared throat >4 times since last session, but has compensated with one sharp cough periodically, which she reports "brings on another cough." SLP wonders about vocal cord dysfuction vs. Vocal hypersenstivity. SLP taught pt about abdominal breathing (AB) and pt successful at rest after approx 30 seconds of SLP guidance.   12/15/22: (eval) SLP educated pt about what vocal nodules are and how they form, the downward cyclical nature of throat clearing, why pt might feel like she has "bits of cotton" in her throat, and alternatives to throat clearing. SLP discussed program of vocal hygiene, and her focus until next session - limit throat clearing and coughing, and talk in a soft smooth and gentle voice.   PATIENT EDUCATION: Education details: See "Today's Treatment" Person educated:  Patient Education method: Explanation, Demonstration, Verbal cues, and Handouts Education comprehension: verbalized understanding, returned demonstration, verbal cues required, and needs further education  HOME EXERCISE PROGRAM: Vocal hygiene  GOALS: Goals reviewed with patient? Yes  SHORT TERM GOALS: Target date: 01/14/23  Pt will track at least one aspect of vocal hygiene between 5 sessions Baseline: 12/23/22 Goal status: Ongoing  2.  Pt will use throat clear alternatives to reduce clears to <5 in 3 sessions Baseline: 12/28/22, 01/04/23 Goal status: Ongoing  3.  Pt will demo abdominal breathing (AB) 80% at rest for 5 minutes x2 sessions Baseline: 01/04/23 Goal status: Ongoing  4.  Pt will tell SLP she has used modifications for voice use each day between 3 sessions Baseline: 01/04/23 Goal status: Ongoing  5.  Pt will complete vocal relaxation tasks with rare min A in 3 sessions Baseline:  Goal status: Ongoing   LONG TERM GOALS: Target date: 02/13/23  Pt will score better on VHI and/or VR-QOL than initial score on eval date Baseline:  Goal status: Ongoing  2.  Pt will complete vocal relaxation tasks with modified indpendence in 3 sessions Baseline:  Goal status: Ongoing  3.  Pt will demo abdominal breathing (AB) 80% in sentence responses x2 sessions Baseline:  Goal status: Ongoing  4.  Pt will demo abdominal breathing (AB) 80% in 7 minutes conversation x3 sessions Baseline:  Goal status: Ongoing  5.  Pt will use alternatives to throat clearing and/or demo no more than 2 clears in 3 consecutive sessions Baseline:  Goal status: Ongoing   ASSESSMENT:  CLINICAL IMPRESSION: Patient is a 67 y.o. female who was seen today for treatment of voice. Voice is mod hoarse today, but less strain heard. Average speaking frequency remains perceptually low. Better vocal quality with SOVTE and flow phonation tasks today. SEE TX NOTE. She is continuing to  perform one cough instead of  using non-vocal behavior for throat clear alternatives. 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 She would like to return to work full-time and requires her voice to do this. She has had to decr her work hours due to her voice.   OBJECTIVE IMPAIRMENTS: include voice disorder. These impairments are limiting patient from return to work, household responsibilities, ADLs/IADLs, and effectively communicating at home and in community. Factors affecting potential to achieve goals and functional outcome are severity of impairments.. Patient will benefit from skilled SLP services to address above impairments and improve overall function.  REHAB POTENTIAL: Good  PLAN:  SLP FREQUENCY: 2x/week  SLP DURATION: 8 weeks or 17 total sessions  PLANNED INTERVENTIONS: Environmental controls, Internal/external aids, Functional tasks, SLP instruction and feedback, Compensatory strategies, Patient/family education, and voice exercises PRN    Javone Ybanez, CCC-SLP 01/04/2023, 6:23 PM

## 2023-01-06 ENCOUNTER — Ambulatory Visit: Payer: BC Managed Care – PPO

## 2023-01-06 DIAGNOSIS — R490 Dysphonia: Secondary | ICD-10-CM | POA: Diagnosis not present

## 2023-01-06 NOTE — Therapy (Signed)
OUTPATIENT SPEECH LANGUAGE PATHOLOGY VOICE TREATMENT   Patient Name: Terri [name] MRN: [id number] DOB:06/11/1956, 67 y.o., female Today's Date: 01/06/2023  PCP: Shirlean Mylar, MD  REFERRING PROVIDER: Linus Salmons, MD   END OF SESSION:  End of Session - 01/06/23 0809     Visit Number 6    Number of Visits 17    Date for SLP Re-Evaluation 02/16/23    SLP Start Time 0807    SLP Stop Time  0845    SLP Time Calculation (min) 38 min    Activity Tolerance Patient tolerated treatment well              Past Medical History:  Diagnosis Date   ADD (attention deficit disorder)    Allergy    seasonal   Arthritis    Asthma    mild   Blood transfusion without reported diagnosis    Cataract    Complication of anesthesia    can be difficult to wake up/need prompts to breathe   GERD (gastroesophageal reflux disease)    Barrett's   NAUSEA AND VOMITING 04/15/2010   Obesity    Pneumonia    Sleep apnea    Uses c pap, but sleep apnea resolved after surgery   Past Surgical History:  Procedure Laterality Date   ABDOMINAL DEBRIDEMENT  05/26/2012   ABDOMINOPLASTY  01/2009   ADENOIDECTOMY     COLONOSCOPY     ESOPHAGEAL MANOMETRY N/A 11/08/2019   Procedure: ESOPHAGEAL MANOMETRY (EM);  Surgeon: Napoleon Form, MD;  Location: [facility];  Service: Endoscopy;  Laterality: N/A;   KNEE ARTHROSCOPY     left   LAPAROSCOPIC GASTRIC BANDING     05/2004   POLYPECTOMY     post polyp bleed   seroma     TONSILLECTOMY     TOTAL KNEE ARTHROPLASTY Left 12/02/2020   Procedure: TOTAL KNEE ARTHROPLASTY;  Surgeon: Ollen Gross, MD;  Location: [facility];  Service: Orthopedics;  Laterality: Left;    UPPER GASTROINTESTINAL ENDOSCOPY     Barrett's   UVULOPALATOPHARYNGOPLASTY     Patient Active Problem List   Diagnosis Date Noted   OA (osteoarthritis) of knee 12/02/2020   Osteoarthritis of left knee 12/02/2020   Esophageal dysphagia    Left leg weakness 04/01/2016   Left leg paresthesias  04/01/2016   Noninfected postoperative seroma, lower anterior abdominal wall 04/14/2012   History of laparoscopic adjustable gastric banding, 06/16/2004. 12/01/2011   GERD 04/15/2010   NAUSEA AND VOMITING 04/15/2010   DYSPHAGIA 04/15/2010   BARIATRIC SURGERY STATUS 04/15/2010    Onset date: January 2024  REFERRING DIAG: R49.0 (ICD-10-CM) - Dysphonia   THERAPY DIAG:  Hoarseness  Rationale for Evaluation and Treatment: Rehabilitation  SUBJECTIVE:   SUBJECTIVE STATEMENT: "I haven't been talking." Pt accompanied by: self  PERTINENT HISTORY: Pt has hx of abnormal ECG, dyspnea on exertion, and chest pain at rest.  Has history of obstructive sleep apnea, ADD, GERD, and obesity. Hoarseness began after congestion from an upper respiratory illness.ENT eval stated voice is "mod hoarse, breathy, gravelly, loss of voice, raspy, strained, weak. 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  PAIN:  Are you having pain? Yes. 2/10, throat. Meds alleviate, irritants in the air aggravate.  FALLS: Has patient fallen in last 6 months? No  PATIENT GOALS: Improve vocal function in order to return fully to work  OBJECTIVE:    PATIENT REPORTED OUTCOME MEASURES (PROM): V-RQOL: 7.5 (poor - worst possible), with "poor" quality in last two weeks, and VHI: 117 ("severe")  TODAY'S TREATMENT:                                                                                                                                         DATE:  Abdominal breathing (AB), SOVTE (Semi-occluded voice treatment exercises) 01/06/23: Rates her voice today 5-6/10. Pt has been doing voice rest, is  limiting throat clearing. SLP educated pt on confidential voice today and stressed this throughout session; SLP told pt she should strive to have this level of vocalization when she speaks, if she speaks. SOVTE today with buzz/straw in water with 88% success, req'd initial cues for using abdominal musculature. Pt performed trills with 85% success. Post-trills and straw/buzz in water pt's voice quality improved - pt rated her voice 7/10. Homework for straw/buzz and trills no less than 10 minutes, at least x3/day.  01/04/23: Enters today with breathy, mod hoarse voice but less-strained than previous sessions. Pitch still appears lower than normal based upon videos from fall 2023. Pt has practiced AB for 13-15 minutes BID and has "caught herself" with AB during the day.  SLP attempted pitch glides hi-lo but pt had much difficulty with achieving pitch variation.Marland Kitchen. SOVTE was more successful today in water than in previous session, as she was able to feel oral vibrations with vocalization in water after SLP shaped (straw with buzz, then enter water after 3-4 seconds). SLP told pt to perform buzz outside water for 5 minutes, then enter water after 3-4 seconds for 5 minutes.   12/28/22: Pt enters with breathy, strained voice, lower than average female pitch, comparable to previous session. 3 minutes AB resulted in no feeling like Larita FifeLynn had to cough or throat clear. Immediately after AB completed, pt coughed. SLP wonders about cyclical cough/vocal cord dysfunction as contributing factor to pt's hoarseness, as pt's feeling of discomfort in her throat was alleviated by AB. Pt voice quality also improved to 4/10 from 2-3/10 when walking back to ST room. SLP initiated SOVTE tasks and pt with success blowing into straw, and blowing into water with straw. She was surprised to hear her vocal quality improve further to 5/10 after straw-water blowing. SLP attempted to have pt with flow phonation task of breathe/vocalize into straw  but pt with tightness inhibiting vocalization so SLP worked with pt in "vocal waterfalls" ("oooooooo" from high pitch to low pitch) - first 3 were with limited range but the last 3 were with much wider pitch range and better quality. SLP told pt to do waterfalls after straw blowing  in water for 5 minutes. She used compensations to limit coughing/throat clearing to 4 during today's session.  12/23/22: Pt enters with breathy, strained voice, lower than average female pitch, comparable to previous session. SLP had pt perform AB and she req'd mod cues from SLP for abdominal movement and not thoracic movement. Pt did not spontaneously change movement without SLP assistance so thus SLP wonders how successful pt practice has been at home. Pt performed AB again, briefly, shortly after initial practice, correctly. SLP asked pt to hear her WNL voice and SLP was directed to youTube and pt's WNL voice was mildly hoarse, with some vocal fry evident occasionally at ends of utterances. SLP reminded pt four times during session for smooth and gentle voice, and rationale for this. Additionally, pt coughed 5 during session feeling like she needed to clear something from pharynx. SLP encouraged pt to bring water next time as an alternative to coughing. When pt practiced AB, no coughs observed. SLP needs to ensure pt with correct procedure with AB prior to moving into straw exercises.   12/20/32: Pt to learn straw phonation next session in order to decr vocal/laryngeal tension. Pt relates to SLP that she noticed by the end of a ten minute conversation that her "voice got squeaky." Pt asks SLP about comedian she is seeing Thursday night and a concert she is going to next week and asked SLP about voice hygiene in these areas. She has not cleared throat >4 times since last session, but has compensated with one sharp cough periodically, which she reports "brings on another cough." SLP wonders about vocal cord dysfuction vs. Vocal  hypersenstivity. SLP taught pt about abdominal breathing (AB) and pt successful at rest after approx 30 seconds of SLP guidance.   12/15/22: (eval) SLP educated pt about what vocal nodules are and how they form, the downward cyclical nature of throat clearing, why pt might feel like she has "bits of cotton" in her throat, and alternatives to throat clearing. SLP discussed program of vocal hygiene, and her focus until next session - limit throat clearing and coughing, and talk in a soft smooth and gentle voice.   PATIENT EDUCATION: Education details: See "Today's Treatment" Person educated: Patient Education method: Explanation, Demonstration, Verbal cues, and Handouts Education comprehension: verbalized understanding, returned demonstration, verbal cues required, and needs further education  HOME EXERCISE PROGRAM: Vocal hygiene  GOALS: Goals reviewed with patient? Yes  SHORT TERM GOALS: Target date: 01/14/23  Pt will track at least one aspect of vocal hygiene between 5 sessions Baseline: 12/23/22, 01/06/23 Goal status: Ongoing  2.  Pt will use throat clear alternatives to reduce clears to <5 in 3 sessions Baseline: 12/28/22, 01/04/23 Goal status: Ongoing  3.  Pt will demo abdominal breathing (AB) 80% at rest for 5 minutes x2 sessions Baseline: 01/04/23 Goal status: Ongoing  4.  Pt will tell SLP she has used modifications for voice use each day between 3 sessions Baseline: 01/04/23 Goal status: Ongoing  5.  Pt will complete vocal relaxation tasks with rare min A in 3 sessions Baseline:  Goal status: Ongoing   LONG TERM GOALS: Target date: 02/13/23  Pt will score better on VHI and/or VR-QOL than initial score on eval date Baseline:  Goal status: Ongoing  2.  Pt will complete vocal relaxation tasks with modified indpendence in 3 sessions Baseline:  Goal status: Ongoing  3.  Pt will demo abdominal breathing (AB) 80% in sentence responses x2 sessions Baseline:  Goal status:  Ongoing  4.  Pt will demo abdominal breathing (AB) 80% in 7 minutes conversation x3 sessions Baseline:  Goal status: Ongoing  5.  Pt will use alternatives to throat clearing and/or demo no more than 2 clears in 3 consecutive sessions Baseline:  Goal status: Ongoing   ASSESSMENT:  CLINICAL IMPRESSION: Patient is a 67 y.o. female who was seen today for treatment of voice. Voice is mod hoarse today, but less strain heard. Average speaking frequency remains perceptually low. Better vocal quality with SOVTE and flow phonation tasks today. SEE TX NOTE. She is continuing to perform one cough instead of using non-vocal behavior for throat clear alternatives. 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 She would like to return to work full-time and requires her voice to do this. She has had to decr her work hours due to her voice.   OBJECTIVE IMPAIRMENTS: include voice disorder. These impairments are limiting patient from return to work, household responsibilities, ADLs/IADLs, and effectively communicating at home and in community. Factors affecting potential to achieve goals and functional outcome are severity of impairments.. Patient will benefit from skilled SLP services to address above impairments and improve overall function.  REHAB POTENTIAL: Good  PLAN:  SLP FREQUENCY: 2x/week  SLP DURATION: 8 weeks or 17 total sessions  PLANNED INTERVENTIONS: Environmental controls, Internal/external aids, Functional tasks, SLP instruction and feedback, Compensatory strategies, Patient/family education, and voice exercises PRN    Esparanza Krider, CCC-SLP 01/06/2023, 8:09 AM

## 2023-01-11 ENCOUNTER — Ambulatory Visit: Payer: BC Managed Care – PPO

## 2023-01-11 DIAGNOSIS — R490 Dysphonia: Secondary | ICD-10-CM

## 2023-01-11 NOTE — Therapy (Signed)
OUTPATIENT SPEECH LANGUAGE PATHOLOGY VOICE TREATMENT   Patient Name: Terri [name] MRN: [id number] DOB:11-25-1955, 67 y.o., female Today's Date: 01/11/2023  PCP: Shirlean Mylar, MD  REFERRING PROVIDER: Linus Salmons, MD   END OF SESSION:  End of Session - 01/11/23 0951     Visit Number 7    Number of Visits 17    Date for SLP Re-Evaluation 02/16/23    SLP Start Time 0932    SLP Stop Time  1015    SLP Time Calculation (min) 43 min    Activity Tolerance Patient tolerated treatment well              Past Medical History:  Diagnosis Date   ADD (attention deficit disorder)    Allergy    seasonal   Arthritis    Asthma    mild   Blood transfusion without reported diagnosis    Cataract    Complication of anesthesia    can be difficult to wake up/need prompts to breathe   GERD (gastroesophageal reflux disease)    Barrett's   NAUSEA AND VOMITING 04/15/2010   Obesity    Pneumonia    Sleep apnea    Uses c pap, but sleep apnea resolved after surgery   Past Surgical History:  Procedure Laterality Date   ABDOMINAL DEBRIDEMENT  05/26/2012   ABDOMINOPLASTY  01/2009   ADENOIDECTOMY     COLONOSCOPY     ESOPHAGEAL MANOMETRY N/A 11/08/2019   Procedure: ESOPHAGEAL MANOMETRY (EM);  Surgeon: Napoleon Form, MD;  Location: [facility];  Service: Endoscopy;  Laterality: N/A;   KNEE ARTHROSCOPY     left   LAPAROSCOPIC GASTRIC BANDING     05/2004   POLYPECTOMY     post polyp bleed   seroma     TONSILLECTOMY     TOTAL KNEE ARTHROPLASTY Left 12/02/2020   Procedure: TOTAL KNEE ARTHROPLASTY;  Surgeon: Ollen Gross, MD;  Location: [facility];  Service: Orthopedics;  Laterality: Left;    UPPER GASTROINTESTINAL ENDOSCOPY     Barrett's   UVULOPALATOPHARYNGOPLASTY     Patient Active Problem List   Diagnosis Date Noted   OA (osteoarthritis) of knee 12/02/2020   Osteoarthritis of left knee 12/02/2020   Esophageal dysphagia    Left leg weakness 04/01/2016   Left leg paresthesias  04/01/2016   Noninfected postoperative seroma, lower anterior abdominal wall 04/14/2012   History of laparoscopic adjustable gastric banding, 06/16/2004. 12/01/2011   GERD 04/15/2010   NAUSEA AND VOMITING 04/15/2010   DYSPHAGIA 04/15/2010   BARIATRIC SURGERY STATUS 04/15/2010    Onset date: January 2024  REFERRING DIAG: R49.0 (ICD-10-CM) - Dysphonia   THERAPY DIAG:  Hoarseness  Rationale for Evaluation and Treatment: Rehabilitation  SUBJECTIVE:   SUBJECTIVE STATEMENT: "I feel like it 's better - it is, right?" Pt accompanied by: self  PERTINENT HISTORY: Pt has hx of abnormal ECG, dyspnea on exertion, and chest pain at rest.  Has history of obstructive sleep apnea, ADD, GERD, and obesity. Hoarseness began after congestion from an upper respiratory illness.ENT eval stated voice is "mod hoarse, breathy, gravelly, loss of voice, raspy, strained, weak. 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   PAIN:  Are you having pain? No  FALLS: Has patient fallen in last 6 months? No  PATIENT GOALS: Improve vocal function in order to return fully to work  OBJECTIVE:    PATIENT REPORTED OUTCOME MEASURES (PROM): V-RQOL: 7.5 (poor - worst possible), with "poor" quality in last two weeks, and VHI: 117 ("severe")  TODAY'S TREATMENT:                                                                                                                                         DATE:  Abdominal breathing (AB), SOVTE (Semi-occluded voice treatment exercises) 01/11/23: Pt rates voice today 7/10. SLP hears much less frequent pitch breaks today than in last sessions, however pt's  voice demonstrated fatigue by 38-40 minutes - SLP educated pt she should work for 20 minutes 3-4 times/day with straw buzz in water 15x and then lip trills with pitch variation.  Today when [name] did this (straw water buzz/bubbles x10 followed by lip trills with pitch variation) her voice was WNL - SLP rated 8-9/10.  Tymara arrived with louder voice - SLP reminded her about confidential voice "like putting your voice in a cast" temporarily. She req'd min A throughout the session to remain in confidential voice.   01/06/23: Rates her voice today 5-6/10. Pt has been doing voice rest, is limiting throat clearing. SLP educated pt on confidential voice today and stressed this throughout session; SLP told pt she should strive to have this level of vocalization when she speaks, if she speaks. SOVTE today with buzz/straw in water with 88% success, req'd initial cues for using abdominal musculature. Pt performed trills with 85% success. Post-trills and straw/buzz in water pt's voice quality improved - pt rated her voice 7/10. Homework for straw/buzz and trills no less than 10 minutes, at least x3/day.  01/04/23: Enters today with breathy, mod hoarse voice but less-strained than previous sessions. Pitch still appears lower than normal based upon videos from fall 2023. Pt has practiced AB for 13-15 minutes BID and has "caught herself" with AB during the day.  SLP attempted pitch glides hi-lo but pt had much difficulty with achieving pitch variation.Marland Kitchen SOVTE was more successful today in water than in previous session, as she was able to feel oral vibrations with vocalization in water after SLP shaped (straw with buzz, then enter water after 3-4 seconds). SLP told pt to perform buzz outside water for 5 minutes, then enter water after 3-4 seconds for 5 minutes.   12/28/22: Pt enters with breathy, strained voice, lower than average female pitch, comparable to previous session. 3 minutes AB resulted in no feeling like Irisha had to  cough or throat clear. Immediately after AB completed, pt coughed. SLP wonders about cyclical cough/vocal cord dysfunction as contributing factor to pt's hoarseness, as pt's feeling of discomfort in her throat was alleviated by AB. Pt voice  quality also improved to 4/10 from 2-3/10 when walking back to ST room. SLP initiated SOVTE tasks and pt with success blowing into straw, and blowing into water with straw. She was surprised to hear her vocal quality improve further to 5/10 after straw-water blowing. SLP attempted to have pt with flow phonation task of breathe/vocalize into straw but pt with tightness inhibiting vocalization so SLP worked with pt in "vocal waterfalls" ("oooooooo" from high pitch to low pitch) - first 3 were with limited range but the last 3 were with much wider pitch range and better quality. SLP told pt to do waterfalls after straw blowing in water for 5 minutes. She used compensations to limit coughing/throat clearing to 4 during today's session.  12/23/22: Pt enters with breathy, strained voice, lower than average female pitch, comparable to previous session. SLP had pt perform AB and she req'd mod cues from SLP for abdominal movement and not thoracic movement. Pt did not spontaneously change movement without SLP assistance so thus SLP wonders how successful pt practice has been at home. Pt performed AB again, briefly, shortly after initial practice, correctly. SLP asked pt to hear her WNL voice and SLP was directed to youTube and pt's WNL voice was mildly hoarse, with some vocal fry evident occasionally at ends of utterances. SLP reminded pt four times during session for smooth and gentle voice, and rationale for this. Additionally, pt coughed 5 during session feeling like she needed to clear something from pharynx. SLP encouraged pt to bring water next time as an alternative to coughing. When pt practiced AB, no coughs observed. SLP needs to ensure pt with correct procedure with AB prior  to moving into straw exercises.   12/20/32: Pt to learn straw phonation next session in order to decr vocal/laryngeal tension. Pt relates to SLP that she noticed by the end of a ten minute conversation that her "voice got squeaky." Pt asks SLP about comedian she is seeing Thursday night and a concert she is going to next week and asked SLP about voice hygiene in these areas. She has not cleared throat >4 times since last session, but has compensated with one sharp cough periodically, which she reports "brings on another cough." SLP wonders about vocal cord dysfuction vs. Vocal hypersenstivity. SLP taught pt about abdominal breathing (AB) and pt successful at rest after approx 30 seconds of SLP guidance.   12/15/22: (eval) SLP educated pt about what vocal nodules are and how they form, the downward cyclical nature of throat clearing, why pt might feel like she has "bits of cotton" in her throat, and alternatives to throat clearing. SLP discussed program of vocal hygiene, and her focus until next session - limit throat clearing and coughing, and talk in a soft smooth and gentle voice.   PATIENT EDUCATION: Education details: See "Today's Treatment" Person educated: Patient Education method: Explanation, Demonstration, Verbal cues, and Handouts Education comprehension: verbalized understanding, returned demonstration, verbal cues required, and needs further education  HOME EXERCISE PROGRAM: Vocal hygiene  GOALS: Goals reviewed with patient? Yes  SHORT TERM GOALS: Target date: 01/14/23  Pt will track at least one aspect of vocal hygiene between 5 sessions Baseline: 12/23/22, 01/06/23, 01/11/23 Goal status: Ongoing  2.  Pt will use throat clear alternatives to reduce clears to <5 in 3 sessions Baseline: 12/28/22, 01/04/23 Goal status: Met  3.  Pt will demo abdominal breathing (AB) 80% at rest for 5 minutes x2 sessions Baseline: 01/04/23 Goal status: Met  4.  Pt will  tell SLP she has used modifications  for voice use each day between 3 sessions Baseline: 01/04/23, 01-11-23 Goal status: Ongoing  5.  Pt will complete vocal relaxation tasks with rare min A in 3 sessions Baseline: 01/11/23 Goal status: Ongoing   LONG TERM GOALS: Target date: 02/13/23  Pt will score better on VHI and/or VR-QOL than initial score on eval date Baseline:  Goal status: Ongoing  2.  Pt will complete vocal relaxation tasks with modified indpendence in 3 sessions Baseline:  Goal status: Ongoing  3.  Pt will demo abdominal breathing (AB) 80% in sentence responses x2 sessions Baseline:  Goal status: Ongoing  4.  Pt will demo abdominal breathing (AB) 80% in 7 minutes conversation x3 sessions Baseline:  Goal status: Ongoing  5.  Pt will use alternatives to throat clearing and/or demo no more than 2 clears in 3 consecutive sessions Baseline:  Goal status: Ongoing   ASSESSMENT:  CLINICAL IMPRESSION: Patient is a 67 y.o. female who was seen today for treatment of voice. Voice is min-mod hoarse today, and less frequent strained voice heard - better vocal quality with SOVTE and flow phonation tasks today. SEE TX NOTE. She is continuing to perform one cough instead of using non-vocal behavior for throat clear alternatives. 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 She would like to return to work full-time and requires her voice to do this. She has had to decr her work hours due to her voice.   OBJECTIVE IMPAIRMENTS: include voice disorder. These impairments are limiting patient from return to work, household responsibilities, ADLs/IADLs, and effectively communicating at home and in community. Factors affecting potential to achieve goals and functional outcome are severity of impairments.. Patient will benefit from skilled SLP services to address above impairments and improve overall function.  REHAB POTENTIAL:  Good  PLAN:  SLP FREQUENCY: 2x/week  SLP DURATION: 8 weeks or 17 total sessions  PLANNED INTERVENTIONS: Environmental controls, Internal/external aids, Functional tasks, SLP instruction and feedback, Compensatory strategies, Patient/family education, and voice exercises PRN    Mahsa Hanser, CCC-SLP 01/11/2023, 9:52 AM

## 2023-01-13 ENCOUNTER — Ambulatory Visit: Payer: BC Managed Care – PPO

## 2023-01-13 DIAGNOSIS — R490 Dysphonia: Secondary | ICD-10-CM | POA: Diagnosis not present

## 2023-01-13 NOTE — Therapy (Signed)
OUTPATIENT SPEECH LANGUAGE PATHOLOGY VOICE TREATMENT   Patient Name: Terri [name] MRN: [id number] DOB:[birth year], 67 y.o., female Today's Date: 01/13/2023  PCP: Shirlean Mylar, MD  REFERRING PROVIDER: Linus Salmons, MD   END OF SESSION:  End of Session - 01/13/23 0928     Visit Number 8    Number of Visits 17    Date for SLP Re-Evaluation 02/16/23    SLP Start Time 0932    SLP Stop Time  1015    SLP Time Calculation (min) 43 min    Activity Tolerance Patient tolerated treatment well              Past Medical History:  Diagnosis Date   ADD (attention deficit disorder)    Allergy    seasonal   Arthritis    Asthma    mild   Blood transfusion without reported diagnosis    Cataract    Complication of anesthesia    can be difficult to wake up/need prompts to breathe   GERD (gastroesophageal reflux disease)    Barrett's   NAUSEA AND VOMITING 04/15/2010   Obesity    Pneumonia    Sleep apnea    Uses c pap, but sleep apnea resolved after surgery   Past Surgical History:  Procedure Laterality Date   ABDOMINAL DEBRIDEMENT  05/26/2012   ABDOMINOPLASTY  01/2009   ADENOIDECTOMY     COLONOSCOPY     ESOPHAGEAL MANOMETRY N/A 11/08/2019   Procedure: ESOPHAGEAL MANOMETRY (EM);  Surgeon: Napoleon Form, MD;  Location: [facility];  Service: Endoscopy;  Laterality: N/A;   KNEE ARTHROSCOPY     left   LAPAROSCOPIC GASTRIC BANDING     05/2004   POLYPECTOMY     post polyp bleed   seroma     TONSILLECTOMY     TOTAL KNEE ARTHROPLASTY Left 12/02/2020   Procedure: TOTAL KNEE ARTHROPLASTY;  Surgeon: Ollen Gross, MD;  Location: [facility];  Service: Orthopedics;  Laterality: Left;    UPPER GASTROINTESTINAL ENDOSCOPY     Barrett's   UVULOPALATOPHARYNGOPLASTY     Patient Active Problem List   Diagnosis Date Noted   OA (osteoarthritis) of knee 12/02/2020   Osteoarthritis of left knee 12/02/2020   Esophageal dysphagia    Left leg weakness 04/01/2016   Left leg paresthesias  04/01/2016   Noninfected postoperative seroma, lower anterior abdominal wall 04/14/2012   History of laparoscopic adjustable gastric banding, 06/16/2004. 12/01/2011   GERD 04/15/2010   NAUSEA AND VOMITING 04/15/2010   DYSPHAGIA 04/15/2010   BARIATRIC SURGERY STATUS 04/15/2010    Onset date: January 2024  REFERRING DIAG: R49.0 (ICD-10-CM) - Dysphonia   THERAPY DIAG:  Hoarseness  Rationale for Evaluation and Treatment: Rehabilitation  SUBJECTIVE:   SUBJECTIVE STATEMENT: "Terri [name]'s off today." Pt accompanied by: self  PERTINENT HISTORY: Pt has hx of abnormal ECG, dyspnea on exertion, and chest pain at rest.  Has history of obstructive sleep apnea, ADD, GERD, and obesity. Hoarseness began after congestion from an upper respiratory illness.ENT eval stated voice is "mod hoarse, breathy, gravelly, loss of voice, raspy, strained, weak. 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   PAIN:  Are you having pain? No  FALLS: Has patient fallen in last 6 months? No  PATIENT GOALS: Improve vocal function in order to return fully to work  OBJECTIVE:    PATIENT REPORTED OUTCOME MEASURES (PROM): V-RQOL: 7.5 (poor - worst possible), with "poor" quality in last two weeks, and VHI: 117 ("severe")  TODAY'S TREATMENT:                                                                                                                                         DATE:  Abdominal breathing (AB), SOVTE (Semi-occluded voice treatment exercises) 01/13/23: Terri [name] rates her voice 7/10 today. Pt c/o s/sx VCD today (wanting to throat clear, hoarseness after 10 minutes of conversation). SLP  reminded pt that once in ST when she focused on AB for an extended time this feeling subsided.  Pt practiced for 8 minutes twice yesterday. SLP explained rationale for 15 minutes BID and suggested no < 8 minute practice sessions with water and straw and to complete 4 of these 8-minute sessions if this was her desire (8 minutes).  She and SLP then went outdoors to practice confidential voice and pt maintained softer voice in 12 minutes conversation. Approx 8 minutes into conversation pt remarked that her voice was hoarse and SLP told pt to focus on AB and visualization. No hoarseness heard after that. SLP encouraged pt to cont to perform these techniques. Three throat clears today, SLP had to cue pt for using alternatives to actively clear (tapping vocal folds, gentle "mmm mmm mmm", silent cough, etc)  01/11/23: Pt rates voice today 7/10. SLP hears much less frequent pitch breaks today than in last sessions, however pt's voice demonstrated fatigue by 38-40 minutes - SLP educated pt she should work for 20 minutes 3-4 times/day with straw buzz in water 15x and then lip trills with pitch variation.  Today when Terri [name] did this (straw water buzz/bubbles x10 followed by lip trills with pitch variation) her voice was WNL - SLP rated 8-9/10.  Terri [name] arrived with louder voice - SLP reminded her about confidential voice "like putting your voice in a cast" temporarily. She req'd min A throughout the session to remain in confidential voice.   01/06/23: Rates her voice today 5-6/10. Pt has been doing voice rest, is limiting throat clearing. SLP educated pt on confidential voice today and stressed this throughout session; SLP told pt she should strive to have this level of vocalization when she speaks, if she speaks. SOVTE today with buzz/straw in water with 88% success, req'd initial cues for using abdominal musculature. Pt performed trills with 85% success. Post-trills and straw/buzz in water pt's voice quality improved - pt  rated her voice 7/10. Homework for straw/buzz and trills no less than 10 minutes, at least x3/day.  01/04/23: Enters today with breathy, mod hoarse voice but less-strained than previous sessions. Pitch still appears lower than normal based upon  videos from fall 2023. Pt has practiced AB for 13-15 minutes BID and has "caught herself" with AB during the day.  SLP attempted pitch glides hi-lo but pt had much difficulty with achieving pitch variation.Marland Kitchen SOVTE was more successful today in water than in previous session, as she was able to feel oral vibrations with vocalization in water after SLP shaped (straw with buzz, then enter water after 3-4 seconds). SLP told pt to perform buzz outside water for 5 minutes, then enter water after 3-4 seconds for 5 minutes.   12/28/22: Pt enters with breathy, strained voice, lower than average female pitch, comparable to previous session. 3 minutes AB resulted in no feeling like Terri [name] had to cough or throat clear. Immediately after AB completed, pt coughed. SLP wonders about cyclical cough/vocal cord dysfunction as contributing factor to pt's hoarseness, as pt's feeling of discomfort in her throat was alleviated by AB. Pt voice quality also improved to 4/10 from 2-3/10 when walking back to ST room. SLP initiated SOVTE tasks and pt with success blowing into straw, and blowing into water with straw. She was surprised to hear her vocal quality improve further to 5/10 after straw-water blowing. SLP attempted to have pt with flow phonation task of breathe/vocalize into straw but pt with tightness inhibiting vocalization so SLP worked with pt in "vocal waterfalls" ("oooooooo" from high pitch to low pitch) - first 3 were with limited range but the last 3 were with much wider pitch range and better quality. SLP told pt to do waterfalls after straw blowing in water for 5 minutes. She used compensations to limit coughing/throat clearing to 4 during today's session.  12/23/22: Pt enters with  breathy, strained voice, lower than average female pitch, comparable to previous session. SLP had pt perform AB and she req'd mod cues from SLP for abdominal movement and not thoracic movement. Pt did not spontaneously change movement without SLP assistance so thus SLP wonders how successful pt practice has been at home. Pt performed AB again, briefly, shortly after initial practice, correctly. SLP asked pt to hear her WNL voice and SLP was directed to youTube and pt's WNL voice was mildly hoarse, with some vocal fry evident occasionally at ends of utterances. SLP reminded pt four times during session for smooth and gentle voice, and rationale for this. Additionally, pt coughed 5 during session feeling like she needed to clear something from pharynx. SLP encouraged pt to bring water next time as an alternative to coughing. When pt practiced AB, no coughs observed. SLP needs to ensure pt with correct procedure with AB prior to moving into straw exercises.   12/20/32: Pt to learn straw phonation next session in order to decr vocal/laryngeal tension. Pt relates to SLP that she noticed by the end of a ten minute conversation that her "voice got squeaky." Pt asks SLP about comedian she is seeing Thursday night and a concert she is going to next week and asked SLP about voice hygiene in these areas. She has not cleared throat >4 times since last session, but has compensated with one sharp cough periodically, which she reports "brings on another cough." SLP wonders about vocal cord dysfuction vs. Vocal hypersenstivity. SLP taught pt about abdominal breathing (AB) and pt successful at rest after approx 30 seconds of SLP guidance.   12/15/22: (eval) SLP educated pt about what vocal nodules are and how they form, the downward cyclical nature of throat clearing, why pt might feel like she has "bits of cotton" in her  throat, and alternatives to throat clearing. SLP discussed program of vocal hygiene, and her focus until next  session - limit throat clearing and coughing, and talk in a soft smooth and gentle voice.   PATIENT EDUCATION: Education details: See "Today's Treatment" Person educated: Patient Education method: Explanation, Demonstration, Verbal cues, and Handouts Education comprehension: verbalized understanding, returned demonstration, verbal cues required, and needs further education  HOME EXERCISE PROGRAM: Vocal hygiene  GOALS: Goals reviewed with patient? Yes  SHORT TERM GOALS: Target date: 01/14/23  Pt will track at least one aspect of vocal hygiene between 5 sessions Baseline: 12/23/22, 01/06/23, 01/11/23, 01/13/23 Goal status: Ongoing  2.  Pt will use throat clear alternatives to reduce clears to <5 in 3 sessions Baseline: 12/28/22, 01/04/23 Goal status: Met  3.  Pt will demo abdominal breathing (AB) 80% at rest for 5 minutes x2 sessions Baseline: 01/04/23 Goal status: Met  4.  Pt will tell SLP she has used modifications for voice use each day between 3 sessions Baseline: 01/04/23, 01-11-23 Goal status: Met  5.  Pt will complete vocal relaxation tasks with rare min A in 3 sessions Baseline: 01/11/23, 01/13/23 Goal status: Ongoing   LONG TERM GOALS: Target date: 02/13/23  Pt will score better on VHI and/or VR-QOL than initial score on eval date Baseline:  Goal status: Ongoing  2.  Pt will complete vocal relaxation tasks with modified indpendence in 3 sessions Baseline:  Goal status: Ongoing  3.  Pt will demo abdominal breathing (AB) 80% in sentence responses x2 sessions Baseline:  Goal status: Ongoing  4.  Pt will demo abdominal breathing (AB) 80% in 7 minutes conversation x3 sessions Baseline:  Goal status: Ongoing  5.  Pt will use alternatives to throat clearing and/or demo no more than 2 clears in 3 consecutive sessions Baseline:  Goal status: Ongoing   ASSESSMENT:  CLINICAL IMPRESSION: Patient is a 67 y.o. female who was seen today for treatment of voice. Voice is min-mod  hoarse today, and min frequent strained voice heard today. Cont to work with pt with confidential voice. SEE TX NOTE. 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 She would like to return to work full-time and requires her voice to do this. She has had to decr her work hours due to her voice.   OBJECTIVE IMPAIRMENTS: include voice disorder. These impairments are limiting patient from return to work, household responsibilities, ADLs/IADLs, and effectively communicating at home and in community. Factors affecting potential to achieve goals and functional outcome are severity of impairments.. Patient will benefit from skilled SLP services to address above impairments and improve overall function.  REHAB POTENTIAL: Good  PLAN:  SLP FREQUENCY: 2x/week  SLP DURATION: 8 weeks or 17 total sessions  PLANNED INTERVENTIONS: Environmental controls, Internal/external aids, Functional tasks, SLP instruction and feedback, Compensatory strategies, Patient/family education, and voice exercises PRN    Sharonlee Nine, CCC-SLP 01/13/2023, 9:33 AM

## 2023-01-19 ENCOUNTER — Ambulatory Visit: Payer: BC Managed Care – PPO

## 2023-01-19 DIAGNOSIS — R490 Dysphonia: Secondary | ICD-10-CM

## 2023-01-19 DIAGNOSIS — J382 Nodules of vocal cords: Secondary | ICD-10-CM | POA: Diagnosis not present

## 2023-01-19 NOTE — Patient Instructions (Signed)
Feel your voice relaxed - USE ABDOMINAL BREATH First time through - monotone, second time through OVER inflection [facility] two New shoes News for new shoes My moomoo My moomoo's new shoes Moomoo's news Moomoo's news for the moon Merry moomoo made marmalade My new mom's news Moomoo made the news Marv made the new news Marv and moomoo made the news

## 2023-01-19 NOTE — Therapy (Signed)
OUTPATIENT SPEECH LANGUAGE PATHOLOGY VOICE TREATMENT   Patient Name: Terri [name] MRN: [id number] DOB:07-16-1956, 67 y.o., female Today's Date: 01/19/2023  PCP: Shirlean Mylar, MD  REFERRING PROVIDER: Linus Salmons, MD   END OF SESSION:  End of Session - 01/19/23 1050     Visit Number 9    Number of Visits 17    Date for SLP Re-Evaluation 02/16/23    SLP Start Time 0850    SLP Stop Time  0933    SLP Time Calculation (min) 43 min    Activity Tolerance Patient tolerated treatment well               Past Medical History:  Diagnosis Date   ADD (attention deficit disorder)    Allergy    seasonal   Arthritis    Asthma    mild   Blood transfusion without reported diagnosis    Cataract    Complication of anesthesia    can be difficult to wake up/need prompts to breathe   GERD (gastroesophageal reflux disease)    Barrett's   NAUSEA AND VOMITING 04/15/2010   Obesity    Pneumonia    Sleep apnea    Uses c pap, but sleep apnea resolved after surgery   Past Surgical History:  Procedure Laterality Date   ABDOMINAL DEBRIDEMENT  05/26/2012   ABDOMINOPLASTY  01/2009   ADENOIDECTOMY     COLONOSCOPY     ESOPHAGEAL MANOMETRY N/A 11/08/2019   Procedure: ESOPHAGEAL MANOMETRY (EM);  Surgeon: Napoleon Form, MD;  Location: [facility];  Service: Endoscopy;  Laterality: N/A;   KNEE ARTHROSCOPY     left   LAPAROSCOPIC GASTRIC BANDING     05/2004   POLYPECTOMY     post polyp bleed   seroma     TONSILLECTOMY     TOTAL KNEE ARTHROPLASTY Left 12/02/2020   Procedure: TOTAL KNEE ARTHROPLASTY;  Surgeon: Ollen Gross, MD;  Location: [facility];  Service: Orthopedics;  Laterality: Left;    UPPER GASTROINTESTINAL ENDOSCOPY     Barrett's   UVULOPALATOPHARYNGOPLASTY     Patient Active Problem List   Diagnosis Date Noted   OA (osteoarthritis) of knee 12/02/2020   Osteoarthritis of left knee 12/02/2020   Esophageal dysphagia    Left leg weakness 04/01/2016   Left leg paresthesias  04/01/2016   Noninfected postoperative seroma, lower anterior abdominal wall 04/14/2012   History of laparoscopic adjustable gastric banding, 06/16/2004. 12/01/2011   GERD 04/15/2010   NAUSEA AND VOMITING 04/15/2010   DYSPHAGIA 04/15/2010   BARIATRIC SURGERY STATUS 04/15/2010    Onset date: January 2024  REFERRING DIAG: R49.0 (ICD-10-CM) - Dysphonia   THERAPY DIAG:  Hoarseness  Rationale for Evaluation and Treatment: Rehabilitation  SUBJECTIVE:   SUBJECTIVE STATEMENT: Pt has follow up ENT visit today. Pt accompanied by: self  PERTINENT HISTORY: Pt has hx of abnormal ECG, dyspnea on exertion, and chest pain at rest.  Has history of obstructive sleep apnea, ADD, GERD, and obesity. Hoarseness began after congestion from an upper respiratory illness.ENT eval stated voice is "mod hoarse, breathy, gravelly, loss of voice, raspy, strained, weak. 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   PAIN:  Are you having pain? No  FALLS: Has patient fallen in last 6 months? No  PATIENT GOALS: Improve vocal function in order to return fully to work  OBJECTIVE:    PATIENT REPORTED OUTCOME MEASURES (PROM): V-RQOL: 7.5 (poor - worst possible), with "poor" quality in last two weeks, and VHI: 117 ("severe")  TODAY'S TREATMENT:                                                                                                                                         DATE:  Abdominal breathing (AB), SOVTE (Semi-occluded voice treatment exercises) 01/19/23: Terri [name] arrived speaking with higher volume than confidential voice volume - when she reported on confidential voice she  reduced her volume. Terri [name] benefitted again from the concept that confidential voice is like putting her voice in a cast - she can still use it but at less capacity than WNL. SLP needed to remind Terri [name] x4 during session to reduce volume back to confidential tone.  SLP used flow phonation and SOVTE tasks to have pt feel relaxed voice without strain or tension after 4 minutes of AB practice at rest. SLP educated pt about flow phonation homework - feeling her "relaxed voice" after performing straw vocalization in water. Pt repeated flow phrases (see "pt instructions") with 85-90% WNL voice quality. Terri [name] cleared/coughed x4 today. Terri [name] has begun to think ahead for next week when she will be lead on a photo shoot out of town. She is using her voice amplifier so that she can cont to use softer volume. SLP showed Tonni some alternatives to shouting - clapping, whistling, etc, for which she thanked SLP.  01/13/23: Terri [name] rates her voice 7/10 today. Pt c/o s/sx VCD today (wanting to throat clear, hoarseness after 10 minutes of conversation). SLP reminded pt that once in ST when she focused on AB for an extended time this feeling subsided.  Pt practiced for 8 minutes twice yesterday. SLP explained rationale for 15 minutes BID and suggested no < 8 minute practice sessions with water and straw and to complete 4 of these 8-minute sessions if this was her desire (8 minutes).  She and SLP then went outdoors to practice confidential voice and pt maintained softer voice in 12 minutes conversation. Approx 8 minutes into conversation pt remarked that her voice was hoarse and SLP told pt to focus on AB and visualization. No hoarseness heard after that. SLP encouraged pt to cont to perform these techniques. Three throat clears today, SLP had to cue pt for using alternatives to actively clear (tapping vocal folds, gentle "mmm mmm mmm", silent cough, etc)  01/11/23: Pt rates voice today 7/10. SLP hears much less frequent pitch breaks today  than in last sessions, however pt's voice demonstrated fatigue by 38-40 minutes - SLP educated pt she should work for 20 minutes 3-4 times/day with straw buzz in water 15x  and then lip trills with pitch variation.  Today when Terri [name] did this (straw water buzz/bubbles x10 followed by lip trills with pitch variation) her voice was WNL - SLP rated 8-9/10.  Terri [name] arrived with louder voice - SLP reminded her about confidential voice "like putting your voice in a cast" temporarily. She req'd min A throughout the session to remain in confidential voice.   01/06/23: Rates her voice today 5-6/10. Pt has been doing voice rest, is limiting throat clearing. SLP educated pt on confidential voice today and stressed this throughout session; SLP told pt she should strive to have this level of vocalization when she speaks, if she speaks. SOVTE today with buzz/straw in water with 88% success, req'd initial cues for using abdominal musculature. Pt performed trills with 85% success. Post-trills and straw/buzz in water pt's voice quality improved - pt rated her voice 7/10. Homework for straw/buzz and trills no less than 10 minutes, at least x3/day.  01/04/23: Enters today with breathy, mod hoarse voice but less-strained than previous sessions. Pitch still appears lower than normal based upon videos from fall 2023. Pt has practiced AB for 13-15 minutes BID and has "caught herself" with AB during the day.  SLP attempted pitch glides hi-lo but pt had much difficulty with achieving pitch variation.Marland Kitchen SOVTE was more successful today in water than in previous session, as she was able to feel oral vibrations with vocalization in water after SLP shaped (straw with buzz, then enter water after 3-4 seconds). SLP told pt to perform buzz outside water for 5 minutes, then enter water after 3-4 seconds for 5 minutes.   12/28/22: Pt enters with breathy, strained voice, lower than average female pitch, comparable to previous session. 3 minutes AB  resulted in no feeling like Terri [name] had to cough or throat clear. Immediately after AB completed, pt coughed. SLP wonders about cyclical cough/vocal cord dysfunction as contributing factor to pt's hoarseness, as pt's feeling of discomfort in her throat was alleviated by AB. Pt voice quality also improved to 4/10 from 2-3/10 when walking back to ST room. SLP initiated SOVTE tasks and pt with success blowing into straw, and blowing into water with straw. She was surprised to hear her vocal quality improve further to 5/10 after straw-water blowing. SLP attempted to have pt with flow phonation task of breathe/vocalize into straw but pt with tightness inhibiting vocalization so SLP worked with pt in "vocal waterfalls" ("oooooooo" from high pitch to low pitch) - first 3 were with limited range but the last 3 were with much wider pitch range and better quality. SLP told pt to do waterfalls after straw blowing in water for 5 minutes. She used compensations to limit coughing/throat clearing to 4 during today's session.  12/23/22: Pt enters with breathy, strained voice, lower than average female pitch, comparable to previous session. SLP had pt perform AB and she req'd mod cues from SLP for abdominal movement and not thoracic movement. Pt did not spontaneously change movement without SLP assistance so thus SLP wonders how successful pt practice has been at home. Pt performed AB again, briefly, shortly after initial practice, correctly. SLP asked pt to hear her WNL voice and SLP was directed to youTube and pt's WNL voice was mildly hoarse, with some vocal fry evident occasionally at ends of utterances. SLP reminded pt four times during session for smooth and gentle voice, and rationale for this. Additionally, pt coughed 5 during session feeling like she needed to clear something from pharynx. SLP encouraged pt  to bring water next time as an alternative to coughing. When pt practiced AB, no coughs observed. SLP needs to ensure  pt with correct procedure with AB prior to moving into straw exercises.   12/20/32: Pt to learn straw phonation next session in order to decr vocal/laryngeal tension. Pt relates to SLP that she noticed by the end of a ten minute conversation that her "voice got squeaky." Pt asks SLP about comedian she is seeing Thursday night and a concert she is going to next week and asked SLP about voice hygiene in these areas. She has not cleared throat >4 times since last session, but has compensated with one sharp cough periodically, which she reports "brings on another cough." SLP wonders about vocal cord dysfuction vs. Vocal hypersenstivity. SLP taught pt about abdominal breathing (AB) and pt successful at rest after approx 30 seconds of SLP guidance.   12/15/22: (eval) SLP educated pt about what vocal nodules are and how they form, the downward cyclical nature of throat clearing, why pt might feel like she has "bits of cotton" in her throat, and alternatives to throat clearing. SLP discussed program of vocal hygiene, and her focus until next session - limit throat clearing and coughing, and talk in a soft smooth and gentle voice.   PATIENT EDUCATION: Education details: See "Today's Treatment" Person educated: Patient Education method: Explanation, Demonstration, Verbal cues, and Handouts Education comprehension: verbalized understanding, returned demonstration, verbal cues required, and needs further education  HOME EXERCISE PROGRAM: Vocal hygiene  GOALS: Goals reviewed with patient? Yes  SHORT TERM GOALS: Target date: 01/14/23  Pt will track at least one aspect of vocal hygiene between 5 sessions Baseline: 12/23/22, 01/06/23, 01/11/23, 01/13/23 Goal status: Partially met  2.  Pt will use throat clear alternatives to reduce clears to <5 in 3 sessions Baseline: 12/28/22, 01/04/23 Goal status: Met  3.  Pt will demo abdominal breathing (AB) 80% at rest for 5 minutes x2 sessions Baseline: 01/04/23 Goal status:  Met  4.  Pt will tell SLP she has used modifications for voice use each day between 3 sessions Baseline: 01/04/23, 01-11-23 Goal status: Met  5.  Pt will complete vocal relaxation tasks with rare min A in 3 sessions Baseline: 01/11/23, 01/13/23 Goal status: partially met   LONG TERM GOALS: Target date: 02/13/23  Pt will score better on VHI and/or VR-QOL than initial score on eval date Baseline:  Goal status: Ongoing  2.  Pt will complete vocal relaxation tasks with modified indpendence in 3 sessions Baseline:  Goal status: Ongoing  3.  Pt will demo abdominal breathing (AB) 80% in sentence responses x2 sessions Baseline:  Goal status: Ongoing  4.  Pt will demo abdominal breathing (AB) 80% in 7 minutes conversation x3 sessions Baseline:  Goal status: Ongoing  5.  Pt will use alternatives to throat clearing and/or demo no more than 2 clears in 3 consecutive sessions Baseline:  Goal status: Ongoing   ASSESSMENT:  CLINICAL IMPRESSION: Patient is a 67 y.o. female who was seen today for treatment of voice. Voice is min hoarse today, and min frequent strained voice heard today. Cont to work with pt with confidential voice. SEE TX NOTE. 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 She would like to return to work full-time and requires her voice to do this. She has had to decr her work hours due to her voice.  OBJECTIVE IMPAIRMENTS: include voice disorder. These impairments are limiting patient from return to work, household responsibilities, ADLs/IADLs, and effectively communicating at home and in community. Factors affecting potential to achieve goals and functional outcome are severity of impairments.. Patient will benefit from skilled SLP services to address above impairments and improve overall function.  REHAB POTENTIAL: Good  PLAN:  SLP FREQUENCY: 2x/week  SLP DURATION:  8 weeks or 17 total sessions  PLANNED INTERVENTIONS: Environmental controls, Internal/external aids, Functional tasks, SLP instruction and feedback, Compensatory strategies, Patient/family education, and voice exercises PRN    Tehani Mersman, CCC-SLP 01/19/2023, 10:50 AM

## 2023-01-21 ENCOUNTER — Ambulatory Visit: Payer: BC Managed Care – PPO

## 2023-01-21 DIAGNOSIS — R490 Dysphonia: Secondary | ICD-10-CM | POA: Diagnosis not present

## 2023-01-21 NOTE — Therapy (Signed)
OUTPATIENT SPEECH LANGUAGE PATHOLOGY VOICE TREATMENT   Patient Name: Terri [name] MRN: [id number] DOB:1956/03/14, 67 y.o., female Today's Date: 01/21/2023  PCP: Terri Mylar, MD  REFERRING PROVIDER: Linus Salmons, MD   END OF SESSION:  End of Session - 01/21/23 1310     Visit Number 10    Number of Visits 17    Date for SLP Re-Evaluation 02/16/23    SLP Start Time 0804    SLP Stop Time  0845    SLP Time Calculation (min) 41 min    Activity Tolerance Patient tolerated treatment well               Past Medical History:  Diagnosis Date   ADD (attention deficit disorder)    Allergy    seasonal   Arthritis    Asthma    mild   Blood transfusion without reported diagnosis    Cataract    Complication of anesthesia    can be difficult to wake up/need prompts to breathe   GERD (gastroesophageal reflux disease)    Barrett's   NAUSEA AND VOMITING 04/15/2010   Obesity    Pneumonia    Sleep apnea    Uses c pap, but sleep apnea resolved after surgery   Past Surgical History:  Procedure Laterality Date   ABDOMINAL DEBRIDEMENT  05/26/2012   ABDOMINOPLASTY  01/2009   ADENOIDECTOMY     COLONOSCOPY     ESOPHAGEAL MANOMETRY N/A 11/08/2019   Procedure: ESOPHAGEAL MANOMETRY (EM);  Surgeon: Terri Form, MD;  Location: [facility];  Service: Endoscopy;  Laterality: N/A;   KNEE ARTHROSCOPY     left   LAPAROSCOPIC GASTRIC BANDING     05/2004   POLYPECTOMY     post polyp bleed   seroma     TONSILLECTOMY     TOTAL KNEE ARTHROPLASTY Left 12/02/2020   Procedure: TOTAL KNEE ARTHROPLASTY;  Surgeon: Terri Gross, MD;  Location: [facility];  Service: Orthopedics;  Laterality: Left;    UPPER GASTROINTESTINAL ENDOSCOPY     Barrett's   UVULOPALATOPHARYNGOPLASTY     Patient Active Problem List   Diagnosis Date Noted   OA (osteoarthritis) of knee 12/02/2020   Osteoarthritis of left knee 12/02/2020   Esophageal dysphagia    Left leg weakness 04/01/2016   Left leg  paresthesias 04/01/2016   Noninfected postoperative seroma, lower anterior abdominal wall 04/14/2012   History of laparoscopic adjustable gastric banding, 06/16/2004. 12/01/2011   GERD 04/15/2010   NAUSEA AND VOMITING 04/15/2010   DYSPHAGIA 04/15/2010   BARIATRIC SURGERY STATUS 04/15/2010    Onset date: January 2024  REFERRING DIAG: R49.0 (ICD-10-CM) - Dysphonia   THERAPY DIAG:  No diagnosis found.  Rationale for Evaluation and Treatment: Rehabilitation  SUBJECTIVE:   SUBJECTIVE STATEMENT: Pt has follow up ENT visit today. Pt accompanied by: self  PERTINENT HISTORY: Pt has hx of abnormal ECG, dyspnea on exertion, and chest pain at rest.  Has history of obstructive sleep apnea, ADD, GERD, and obesity. Hoarseness began after congestion from an upper respiratory illness.ENT eval stated voice is "mod hoarse, breathy, gravelly, loss of voice, raspy, strained, weak. 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   PAIN:  Are you having pain? No  FALLS: Has patient fallen in last 6 months? No  PATIENT GOALS: Improve vocal function in order to return fully to work  OBJECTIVE:    PATIENT REPORTED OUTCOME MEASURES (PROM): V-RQOL: 7.5 (poor - worst possible), with "poor" quality in last two weeks, and VHI: 117 ("severe")  TODAY'S TREATMENT:                                                                                                                                         DATE:  Abdominal breathing (AB), SOVTE (Semi-occluded voice treatment exercises) 01/21/23: Pt saw ENT yesterday - nodules have decr'd in size, per pt. Pt also said ENT stated laryngeal area  generally looks better than initial appt.  Today pt entered with 85-90% WNL quality. When SLP noted pt starting slightly pressed voice, asked pt to scan for tension and pt able to regain WNL quality. Played video from pt from July 2023 and voice sounded 95% alike to pt's voice in video (July 2023). SLP reiterated to pt in the next week to ensure she keeps WNL quality to scan multiple times/day for tension and release, perform "centering" phrase for flow/WNL voicing "moomoo's news".SLP also encouarged pt to use her compensations for shouting (clapping) and to use amplifier.   4/23/24Larita [name] arrived speaking with higher volume than confidential voice volume - when she reported on confidential voice she reduced her volume. Terri [name] benefitted again from the concept that confidential voice is like putting her voice in a cast - she can still use it but at less capacity than WNL. SLP needed to remind Terri [name] x4 during session to reduce volume back to confidential tone.  SLP used flow phonation and SOVTE tasks to have pt feel relaxed voice without strain or tension after 4 minutes of AB practice at rest. SLP educated pt about flow phonation homework - feeling her "relaxed voice" after performing straw vocalization in water. Pt repeated flow phrases (see "pt instructions") with 85-90% WNL voice quality. Terri [name] cleared/coughed x4 today. Terri [name] has begun to think ahead for next week when she will be lead on a photo shoot out of town. She is using her voice amplifier so that she can cont to use softer volume. SLP showed Terri [name] some alternatives to shouting - clapping, whistling, etc, for which she thanked SLP.  01/13/23: Terri [name] rates her voice 7/10 today. Pt c/o s/sx VCD today (wanting to throat clear, hoarseness after 10 minutes of conversation). SLP reminded pt that once in ST when she focused on AB for an extended time this feeling subsided.  Pt practiced for 8 minutes twice yesterday. SLP explained rationale for 15 minutes BID and  suggested no < 8 minute practice sessions with water and straw and to complete 4 of these 8-minute sessions if this was her desire (8 minutes).  She and SLP  then went outdoors to practice confidential voice and pt maintained softer voice in 12 minutes conversation. Approx 8 minutes into conversation pt remarked that her voice was hoarse and SLP told pt to focus on AB and visualization. No hoarseness heard after that. SLP encouraged pt to cont to perform these techniques. Three throat clears today, SLP had to cue pt for using alternatives to actively clear (tapping vocal folds, gentle "mmm mmm mmm", silent cough, etc)  01/11/23: Pt rates voice today 7/10. SLP hears much less frequent pitch breaks today than in last sessions, however pt's voice demonstrated fatigue by 38-40 minutes - SLP educated pt she should work for 20 minutes 3-4 times/day with straw buzz in water 15x and then lip trills with pitch variation.  Today when Terri [name] did this (straw water buzz/bubbles x10 followed by lip trills with pitch variation) her voice was WNL - SLP rated 8-9/10.  Sharifah arrived with louder voice - SLP reminded her about confidential voice "like putting your voice in a cast" temporarily. She req'd min A throughout the session to remain in confidential voice.   01/06/23: Rates her voice today 5-6/10. Pt has been doing voice rest, is limiting throat clearing. SLP educated pt on confidential voice today and stressed this throughout session; SLP told pt she should strive to have this level of vocalization when she speaks, if she speaks. SOVTE today with buzz/straw in water with 88% success, req'd initial cues for using abdominal musculature. Pt performed trills with 85% success. Post-trills and straw/buzz in water pt's voice quality improved - pt rated her voice 7/10. Homework for straw/buzz and trills no less than 10 minutes, at least x3/day.  01/04/23: Enters today with breathy, mod hoarse voice but less-strained than previous  sessions. Pitch still appears lower than normal based upon videos from fall 2023. Pt has practiced AB for 13-15 minutes BID and has "caught herself" with AB during the day.  SLP attempted pitch glides hi-lo but pt had much difficulty with achieving pitch variation.Marland Kitchen SOVTE was more successful today in water than in previous session, as she was able to feel oral vibrations with vocalization in water after SLP shaped (straw with buzz, then enter water after 3-4 seconds). SLP told pt to perform buzz outside water for 5 minutes, then enter water after 3-4 seconds for 5 minutes.   12/28/22: Pt enters with breathy, strained voice, lower than average female pitch, comparable to previous session. 3 minutes AB resulted in no feeling like Hannan had to cough or throat clear. Immediately after AB completed, pt coughed. SLP wonders about cyclical cough/vocal cord dysfunction as contributing factor to pt's hoarseness, as pt's feeling of discomfort in her throat was alleviated by AB. Pt voice quality also improved to 4/10 from 2-3/10 when walking back to ST room. SLP initiated SOVTE tasks and pt with success blowing into straw, and blowing into water with straw. She was surprised to hear her vocal quality improve further to 5/10 after straw-water blowing. SLP attempted to have pt with flow phonation task of breathe/vocalize into straw but pt with tightness inhibiting vocalization so SLP worked with pt in "vocal waterfalls" ("oooooooo" from high pitch to low pitch) - first 3 were with limited range but the last 3 were with much wider pitch range and better quality. SLP told pt to do waterfalls after straw blowing in water for 5 minutes. She used compensations to limit coughing/throat clearing to 4 during today's session.  12/23/22: Pt enters with breathy, strained voice, lower than  average female pitch, comparable to previous session. SLP had pt perform AB and she req'd mod cues from SLP for abdominal movement and not thoracic  movement. Pt did not spontaneously change movement without SLP assistance so thus SLP wonders how successful pt practice has been at home. Pt performed AB again, briefly, shortly after initial practice, correctly. SLP asked pt to hear her WNL voice and SLP was directed to youTube and pt's WNL voice was mildly hoarse, with some vocal fry evident occasionally at ends of utterances. SLP reminded pt four times during session for smooth and gentle voice, and rationale for this. Additionally, pt coughed 5 during session feeling like she needed to clear something from pharynx. SLP encouraged pt to bring water next time as an alternative to coughing. When pt practiced AB, no coughs observed. SLP needs to ensure pt with correct procedure with AB prior to moving into straw exercises.   12/20/32: Pt to learn straw phonation next session in order to decr vocal/laryngeal tension. Pt relates to SLP that she noticed by the end of a ten minute conversation that her "voice got squeaky." Pt asks SLP about comedian she is seeing Thursday night and a concert she is going to next week and asked SLP about voice hygiene in these areas. She has not cleared throat >4 times since last session, but has compensated with one sharp cough periodically, which she reports "brings on another cough." SLP wonders about vocal cord dysfuction vs. Vocal hypersenstivity. SLP taught pt about abdominal breathing (AB) and pt successful at rest after approx 30 seconds of SLP guidance.   12/15/22: (eval) SLP educated pt about what vocal nodules are and how they [name], the downward cyclical nature of throat clearing, why pt might feel like she has "bits of cotton" in her throat, and alternatives to throat clearing. SLP discussed program of vocal hygiene, and her focus until next session - limit throat clearing and coughing, and talk in a soft smooth and gentle voice.   PATIENT EDUCATION: Education details: See "Today's Treatment" Person educated:  Patient Education method: Explanation, Demonstration, Verbal cues, and Handouts Education comprehension: verbalized understanding, returned demonstration, verbal cues required, and needs further education  HOME EXERCISE PROGRAM: Vocal hygiene  GOALS: Goals reviewed with patient? Yes  SHORT TERM GOALS: Target date: 01/14/23  Pt will track at least one aspect of vocal hygiene between 5 sessions Baseline: 12/23/22, 01/06/23, 01/11/23, 01/13/23 Goal status: Partially met  2.  Pt will use throat clear alternatives to reduce clears to <5 in 3 sessions Baseline: 12/28/22, 01/04/23 Goal status: Met  3.  Pt will demo abdominal breathing (AB) 80% at rest for 5 minutes x2 sessions Baseline: 01/04/23 Goal status: Met  4.  Pt will tell SLP she has used modifications for voice use each day between 3 sessions Baseline: 01/04/23, 01-11-23 Goal status: Met  5.  Pt will complete vocal relaxation tasks with rare min A in 3 sessions Baseline: 01/11/23, 01/13/23 Goal status: partially met   LONG TERM GOALS: Target date: 02/13/23  Pt will score better on VHI and/or VR-QOL than initial score on eval date Baseline:  Goal status: Ongoing  2.  Pt will complete vocal relaxation tasks with modified indpendence in 3 sessions Baseline:  Goal status: Ongoing  3.  Pt will demo abdominal breathing (AB) 80% in sentence responses x2 sessions Baseline:  Goal status: Ongoing  4.  Pt will demo abdominal breathing (AB) 80% in 7 minutes conversation x3 sessions Baseline:  Goal status: Ongoing  5.  Pt will use alternatives to throat clearing and/or demo no more than 2 clears in 3 consecutive sessions Baseline:  Goal status: Ongoing   ASSESSMENT:  CLINICAL IMPRESSION: Patient is a 67 y.o. female who was seen today for treatment of voice. Voice is not hoarse today, and rare strained voice. Cont to work with pt with WNL voice quality. SEE TX NOTE. In the past she has used vocally abusive behaviors, and "pushed" her  voice at trade shows and at work since developing hoarseness in January after URI. She would like to return to work full-time and requires her voice to do this. She has had to decr her work hours due to her voice.   OBJECTIVE IMPAIRMENTS: include voice disorder. These impairments are limiting patient from return to work, household responsibilities, ADLs/IADLs, and effectively communicating at home and in community. Factors affecting potential to achieve goals and functional outcome are severity of impairments.. Patient will benefit from skilled SLP services to address above impairments and improve overall function.  REHAB POTENTIAL: Good  PLAN:  SLP FREQUENCY: 2x/week  SLP DURATION: 8 weeks or 17 total sessions  PLANNED INTERVENTIONS: Environmental controls, Internal/external aids, Functional tasks, SLP instruction and feedback, Compensatory strategies, Patient/family education, and voice exercises PRN    Tisa Weisel, CCC-SLP 01/21/2023, 1:11 PM

## 2023-01-22 ENCOUNTER — Ambulatory Visit: Payer: BC Managed Care – PPO

## 2023-01-27 DIAGNOSIS — G4733 Obstructive sleep apnea (adult) (pediatric): Secondary | ICD-10-CM | POA: Diagnosis not present

## 2023-02-01 ENCOUNTER — Ambulatory Visit: Payer: BC Managed Care – PPO | Attending: Unknown Physician Specialty

## 2023-02-01 DIAGNOSIS — H40002 Preglaucoma, unspecified, left eye: Secondary | ICD-10-CM | POA: Diagnosis not present

## 2023-02-01 DIAGNOSIS — H40053 Ocular hypertension, bilateral: Secondary | ICD-10-CM | POA: Diagnosis not present

## 2023-02-01 DIAGNOSIS — R490 Dysphonia: Secondary | ICD-10-CM | POA: Insufficient documentation

## 2023-02-01 NOTE — Therapy (Unsigned)
OUTPATIENT SPEECH LANGUAGE PATHOLOGY VOICE TREATMENT   Patient Name: Terri [name] MRN: [id number] DOB:02/28/1956, 67 y.o., female Today's Date: 02/01/2023  PCP: Shirlean Mylar, MD  REFERRING PROVIDER: Linus Salmons, MD   END OF SESSION:  End of Session - 02/01/23 1240     Visit Number 11    Number of Visits 17    Date for SLP Re-Evaluation 02/16/23    SLP Start Time 1234    SLP Stop Time  1315    SLP Time Calculation (min) 41 min    Activity Tolerance Patient tolerated treatment well               Past Medical History:  Diagnosis Date   ADD (attention deficit disorder)    Allergy    seasonal   Arthritis    Asthma    mild   Blood transfusion without reported diagnosis    Cataract    Complication of anesthesia    can be difficult to wake up/need prompts to breathe   GERD (gastroesophageal reflux disease)    Barrett's   NAUSEA AND VOMITING 04/15/2010   Obesity    Pneumonia    Sleep apnea    Uses c pap, but sleep apnea resolved after surgery   Past Surgical History:  Procedure Laterality Date   ABDOMINAL DEBRIDEMENT  05/26/2012   ABDOMINOPLASTY  01/2009   ADENOIDECTOMY     COLONOSCOPY     ESOPHAGEAL MANOMETRY N/A 11/08/2019   Procedure: ESOPHAGEAL MANOMETRY (EM);  Surgeon: Napoleon Form, MD;  Location: [facility];  Service: Endoscopy;  Laterality: N/A;   KNEE ARTHROSCOPY     left   LAPAROSCOPIC GASTRIC BANDING     05/2004   POLYPECTOMY     post polyp bleed   seroma     TONSILLECTOMY     TOTAL KNEE ARTHROPLASTY Left 12/02/2020   Procedure: TOTAL KNEE ARTHROPLASTY;  Surgeon: Ollen Gross, MD;  Location: [facility];  Service: Orthopedics;  Laterality: Left;    UPPER GASTROINTESTINAL ENDOSCOPY     Barrett's   UVULOPALATOPHARYNGOPLASTY     Patient Active Problem List   Diagnosis Date Noted   OA (osteoarthritis) of knee 12/02/2020   Osteoarthritis of left knee 12/02/2020   Esophageal dysphagia    Left leg weakness 04/01/2016   Left leg paresthesias  04/01/2016   Noninfected postoperative seroma, lower anterior abdominal wall 04/14/2012   History of laparoscopic adjustable gastric banding, 06/16/2004. 12/01/2011   GERD 04/15/2010   NAUSEA AND VOMITING 04/15/2010   DYSPHAGIA 04/15/2010   BARIATRIC SURGERY STATUS 04/15/2010    Onset date: January 2024  REFERRING DIAG: R49.0 (ICD-10-CM) - Dysphonia   THERAPY DIAG:  Hoarseness  Rationale for Evaluation and Treatment: Rehabilitation  SUBJECTIVE:   SUBJECTIVE STATEMENT: Pt has follow up ENT visit today. Pt accompanied by: self  PERTINENT HISTORY: Pt has hx of abnormal ECG, dyspnea on exertion, and chest pain at rest.  Has history of obstructive sleep apnea, ADD, GERD, and obesity. Hoarseness began after congestion from an upper respiratory illness.ENT eval stated voice is "mod hoarse, breathy, gravelly, loss of voice, raspy, strained, weak. 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   PAIN:  Are you having pain? No  FALLS: Has patient fallen in last 6 months? No  PATIENT GOALS: Improve vocal function in order to return fully to work  OBJECTIVE:    PATIENT REPORTED OUTCOME MEASURES (PROM): V-RQOL: 7.5 (poor - worst possible), with "poor" quality in last two weeks, and VHI: 117 ("severe")  TODAY'S TREATMENT:                                                                                                                                         DATE:  Abdominal breathing (AB), SOVTE (Semi-occluded voice treatment exercises) 01/21/23: Pt saw ENT yesterday - nodules have decr'd in size, per pt. Pt also said ENT stated laryngeal area generally looks better  than initial appt.  Today pt entered with 85-90% WNL quality. When SLP noted pt starting slightly pressed voice, asked pt to scan for tension and pt able to regain WNL quality. Played video from pt from July 2023 and voice sounded 95% alike to pt's voice in video (July 2023). SLP reiterated to pt in the next week to ensure she keeps WNL quality to scan multiple times/day for tension and release, perform "centering" phrase for flow/WNL voicing "moomoo's news".SLP also encouarged pt to use her compensations for shouting (clapping) and to use amplifier.   4/23/24[name] arrived speaking with higher volume than confidential voice volume - when she reported on confidential voice she reduced her volume. [name] benefitted again from the concept that confidential voice is like putting her voice in a cast - she can still use it but at less capacity than WNL. SLP needed to remind [name] x4 during session to reduce volume back to confidential tone.  SLP used flow phonation and SOVTE tasks to have pt feel relaxed voice without strain or tension after 4 minutes of AB practice at rest. SLP educated pt about flow phonation homework - feeling her "relaxed voice" after performing straw vocalization in water. Pt repeated flow phrases (see "pt instructions") with 85-90% WNL voice quality. [name] cleared/coughed x4 today. [name] has begun to think ahead for next week when she will be lead on a photo shoot out of town. She is using her voice amplifier so that she can cont to use softer volume. SLP showed [name] some alternatives to shouting - clapping, whistling, etc, for which she thanked SLP.  01/13/23: [name] rates her voice 7/10 today. Pt c/o s/sx VCD today (wanting to throat clear, hoarseness after 10 minutes of conversation). SLP reminded pt that once in ST when she focused on AB for an extended time this feeling subsided.  Pt practiced for 8 minutes twice yesterday. SLP explained rationale for 15 minutes BID and suggested no < 8 minute  practice sessions with water and straw and to complete 4 of these 8-minute sessions if this was her desire (8 minutes).  She and SLP then went  outdoors to practice confidential voice and pt maintained softer voice in 12 minutes conversation. Approx 8 minutes into conversation pt remarked that her voice was hoarse and SLP told pt to focus on AB and visualization. No hoarseness heard after that. SLP encouraged pt to cont to perform these techniques. Three throat clears today, SLP had to cue pt for using alternatives to actively clear (tapping vocal folds, gentle "mmm mmm mmm", silent cough, etc)  01/11/23: Pt rates voice today 7/10. SLP hears much less frequent pitch breaks today than in last sessions, however pt's voice demonstrated fatigue by 38-40 minutes - SLP educated pt she should work for 20 minutes 3-4 times/day with straw buzz in water 15x and then lip trills with pitch variation.  Today when [name] did this (straw water buzz/bubbles x10 followed by lip trills with pitch variation) her voice was WNL - SLP rated 8-9/10.  Kamra arrived with louder voice - SLP reminded her about confidential voice "like putting your voice in a cast" temporarily. She req'd min A throughout the session to remain in confidential voice.   01/06/23: Rates her voice today 5-6/10. Pt has been doing voice rest, is limiting throat clearing. SLP educated pt on confidential voice today and stressed this throughout session; SLP told pt she should strive to have this level of vocalization when she speaks, if she speaks. SOVTE today with buzz/straw in water with 88% success, req'd initial cues for using abdominal musculature. Pt performed trills with 85% success. Post-trills and straw/buzz in water pt's voice quality improved - pt rated her voice 7/10. Homework for straw/buzz and trills no less than 10 minutes, at least x3/day.  01/04/23: Enters today with breathy, mod hoarse voice but less-strained than previous sessions. Pitch still  appears lower than normal based upon videos from fall 2023. Pt has practiced AB for 13-15 minutes BID and has "caught herself" with AB during the day.  SLP attempted pitch glides hi-lo but pt had much difficulty with achieving pitch variation.Marland Kitchen SOVTE was more successful today in water than in previous session, as she was able to feel oral vibrations with vocalization in water after SLP shaped (straw with buzz, then enter water after 3-4 seconds). SLP told pt to perform buzz outside water for 5 minutes, then enter water after 3-4 seconds for 5 minutes.   12/28/22: Pt enters with breathy, strained voice, lower than average female pitch, comparable to previous session. 3 minutes AB resulted in no feeling like Moraima had to cough or throat clear. Immediately after AB completed, pt coughed. SLP wonders about cyclical cough/vocal cord dysfunction as contributing factor to pt's hoarseness, as pt's feeling of discomfort in her throat was alleviated by AB. Pt voice quality also improved to 4/10 from 2-3/10 when walking back to ST room. SLP initiated SOVTE tasks and pt with success blowing into straw, and blowing into water with straw. She was surprised to hear her vocal quality improve further to 5/10 after straw-water blowing. SLP attempted to have pt with flow phonation task of breathe/vocalize into straw but pt with tightness inhibiting vocalization so SLP worked with pt in "vocal waterfalls" ("oooooooo" from high pitch to low pitch) - first 3 were with limited range but the last 3 were with much wider pitch range and better quality. SLP told pt to do waterfalls after straw blowing in water for 5 minutes. She used compensations to limit coughing/throat clearing to 4 during today's session.  12/23/22: Pt enters with breathy, strained voice, lower than average female  pitch, comparable to previous session. SLP had pt perform AB and she req'd mod cues from SLP for abdominal movement and not thoracic movement. Pt did not  spontaneously change movement without SLP assistance so thus SLP wonders how successful pt practice has been at home. Pt performed AB again, briefly, shortly after initial practice, correctly. SLP asked pt to hear her WNL voice and SLP was directed to youTube and pt's WNL voice was mildly hoarse, with some vocal fry evident occasionally at ends of utterances. SLP reminded pt four times during session for smooth and gentle voice, and rationale for this. Additionally, pt coughed 5 during session feeling like she needed to clear something from pharynx. SLP encouraged pt to bring water next time as an alternative to coughing. When pt practiced AB, no coughs observed. SLP needs to ensure pt with correct procedure with AB prior to moving into straw exercises.   12/20/32: Pt to learn straw phonation next session in order to decr vocal/laryngeal tension. Pt relates to SLP that she noticed by the end of a ten minute conversation that her "voice got squeaky." Pt asks SLP about comedian she is seeing Thursday night and a concert she is going to next week and asked SLP about voice hygiene in these areas. She has not cleared throat >4 times since last session, but has compensated with one sharp cough periodically, which she reports "brings on another cough." SLP wonders about vocal cord dysfuction vs. Vocal hypersenstivity. SLP taught pt about abdominal breathing (AB) and pt successful at rest after approx 30 seconds of SLP guidance.   12/15/22: (eval) SLP educated pt about what vocal nodules are and how they form, the downward cyclical nature of throat clearing, why pt might feel like she has "bits of cotton" in her throat, and alternatives to throat clearing. SLP discussed program of vocal hygiene, and her focus until next session - limit throat clearing and coughing, and talk in a soft smooth and gentle voice.   PATIENT EDUCATION: Education details: See "Today's Treatment" Person educated: Patient Education method:  Explanation, Demonstration, Verbal cues, and Handouts Education comprehension: verbalized understanding, returned demonstration, verbal cues required, and needs further education  HOME EXERCISE PROGRAM: Vocal hygiene  GOALS: Goals reviewed with patient? Yes  SHORT TERM GOALS: Target date: 01/14/23  Pt will track at least one aspect of vocal hygiene between 5 sessions Baseline: 12/23/22, 01/06/23, 01/11/23, 01/13/23 Goal status: Partially met  2.  Pt will use throat clear alternatives to reduce clears to <5 in 3 sessions Baseline: 12/28/22, 01/04/23 Goal status: Met  3.  Pt will demo abdominal breathing (AB) 80% at rest for 5 minutes x2 sessions Baseline: 01/04/23 Goal status: Met  4.  Pt will tell SLP she has used modifications for voice use each day between 3 sessions Baseline: 01/04/23, 01-11-23 Goal status: Met  5.  Pt will complete vocal relaxation tasks with rare min A in 3 sessions Baseline: 01/11/23, 01/13/23 Goal status: partially met   LONG TERM GOALS: Target date: 02/13/23  Pt will score better on VHI and/or VR-QOL than initial score on eval date Baseline:  Goal status: Ongoing  2.  Pt will complete vocal relaxation tasks with modified indpendence in 3 sessions Baseline:  Goal status: Ongoing  3.  Pt will demo abdominal breathing (AB) 80% in sentence responses x2 sessions Baseline:  Goal status: Ongoing  4.  Pt will demo abdominal breathing (AB) 80% in 7 minutes conversation x3 sessions Baseline:  Goal status: Ongoing  5.  Pt will use alternatives to throat clearing and/or demo no more than 2 clears in 3 consecutive sessions Baseline:  Goal status: Ongoing   ASSESSMENT:  CLINICAL IMPRESSION: Patient is a 67 y.o. female who was seen today for treatment of voice. Voice is not hoarse today, and rare strained voice. Cont to work with pt with WNL voice quality. SEE TX NOTE. In the past she has used vocally abusive behaviors, and "pushed" her voice at trade shows and at  work since developing hoarseness in January after URI. She would like to return to work full-time and requires her voice to do this. She has had to decr her work hours due to her voice.   OBJECTIVE IMPAIRMENTS: include voice disorder. These impairments are limiting patient from return to work, household responsibilities, ADLs/IADLs, and effectively communicating at home and in community. Factors affecting potential to achieve goals and functional outcome are severity of impairments.. Patient will benefit from skilled SLP services to address above impairments and improve overall function.  REHAB POTENTIAL: Good  PLAN:  SLP FREQUENCY: 2x/week  SLP DURATION: 8 weeks or 17 total sessions  PLANNED INTERVENTIONS: Environmental controls, Internal/external aids, Functional tasks, SLP instruction and feedback, Compensatory strategies, Patient/family education, and voice exercises PRN    Jontrell Bushong, CCC-SLP 02/01/2023, 12:46 PM

## 2023-02-03 ENCOUNTER — Ambulatory Visit: Payer: BC Managed Care – PPO

## 2023-02-03 DIAGNOSIS — G4733 Obstructive sleep apnea (adult) (pediatric): Secondary | ICD-10-CM | POA: Diagnosis not present

## 2023-02-10 ENCOUNTER — Ambulatory Visit: Payer: BC Managed Care – PPO

## 2023-02-17 ENCOUNTER — Ambulatory Visit: Payer: BC Managed Care – PPO

## 2023-02-17 DIAGNOSIS — R490 Dysphonia: Secondary | ICD-10-CM | POA: Diagnosis not present

## 2023-02-17 NOTE — Therapy (Signed)
OUTPATIENT SPEECH LANGUAGE PATHOLOGY VOICE TREATMENT/RECERTIFICATION   Patient Name: Terri [name] MRN: [id number] DOB:04/10/1956, 67 y.o., female Today's Date: 02/17/2023  PCP: Shirlean Mylar, MD  REFERRING PROVIDER: Linus Salmons, MD   END OF SESSION:  End of Session - 02/17/23 1023     Visit Number 12    Number of Visits 17    Date for SLP Re-Evaluation 02/16/23    SLP Start Time 1020    SLP Stop Time  1100    SLP Time Calculation (min) 40 min    Activity Tolerance Patient tolerated treatment well               Past Medical History:  Diagnosis Date   ADD (attention deficit disorder)    Allergy    seasonal   Arthritis    Asthma    mild   Blood transfusion without reported diagnosis    Cataract    Complication of anesthesia    can be difficult to wake up/need prompts to breathe   GERD (gastroesophageal reflux disease)    Barrett's   NAUSEA AND VOMITING 04/15/2010   Obesity    Pneumonia    Sleep apnea    Uses c pap, but sleep apnea resolved after surgery   Past Surgical History:  Procedure Laterality Date   ABDOMINAL DEBRIDEMENT  05/26/2012   ABDOMINOPLASTY  01/2009   ADENOIDECTOMY     COLONOSCOPY     ESOPHAGEAL MANOMETRY N/A 11/08/2019   Procedure: ESOPHAGEAL MANOMETRY (EM);  Surgeon: Napoleon Form, MD;  Location: [facility];  Service: Endoscopy;  Laterality: N/A;   KNEE ARTHROSCOPY     left   LAPAROSCOPIC GASTRIC BANDING     05/2004   POLYPECTOMY     post polyp bleed   seroma     TONSILLECTOMY     TOTAL KNEE ARTHROPLASTY Left 12/02/2020   Procedure: TOTAL KNEE ARTHROPLASTY;  Surgeon: Ollen Gross, MD;  Location: [facility];  Service: Orthopedics;  Laterality: Left;    UPPER GASTROINTESTINAL ENDOSCOPY     Barrett's   UVULOPALATOPHARYNGOPLASTY     Patient Active Problem List   Diagnosis Date Noted   OA (osteoarthritis) of knee 12/02/2020   Osteoarthritis of left knee 12/02/2020   Esophageal dysphagia    Left leg weakness 04/01/2016   Left  leg paresthesias 04/01/2016   Noninfected postoperative seroma, lower anterior abdominal wall 04/14/2012   History of laparoscopic adjustable gastric banding, 06/16/2004. 12/01/2011   GERD 04/15/2010   NAUSEA AND VOMITING 04/15/2010   DYSPHAGIA 04/15/2010   BARIATRIC SURGERY STATUS 04/15/2010  Speech Therapy Progress Note  Dates of Reporting Period: 12/15/22 to present  Subjective Statement: Pt has been seen for 12 ST sessions focusing on voice quality and hygiene.  Objective: See below  Goal Update: See below  Plan: two-three more sessions of ST  Reason Skilled Services are Required: SLP needs to verify progress over time.   Onset date: January 2024  REFERRING DIAG: R49.0 (ICD-10-CM) - Dysphonia   THERAPY DIAG:  Hoarseness  Rationale for Evaluation and Treatment: Rehabilitation  SUBJECTIVE:   SUBJECTIVE STATEMENT: "Now that I have my voice back - you can't shut me up." Pt accompanied by: self  PERTINENT HISTORY: Pt has hx of abnormal ECG, dyspnea on exertion, and chest pain at rest.  Has history of obstructive sleep apnea, ADD, GERD, and obesity. Hoarseness began after congestion from an upper respiratory illness.ENT eval stated voice is "mod hoarse, breathy, gravelly, loss of voice, raspy, strained, weak. 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   PAIN:  Are you having pain? No  FALLS: Has patient fallen in last 6 months? No  PATIENT GOALS: Improve vocal function in order to return fully to work  OBJECTIVE:    PATIENT REPORTED OUTCOME MEASURES (PROM): V-RQOL: 7.5 (poor - worst possible), with "poor" quality in last two weeks, and VHI:  117 ("severe")  TODAY'S TREATMENT:                                                                                                                                         DATE:  Abdominal breathing (AB), SOVTE (Semi-occluded voice treatment exercises) 02/17/23: Pt did not scream at the concert. She is using flow phonation centering techniques/phrases to relax her voice throughout the day. She is thinking about how to modify her shouting at baseball games and will purchase noisemaker to conserve her voice. SLP educated pt to think of her voice as a full checking account at the start of the day and each time she talks she writes a check and if she has an event in the evening she will need to conserve her voice through the rest of the day. Pt was very appreciative for ST thus far and will return in two weeks, then in 4 weeks to wean therapy visits. She spoke with AB in conversation today. She reported using throat clearing alternatives since last session.  VRQOL completed today with a raw score of 13 (good to excellent) and a VRQOL score of 87 (good-excellent).  02/01/23: Today pt arrived with mild intermittent hoarseness with rating voice a 7-8/10. She wore her voice amplifier for 3 of the 5 [place], working for 11-12 hours/day. She performed relaxation exercises when she could but this was not often enough she stated. 10 minutes into session SLP told pt to focus on AB and scan/relaxation strategies for 4 minutes. Afterwards, no hoarseness heard and pt with "a solid 8/10" self rated. SLP rated pt's voice 8-9/10; This was the best quality pt had since initiation of therapy and SLP told pt this. SLP strongly encouraged pt to perform relaxation 8-10/day in order to habitualize smooth and gentle voicing and pt agreed. SLP reinforced this by telling pt she needed to "care for her instrument (voice)" - pt again agreed. SLP and pt both thought pt could reduce to once/week if she can maintain  scan/release of tension x8-10/day.  01/21/23: Pt saw ENT yesterday - nodules have decr'd in size, per pt. Pt also said ENT stated  laryngeal area generally looks better than initial appt.  Today pt entered with 85-90% WNL quality. When SLP noted pt starting slightly pressed voice, asked pt to scan for tension and pt able to regain WNL quality. Played video from pt from July 2023 and voice sounded 95% alike to pt's voice in video (July 2023). SLP reiterated to pt in the next week to ensure she keeps WNL quality to scan multiple times/day for tension and release, perform "centering" phrase for flow/WNL voicing "moomoo's news".SLP also encouarged pt to use her compensations for shouting (clapping) and to use amplifier.   4/23/24[name] arrived speaking with higher volume than confidential voice volume - when she reported on confidential voice she reduced her volume. Arriyana benefitted again from the concept that confidential voice is like putting her voice in a cast - she can still use it but at less capacity than WNL. SLP needed to remind Merilyn x4 during session to reduce volume back to confidential tone.  SLP used flow phonation and SOVTE tasks to have pt feel relaxed voice without strain or tension after 4 minutes of AB practice at rest. SLP educated pt about flow phonation homework - feeling her "relaxed voice" after performing straw vocalization in water. Pt repeated flow phrases (see "pt instructions") with 85-90% WNL voice quality. Licia cleared/coughed x4 today. Nolita has begun to think ahead for next week when she will be lead on a photo shoot out of town. She is using her voice amplifier so that she can cont to use softer volume. SLP showed Dominga some alternatives to shouting - clapping, whistling, etc, for which she thanked SLP.  01/13/23: [name] rates her voice 7/10 today. Pt c/o s/sx VCD today (wanting to throat clear, hoarseness after 10 minutes of conversation). SLP reminded pt that once in ST when she  focused on AB for an extended time this feeling subsided.  Pt practiced for 8 minutes twice yesterday. SLP explained rationale for 15 minutes BID and suggested no < 8 minute practice sessions with water and straw and to complete 4 of these 8-minute sessions if this was her desire (8 minutes).  She and SLP then went outdoors to practice confidential voice and pt maintained softer voice in 12 minutes conversation. Approx 8 minutes into conversation pt remarked that her voice was hoarse and SLP told pt to focus on AB and visualization. No hoarseness heard after that. SLP encouraged pt to cont to perform these techniques. Three throat clears today, SLP had to cue pt for using alternatives to actively clear (tapping vocal folds, gentle "mmm mmm mmm", silent cough, etc)  01/11/23: Pt rates voice today 7/10. SLP hears much less frequent pitch breaks today than in last sessions, however pt's voice demonstrated fatigue by 38-40 minutes - SLP educated pt she should work for 20 minutes 3-4 times/day with straw buzz in water 15x and then lip trills with pitch variation.  Today when [name] did this (straw water buzz/bubbles x10 followed by lip trills with pitch variation) her voice was WNL - SLP rated 8-9/10.  Rachiel arrived with louder voice - SLP reminded her about confidential voice "like putting your voice in a cast" temporarily. She req'd min A throughout the session to remain in confidential voice.   01/06/23: Rates her voice today 5-6/10. Pt has been doing voice rest, is limiting throat clearing. SLP educated pt on confidential voice today and stressed this throughout session; SLP told pt she should strive to have this level of vocalization when she  speaks, if she speaks. SOVTE today with buzz/straw in water with 88% success, req'd initial cues for using abdominal musculature. Pt performed trills with 85% success. Post-trills and straw/buzz in water pt's voice quality improved - pt rated her voice 7/10. Homework for  straw/buzz and trills no less than 10 minutes, at least x3/day.  01/04/23: Enters today with breathy, mod hoarse voice but less-strained than previous sessions. Pitch still appears lower than normal based upon videos from fall 2023. Pt has practiced AB for 13-15 minutes BID and has "caught herself" with AB during the day.  SLP attempted pitch glides hi-lo but pt had much difficulty with achieving pitch variation.Marland Kitchen SOVTE was more successful today in water than in previous session, as she was able to feel oral vibrations with vocalization in water after SLP shaped (straw with buzz, then enter water after 3-4 seconds). SLP told pt to perform buzz outside water for 5 minutes, then enter water after 3-4 seconds for 5 minutes.   12/28/22: Pt enters with breathy, strained voice, lower than average female pitch, comparable to previous session. 3 minutes AB resulted in no feeling like Areya had to cough or throat clear. Immediately after AB completed, pt coughed. SLP wonders about cyclical cough/vocal cord dysfunction as contributing factor to pt's hoarseness, as pt's feeling of discomfort in her throat was alleviated by AB. Pt voice quality also improved to 4/10 from 2-3/10 when walking back to ST room. SLP initiated SOVTE tasks and pt with success blowing into straw, and blowing into water with straw. She was surprised to hear her vocal quality improve further to 5/10 after straw-water blowing. SLP attempted to have pt with flow phonation task of breathe/vocalize into straw but pt with tightness inhibiting vocalization so SLP worked with pt in "vocal waterfalls" ("oooooooo" from high pitch to low pitch) - first 3 were with limited range but the last 3 were with much wider pitch range and better quality. SLP told pt to do waterfalls after straw blowing in water for 5 minutes. She used compensations to limit coughing/throat clearing to 4 during today's session.  12/23/22: Pt enters with breathy, strained voice, lower than  average female pitch, comparable to previous session. SLP had pt perform AB and she req'd mod cues from SLP for abdominal movement and not thoracic movement. Pt did not spontaneously change movement without SLP assistance so thus SLP wonders how successful pt practice has been at home. Pt performed AB again, briefly, shortly after initial practice, correctly. SLP asked pt to hear her WNL voice and SLP was directed to youTube and pt's WNL voice was mildly hoarse, with some vocal fry evident occasionally at ends of utterances. SLP reminded pt four times during session for smooth and gentle voice, and rationale for this. Additionally, pt coughed 5 during session feeling like she needed to clear something from pharynx. SLP encouraged pt to bring water next time as an alternative to coughing. When pt practiced AB, no coughs observed. SLP needs to ensure pt with correct procedure with AB prior to moving into straw exercises.   12/20/32: Pt to learn straw phonation next session in order to decr vocal/laryngeal tension. Pt relates to SLP that she noticed by the end of a ten minute conversation that her "voice got squeaky." Pt asks SLP about comedian she is seeing Thursday night and a concert she is going to next week and asked SLP about voice hygiene in these areas. She has not cleared throat >4 times since last session, but  has compensated with one sharp cough periodically, which she reports "brings on another cough." SLP wonders about vocal cord dysfuction vs. Vocal hypersenstivity. SLP taught pt about abdominal breathing (AB) and pt successful at rest after approx 30 seconds of SLP guidance.   12/15/22: (eval) SLP educated pt about what vocal nodules are and how they form, the downward cyclical nature of throat clearing, why pt might feel like she has "bits of cotton" in her throat, and alternatives to throat clearing. SLP discussed program of vocal hygiene, and her focus until next session - limit throat clearing and  coughing, and talk in a soft smooth and gentle voice.   PATIENT EDUCATION: Education details: See "Today's Treatment" Person educated: Patient Education method: Explanation, Demonstration, Verbal cues, and Handouts Education comprehension: verbalized understanding, returned demonstration, verbal cues required, and needs further education  HOME EXERCISE PROGRAM: Vocal hygiene  GOALS: Goals reviewed with patient? Yes  SHORT TERM GOALS: Target date: 01/14/23  Pt will track at least one aspect of vocal hygiene between 5 sessions Baseline: 12/23/22, 01/06/23, 01/11/23, 01/13/23 Goal status: Partially met  2.  Pt will use throat clear alternatives to reduce clears to <5 in 3 sessions Baseline: 12/28/22, 01/04/23 Goal status: Met  3.  Pt will demo abdominal breathing (AB) 80% at rest for 5 minutes x2 sessions Baseline: 01/04/23 Goal status: Met  4.  Pt will tell SLP she has used modifications for voice use each day between 3 sessions Baseline: 01/04/23, 01-11-23 Goal status: Met  5.  Pt will complete vocal relaxation tasks with rare min A in 3 sessions Baseline: 01/11/23, 01/13/23 Goal status: partially met   LONG TERM GOALS: Target date: 7//24  Pt will score better on VHI and/or VR-QOL than initial score on eval date Baseline:  Goal status: Ongoing  2.  Pt will complete vocal relaxation tasks with modified indpendence in 3 sessions Baseline: 02/01/23, 02/17/23 Goal status: Ongoing  3.  Pt will demo abdominal breathing (AB) 80% in sentence responses x2 sessions Baseline: 02/01/23 Goal status: Met  4.  Pt will demo abdominal breathing (AB) 80% in 7 minutes conversation x3 sessions Baseline: 02/17/23 Goal status: Ongoing  5.  Pt will use alternatives to throat clearing and/or demo no more than 2 clears in 3 consecutive sessions Baseline: 02/17/23 Goal status: Partially met   ASSESSMENT:  CLINICAL IMPRESSION: RECERT TODAY. Patient is a 67 y.o. female who was seen today for treatment of  voice. Voice is not hoarse today, and rare strained voice. She has made phenomenal gains with her vocal quality. SEE TX NOTE. 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  OBJECTIVE IMPAIRMENTS: include voice disorder. These impairments are limiting patient from return to work, household responsibilities, ADLs/IADLs, and effectively communicating at home and in community. Factors affecting potential to achieve goals and functional outcome are severity of impairments.. Patient will benefit from skilled SLP services to address above impairments and improve overall function.  REHAB POTENTIAL: Good  PLAN:  SLP FREQUENCY: 1x/week  SLP DURATION: 8 weeks or 17 total sessions  PLANNED INTERVENTIONS: Environmental controls, Internal/external aids, Functional tasks, SLP instruction and feedback, Compensatory strategies, Patient/family education, and voice exercises PRN    Tonilynn Bieker, CCC-SLP 02/17/2023, 10:26 AM

## 2023-02-23 ENCOUNTER — Ambulatory Visit: Payer: BC Managed Care – PPO

## 2023-02-27 DIAGNOSIS — G4733 Obstructive sleep apnea (adult) (pediatric): Secondary | ICD-10-CM | POA: Diagnosis not present

## 2023-03-03 ENCOUNTER — Ambulatory Visit: Payer: BC Managed Care – PPO

## 2023-03-04 ENCOUNTER — Ambulatory Visit: Payer: BC Managed Care – PPO | Attending: Unknown Physician Specialty

## 2023-03-04 DIAGNOSIS — R490 Dysphonia: Secondary | ICD-10-CM | POA: Diagnosis not present

## 2023-03-04 NOTE — Therapy (Signed)
OUTPATIENT SPEECH LANGUAGE PATHOLOGY VOICE TREATMENT/RECERTIFICATION   Patient Name: Terri [name] MRN: [id number] DOB:01-27-1956, 67 y.o., female Today's Date: 03/04/2023  PCP: Shirlean Mylar, MD  REFERRING PROVIDER: Linus Salmons, MD   END OF SESSION:  End of Session - 03/04/23 1718     Visit Number 13    Number of Visits 17    Date for SLP Re-Evaluation 04/18/23    SLP Start Time 1617    SLP Stop Time  1656    SLP Time Calculation (min) 39 min    Activity Tolerance Patient tolerated treatment well                Past Medical History:  Diagnosis Date   ADD (attention deficit disorder)    Allergy    seasonal   Arthritis    Asthma    mild   Blood transfusion without reported diagnosis    Cataract    Complication of anesthesia    can be difficult to wake up/need prompts to breathe   GERD (gastroesophageal reflux disease)    Barrett's   NAUSEA AND VOMITING 04/15/2010   Obesity    Pneumonia    Sleep apnea    Uses c pap, but sleep apnea resolved after surgery   Past Surgical History:  Procedure Laterality Date   ABDOMINAL DEBRIDEMENT  05/26/2012   ABDOMINOPLASTY  01/2009   ADENOIDECTOMY     COLONOSCOPY     ESOPHAGEAL MANOMETRY N/A 11/08/2019   Procedure: ESOPHAGEAL MANOMETRY (EM);  Surgeon: Napoleon Form, MD;  Location: [facility];  Service: Endoscopy;  Laterality: N/A;   KNEE ARTHROSCOPY     left   LAPAROSCOPIC GASTRIC BANDING     05/2004   POLYPECTOMY     post polyp bleed   seroma     TONSILLECTOMY     TOTAL KNEE ARTHROPLASTY Left 12/02/2020   Procedure: TOTAL KNEE ARTHROPLASTY;  Surgeon: Ollen Gross, MD;  Location: [facility];  Service: Orthopedics;  Laterality: Left;    UPPER GASTROINTESTINAL ENDOSCOPY     Barrett's   UVULOPALATOPHARYNGOPLASTY     Patient Active Problem List   Diagnosis Date Noted   OA (osteoarthritis) of knee 12/02/2020   Osteoarthritis of left knee 12/02/2020   Esophageal dysphagia    Left leg weakness 04/01/2016    Left leg paresthesias 04/01/2016   Noninfected postoperative seroma, lower anterior abdominal wall 04/14/2012   History of laparoscopic adjustable gastric banding, 06/16/2004. 12/01/2011   GERD 04/15/2010   NAUSEA AND VOMITING 04/15/2010   DYSPHAGIA 04/15/2010   BARIATRIC SURGERY STATUS 04/15/2010    Onset date: January 2024  REFERRING DIAG: R49.0 (ICD-10-CM) - Dysphonia   THERAPY DIAG:  Hoarseness  Rationale for Evaluation and Treatment: Rehabilitation  SUBJECTIVE:   SUBJECTIVE STATEMENT: "That's such a good idea about the checking account for my voice." Pt accompanied by: self  PERTINENT HISTORY: Pt has hx of abnormal ECG, dyspnea on exertion, and chest pain at rest.  Has history of obstructive sleep apnea, ADD, GERD, and obesity. Hoarseness began after congestion from an upper respiratory illness.ENT eval stated voice is "mod hoarse, breathy, gravelly, loss of voice, raspy, strained, weak. 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   PAIN:  Are you having pain? No  FALLS: Has patient fallen in last 6 months? No  PATIENT GOALS: Improve vocal function in order to return fully to work  OBJECTIVE:    PATIENT REPORTED OUTCOME MEASURES (PROM): V-RQOL: 7.5 (poor - worst possible), with "poor" quality in last two weeks, and VHI: 117 ("severe")  TODAY'S TREATMENT:                                                                                                                                         DATE:  Abdominal breathing (AB), SOVTE (Semi-occluded voice treatment exercises)  03/04/23: Pt entered with WNL voice quality. She described two situations  where she used compensatory strategies for maintaining softer voice. During communication today she used AB 80%. Caisley cleared her throat 5 times today. SLP and pt agreed d/c will likely occur next session if her success with voice continues until then.   02/17/23: Pt did not scream at the concert. She is using flow phonation centering techniques/phrases to relax her voice throughout the day. She is thinking about how to modify her shouting at baseball games and will purchase noisemaker to conserve her voice. SLP educated pt to think of her voice as a full checking account at the start of the day and each time she talks she writes a check and if she has an event in the evening she will need to conserve her voice through the rest of the day. Pt was very appreciative for ST thus far and will return in two weeks, then in 4 weeks to wean therapy visits. She spoke with AB in conversation today. She reported using throat clearing alternatives since last session.  VRQOL completed today with a raw score of 13 (good to excellent) and a VRQOL score of 87 (good-excellent), improved from 7.5 (or "worst possible"). VHI also completed today with a score of 10, improved from 117.  02/01/23: Today pt arrived with mild intermittent hoarseness with rating voice a 7-8/10. She wore her voice amplifier for 3 of the 5 [place], working for 11-12 hours/day. She performed relaxation exercises when she could but this was not often enough she stated. 10 minutes into session SLP told pt to focus on AB and scan/relaxation strategies for 4 minutes. Afterwards, no hoarseness heard and pt with "a solid 8/10" self rated. SLP rated pt's voice 8-9/10; This was the best quality pt had since initiation of therapy and SLP told pt this. SLP strongly encouraged pt to perform relaxation 8-10/day in order to habitualize smooth and gentle voicing and pt agreed. SLP reinforced this by telling pt she needed to "care for her instrument  (voice)" - pt again agreed. SLP and pt both thought pt could reduce to once/week if she can maintain scan/release of tension x8-10/day.  01/21/23: Pt saw  ENT yesterday - nodules have decr'd in size, per pt. Pt also said ENT stated laryngeal area generally looks better than initial appt.  Today pt entered with 85-90% WNL quality. When SLP noted pt starting slightly pressed voice, asked pt to scan for tension and pt able to regain WNL quality. Played video from pt from July 2023 and voice sounded 95% alike to pt's voice in video (July 2023). SLP reiterated to pt in the next week to ensure she keeps WNL quality to scan multiple times/day for tension and release, perform "centering" phrase for flow/WNL voicing "moomoo's news".SLP also encouarged pt to use her compensations for shouting (clapping) and to use amplifier.   4/23/24[name] arrived speaking with higher volume than confidential voice volume - when she reported on confidential voice she reduced her volume. Laureli benefitted again from the concept that confidential voice is like putting her voice in a cast - she can still use it but at less capacity than WNL. SLP needed to remind Namita x4 during session to reduce volume back to confidential tone.  SLP used flow phonation and SOVTE tasks to have pt feel relaxed voice without strain or tension after 4 minutes of AB practice at rest. SLP educated pt about flow phonation homework - feeling her "relaxed voice" after performing straw vocalization in water. Pt repeated flow phrases (see "pt instructions") with 85-90% WNL voice quality. Gianah cleared/coughed x4 today. Dyllan has begun to think ahead for next week when she will be lead on a photo shoot out of town. She is using her voice amplifier so that she can cont to use softer volume. SLP showed Marcella some alternatives to shouting - clapping, whistling, etc, for which she thanked SLP.  01/13/23: [name] rates her voice 7/10 today. Pt c/o s/sx VCD today (wanting to  throat clear, hoarseness after 10 minutes of conversation). SLP reminded pt that once in ST when she focused on AB for an extended time this feeling subsided.  Pt practiced for 8 minutes twice yesterday. SLP explained rationale for 15 minutes BID and suggested no < 8 minute practice sessions with water and straw and to complete 4 of these 8-minute sessions if this was her desire (8 minutes).  She and SLP then went outdoors to practice confidential voice and pt maintained softer voice in 12 minutes conversation. Approx 8 minutes into conversation pt remarked that her voice was hoarse and SLP told pt to focus on AB and visualization. No hoarseness heard after that. SLP encouraged pt to cont to perform these techniques. Three throat clears today, SLP had to cue pt for using alternatives to actively clear (tapping vocal folds, gentle "mmm mmm mmm", silent cough, etc)  01/11/23: Pt rates voice today 7/10. SLP hears much less frequent pitch breaks today than in last sessions, however pt's voice demonstrated fatigue by 38-40 minutes - SLP educated pt she should work for 20 minutes 3-4 times/day with straw buzz in water 15x and then lip trills with pitch variation.  Today when [name] did this (straw water buzz/bubbles x10 followed by lip trills with pitch variation) her voice was WNL - SLP rated 8-9/10.  Salmah arrived with louder voice - SLP reminded her about confidential voice "like putting your voice in a cast" temporarily. She req'd min A throughout the session to remain in confidential voice.   01/06/23: Rates her voice today 5-6/10. Pt has been doing voice rest, is limiting throat clearing. SLP educated pt on confidential voice today and stressed this throughout  session; SLP told pt she should strive to have this level of vocalization when she speaks, if she speaks. SOVTE today with buzz/straw in water with 88% success, req'd initial cues for using abdominal musculature. Pt performed trills with 85% success.  Post-trills and straw/buzz in water pt's voice quality improved - pt rated her voice 7/10. Homework for straw/buzz and trills no less than 10 minutes, at least x3/day.  01/04/23: Enters today with breathy, mod hoarse voice but less-strained than previous sessions. Pitch still appears lower than normal based upon videos from fall 2023. Pt has practiced AB for 13-15 minutes BID and has "caught herself" with AB during the day.  SLP attempted pitch glides hi-lo but pt had much difficulty with achieving pitch variation.Marland Kitchen SOVTE was more successful today in water than in previous session, as she was able to feel oral vibrations with vocalization in water after SLP shaped (straw with buzz, then enter water after 3-4 seconds). SLP told pt to perform buzz outside water for 5 minutes, then enter water after 3-4 seconds for 5 minutes.   12/28/22: Pt enters with breathy, strained voice, lower than average female pitch, comparable to previous session. 3 minutes AB resulted in no feeling like Thelma had to cough or throat clear. Immediately after AB completed, pt coughed. SLP wonders about cyclical cough/vocal cord dysfunction as contributing factor to pt's hoarseness, as pt's feeling of discomfort in her throat was alleviated by AB. Pt voice quality also improved to 4/10 from 2-3/10 when walking back to ST room. SLP initiated SOVTE tasks and pt with success blowing into straw, and blowing into water with straw. She was surprised to hear her vocal quality improve further to 5/10 after straw-water blowing. SLP attempted to have pt with flow phonation task of breathe/vocalize into straw but pt with tightness inhibiting vocalization so SLP worked with pt in "vocal waterfalls" ("oooooooo" from high pitch to low pitch) - first 3 were with limited range but the last 3 were with much wider pitch range and better quality. SLP told pt to do waterfalls after straw blowing in water for 5 minutes. She used compensations to limit  coughing/throat clearing to 4 during today's session.  12/23/22: Pt enters with breathy, strained voice, lower than average female pitch, comparable to previous session. SLP had pt perform AB and she req'd mod cues from SLP for abdominal movement and not thoracic movement. Pt did not spontaneously change movement without SLP assistance so thus SLP wonders how successful pt practice has been at home. Pt performed AB again, briefly, shortly after initial practice, correctly. SLP asked pt to hear her WNL voice and SLP was directed to youTube and pt's WNL voice was mildly hoarse, with some vocal fry evident occasionally at ends of utterances. SLP reminded pt four times during session for smooth and gentle voice, and rationale for this. Additionally, pt coughed 5 during session feeling like she needed to clear something from pharynx. SLP encouraged pt to bring water next time as an alternative to coughing. When pt practiced AB, no coughs observed. SLP needs to ensure pt with correct procedure with AB prior to moving into straw exercises.   12/20/32: Pt to learn straw phonation next session in order to decr vocal/laryngeal tension. Pt relates to SLP that she noticed by the end of a ten minute conversation that her "voice got squeaky." Pt asks SLP about comedian she is seeing Thursday night and a concert she is going to next week and asked SLP about voice  hygiene in these areas. She has not cleared throat >4 times since last session, but has compensated with one sharp cough periodically, which she reports "brings on another cough." SLP wonders about vocal cord dysfuction vs. Vocal hypersenstivity. SLP taught pt about abdominal breathing (AB) and pt successful at rest after approx 30 seconds of SLP guidance.   12/15/22: (eval) SLP educated pt about what vocal nodules are and how they form, the downward cyclical nature of throat clearing, why pt might feel like she has "bits of cotton" in her throat, and alternatives to  throat clearing. SLP discussed program of vocal hygiene, and her focus until next session - limit throat clearing and coughing, and talk in a soft smooth and gentle voice.   PATIENT EDUCATION: Education details: See "Today's Treatment" Person educated: Patient Education method: Explanation, Demonstration, Verbal cues, and Handouts Education comprehension: verbalized understanding, returned demonstration, verbal cues required, and needs further education  HOME EXERCISE PROGRAM: Vocal hygiene  GOALS: Goals reviewed with patient? Yes  SHORT TERM GOALS: Target date: 01/14/23  Pt will track at least one aspect of vocal hygiene between 5 sessions Baseline: 12/23/22, 01/06/23, 01/11/23, 01/13/23 Goal status: Partially met  2.  Pt will use throat clear alternatives to reduce clears to <5 in 3 sessions Baseline: 12/28/22, 01/04/23 Goal status: Met  3.  Pt will demo abdominal breathing (AB) 80% at rest for 5 minutes x2 sessions Baseline: 01/04/23 Goal status: Met  4.  Pt will tell SLP she has used modifications for voice use each day between 3 sessions Baseline: 01/04/23, 01-11-23 Goal status: Met  5.  Pt will complete vocal relaxation tasks with rare min A in 3 sessions Baseline: 01/11/23, 01/13/23 Goal status: partially met   LONG TERM GOALS: Target date: 7//24  Pt will score better on VHI and/or VR-QOL than initial score on eval date Baseline:  Goal status: Met  2.  Pt will complete vocal relaxation tasks with modified indpendence in 3 sessions Baseline: 02/01/23, 02/17/23 Goal status: Met  3.  Pt will demo abdominal breathing (AB) 80% in sentence responses x2 sessions Baseline: 02/01/23 Goal status: Met  4.  Pt will demo abdominal breathing (AB) 80% in 7 minutes conversation x3 sessions Baseline: 02/17/23, 03/04/23 Goal status: Ongoing  5.  Pt will use alternatives to throat clearing and/or demo no more than 2 clears in 3 consecutive sessions Baseline: 02/17/23 Goal status:  Ongoing   ASSESSMENT:  CLINICAL IMPRESSION: RECERT TODAY. Patient is a 67 y.o. female who was seen today for treatment of voice. She has made phenomenal gains with her vocal quality. SEE TX NOTE. 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 See "today's treatment" for more details.  OBJECTIVE IMPAIRMENTS: include voice disorder. These impairments are limiting patient from return to work, household responsibilities, ADLs/IADLs, and effectively communicating at home and in community. Factors affecting potential to achieve goals and functional outcome are severity of impairments.. Patient will benefit from skilled SLP services to address above impairments and improve overall function.  REHAB POTENTIAL: Good  PLAN:  SLP FREQUENCY: 1x/week  SLP DURATION: 8 weeks or 17 total sessions  PLANNED INTERVENTIONS: Environmental controls, Internal/external aids, Functional tasks, SLP instruction and feedback, Compensatory strategies, Patient/family education, and voice exercises PRN    Jazleen Robeck, CCC-SLP 03/04/2023, 5:19 PM

## 2023-03-17 ENCOUNTER — Other Ambulatory Visit: Payer: Self-pay

## 2023-03-17 MED ORDER — ROSUVASTATIN CALCIUM 20 MG PO TABS: 20.0000 mg | ORAL_TABLET | Freq: Every day | ORAL | 0 refills | Status: DC

## 2023-03-28 DIAGNOSIS — G4733 Obstructive sleep apnea (adult) (pediatric): Secondary | ICD-10-CM | POA: Diagnosis not present

## 2023-03-30 DIAGNOSIS — F331 Major depressive disorder, recurrent, moderate: Secondary | ICD-10-CM | POA: Diagnosis not present

## 2023-03-30 DIAGNOSIS — F900 Attention-deficit hyperactivity disorder, predominantly inattentive type: Secondary | ICD-10-CM | POA: Diagnosis not present

## 2023-03-30 DIAGNOSIS — F411 Generalized anxiety disorder: Secondary | ICD-10-CM | POA: Diagnosis not present

## 2023-04-02 DIAGNOSIS — G4733 Obstructive sleep apnea (adult) (pediatric): Secondary | ICD-10-CM | POA: Diagnosis not present

## 2023-04-06 ENCOUNTER — Ambulatory Visit: Payer: BC Managed Care – PPO | Attending: Unknown Physician Specialty

## 2023-04-06 DIAGNOSIS — R490 Dysphonia: Secondary | ICD-10-CM | POA: Insufficient documentation

## 2023-04-06 NOTE — Therapy (Signed)
OUTPATIENT SPEECH LANGUAGE PATHOLOGY VOICE TREATMENT/DISCHARGE   Patient Name: Terri [name] MRN: [id number] DOB:1956/03/08, 67 y.o., female Today's Date: 04/06/2023  PCP: Shirlean Mylar, MD  REFERRING PROVIDER: Linus Salmons, MD   END OF SESSION:  End of Session - 04/06/23 1136     Visit Number 14    Number of Visits 17    Date for SLP Re-Evaluation 04/18/23    SLP Start Time 1106    SLP Stop Time  1146    SLP Time Calculation (min) 40 min    Activity Tolerance Patient tolerated treatment well                 Past Medical History:  Diagnosis Date   ADD (attention deficit disorder)    Allergy    seasonal   Arthritis    Asthma    mild   Blood transfusion without reported diagnosis    Cataract    Complication of anesthesia    can be difficult to wake up/need prompts to breathe   GERD (gastroesophageal reflux disease)    Barrett's   NAUSEA AND VOMITING 04/15/2010   Obesity    Pneumonia    Sleep apnea    Uses c pap, but sleep apnea resolved after surgery   Past Surgical History:  Procedure Laterality Date   ABDOMINAL DEBRIDEMENT  05/26/2012   ABDOMINOPLASTY  01/2009   ADENOIDECTOMY     COLONOSCOPY     ESOPHAGEAL MANOMETRY N/A 11/08/2019   Procedure: ESOPHAGEAL MANOMETRY (EM);  Surgeon: Napoleon Form, MD;  Location: [facility];  Service: Endoscopy;  Laterality: N/A;   KNEE ARTHROSCOPY     left   LAPAROSCOPIC GASTRIC BANDING     05/2004   POLYPECTOMY     post polyp bleed   seroma     TONSILLECTOMY     TOTAL KNEE ARTHROPLASTY Left 12/02/2020   Procedure: TOTAL KNEE ARTHROPLASTY;  Surgeon: Ollen Gross, MD;  Location: [facility];  Service: Orthopedics;  Laterality: Left;    UPPER GASTROINTESTINAL ENDOSCOPY     Barrett's   UVULOPALATOPHARYNGOPLASTY     Patient Active Problem List   Diagnosis Date Noted   OA (osteoarthritis) of knee 12/02/2020   Osteoarthritis of left knee 12/02/2020   Esophageal dysphagia    Left leg weakness 04/01/2016   Left  leg paresthesias 04/01/2016   Noninfected postoperative seroma, lower anterior abdominal wall 04/14/2012   History of laparoscopic adjustable gastric banding, 06/16/2004. 12/01/2011   GERD 04/15/2010   NAUSEA AND VOMITING 04/15/2010   DYSPHAGIA 04/15/2010   BARIATRIC SURGERY STATUS 04/15/2010   SPEECH THERAPY DISCHARGE SUMMARY  Visits from Start of Care: 14  Current functional level related to goals / functional outcomes: "You helped me get my life back," pt stated as SLP and pt walked back to ST room. Pt with WNL voice quality.See below   Remaining deficits: None   Education / Equipment: See "today's therapy" section and "education" below   Patient agrees to discharge. Patient goals were partially met. Patient is being discharged due to meeting the stated rehab goals..    Onset date: January 2024  REFERRING DIAG: R49.0 (ICD-10-CM) - Dysphonia   THERAPY DIAG:  Hoarseness  Rationale for Evaluation and Treatment: Rehabilitation  SUBJECTIVE:   SUBJECTIVE STATEMENT: "Thank you so much!" Pt accompanied by: self  PERTINENT HISTORY: Pt has hx of abnormal ECG, dyspnea on exertion, and chest pain at rest.  Has history of obstructive sleep apnea, ADD, GERD, and obesity. Hoarseness began after congestion from  an upper respiratory illness.ENT eval stated voice is "mod hoarse, breathy, gravelly, loss of voice, raspy, strained, weak. 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   PAIN:  Are you having pain? No  FALLS: Has patient fallen in last 6 months? No  PATIENT GOALS: Improve vocal function in order to return fully to work  OBJECTIVE:    PATIENT  REPORTED OUTCOME MEASURES (PROM): V-RQOL: 7.5 (poor - worst possible), with "poor" quality in last two weeks, and VHI: 117 ("severe")  TODAY'S TREATMENT:                                                                                                                                         DATE:  Abdominal breathing (AB), SOVTE (Semi-occluded voice treatment exercises)  04/06/23: [name] cleared her throat twice today in session. She entered with WNL vocal quality, and remained so throughout the 30-minute conversation about a myriad of topics. Periodically, Saniyah's volume would increase and 4/5 opportunities she independently decr'd her volume again. Pt thanked SLP x3 during session.   03/04/23: Pt entered with WNL voice quality. She described two situations where she used compensatory strategies for maintaining softer voice. During communication today she used AB 80%. Manika cleared her throat 5 times today. SLP and pt agreed d/c will likely occur next session if her success with voice continues until then.   02/17/23: Pt did not scream at the concert. She is using flow phonation centering techniques/phrases to relax her voice throughout the day. She is thinking about how to modify her shouting at baseball games and will purchase noisemaker to conserve her voice. SLP educated pt to think of her voice as a full checking account at the start of the day and each time she talks she writes a check and if she has an event in the evening she will need to conserve her voice through the rest of the day. Pt was very appreciative for ST thus far and will return in two weeks, then in 4 weeks to wean therapy visits. She spoke with AB in conversation today. She reported using throat clearing alternatives since last session.  VRQOL completed today with a raw score of 13 (good to excellent) and a VRQOL score of 87 (good-excellent), improved from 7.5 (or "worst possible"). VHI also completed today with a score of 10, improved from  117.  02/01/23: Today pt arrived with mild intermittent hoarseness with rating voice a 7-8/10. She wore her voice amplifier for 3 of the 5 [place],  working for 11-12 hours/day. She performed relaxation exercises when she could but this was not often enough she stated. 10 minutes into session SLP told pt to focus on AB and scan/relaxation strategies for 4 minutes. Afterwards, no hoarseness heard and pt with "a solid 8/10" self rated. SLP rated pt's voice 8-9/10; This was the best quality pt had since initiation of therapy and SLP told pt this. SLP strongly encouraged pt to perform relaxation 8-10/day in order to habitualize smooth and gentle voicing and pt agreed. SLP reinforced this by telling pt she needed to "care for her instrument (voice)" - pt again agreed. SLP and pt both thought pt could reduce to once/week if she can maintain scan/release of tension x8-10/day.  01/21/23: Pt saw ENT yesterday - nodules have decr'd in size, per pt. Pt also said ENT stated laryngeal area generally looks better than initial appt.  Today pt entered with 85-90% WNL quality. When SLP noted pt starting slightly pressed voice, asked pt to scan for tension and pt able to regain WNL quality. Played video from pt from July 2023 and voice sounded 95% alike to pt's voice in video (July 2023). SLP reiterated to pt in the next week to ensure she keeps WNL quality to scan multiple times/day for tension and release, perform "centering" phrase for flow/WNL voicing "moomoo's news".SLP also encouarged pt to use her compensations for shouting (clapping) and to use amplifier.   4/23/24[name] arrived speaking with higher volume than confidential voice volume - when she reported on confidential voice she reduced her volume. Yarenis benefitted again from the concept that confidential voice is like putting her voice in a cast - she can still use it but at less capacity than WNL. SLP needed to remind Jazminn x4 during session to  reduce volume back to confidential tone.  SLP used flow phonation and SOVTE tasks to have pt feel relaxed voice without strain or tension after 4 minutes of AB practice at rest. SLP educated pt about flow phonation homework - feeling her "relaxed voice" after performing straw vocalization in water. Pt repeated flow phrases (see "pt instructions") with 85-90% WNL voice quality. Mccayla cleared/coughed x4 today. Shantina has begun to think ahead for next week when she will be lead on a photo shoot out of town. She is using her voice amplifier so that she can cont to use softer volume. SLP showed Shaana some alternatives to shouting - clapping, whistling, etc, for which she thanked SLP.  01/13/23: [name] rates her voice 7/10 today. Pt c/o s/sx VCD today (wanting to throat clear, hoarseness after 10 minutes of conversation). SLP reminded pt that once in ST when she focused on AB for an extended time this feeling subsided.  Pt practiced for 8 minutes twice yesterday. SLP explained rationale for 15 minutes BID and suggested no < 8 minute practice sessions with water and straw and to complete 4 of these 8-minute sessions if this was her desire (8 minutes).  She and SLP then went outdoors to practice confidential voice and pt maintained softer voice in 12 minutes conversation. Approx 8 minutes into conversation pt remarked that her voice was hoarse and SLP told pt to focus on AB and visualization. No hoarseness heard after that. SLP encouraged pt to cont to perform these techniques. Three throat clears today, SLP had to cue pt for using alternatives to actively clear (tapping vocal folds, gentle "mmm mmm mmm", silent cough, etc)  01/11/23: Pt rates voice today 7/10. SLP hears much less  frequent pitch breaks today than in last sessions, however pt's voice demonstrated fatigue by 38-40 minutes - SLP educated pt she should work for 20 minutes 3-4 times/day with straw buzz in water 15x and then lip trills with pitch variation.   Today when [name] did this (straw water buzz/bubbles x10 followed by lip trills with pitch variation) her voice was WNL - SLP rated 8-9/10.  Katniss arrived with louder voice - SLP reminded her about confidential voice "like putting your voice in a cast" temporarily. She req'd min A throughout the session to remain in confidential voice.   01/06/23: Rates her voice today 5-6/10. Pt has been doing voice rest, is limiting throat clearing. SLP educated pt on confidential voice today and stressed this throughout session; SLP told pt she should strive to have this level of vocalization when she speaks, if she speaks. SOVTE today with buzz/straw in water with 88% success, req'd initial cues for using abdominal musculature. Pt performed trills with 85% success. Post-trills and straw/buzz in water pt's voice quality improved - pt rated her voice 7/10. Homework for straw/buzz and trills no less than 10 minutes, at least x3/day.  01/04/23: Enters today with breathy, mod hoarse voice but less-strained than previous sessions. Pitch still appears lower than normal based upon videos from fall 2023. Pt has practiced AB for 13-15 minutes BID and has "caught herself" with AB during the day.  SLP attempted pitch glides hi-lo but pt had much difficulty with achieving pitch variation.Marland Kitchen SOVTE was more successful today in water than in previous session, as she was able to feel oral vibrations with vocalization in water after SLP shaped (straw with buzz, then enter water after 3-4 seconds). SLP told pt to perform buzz outside water for 5 minutes, then enter water after 3-4 seconds for 5 minutes.   12/28/22: Pt enters with breathy, strained voice, lower than average female pitch, comparable to previous session. 3 minutes AB resulted in no feeling like [place] had to cough or throat clear. Immediately after AB completed, pt coughed. SLP wonders about cyclical cough/vocal cord dysfunction as contributing factor to pt's hoarseness, as pt's  feeling of discomfort in her throat was alleviated by AB. Pt voice quality also improved to 4/10 from 2-3/10 when walking back to ST room. SLP initiated SOVTE tasks and pt with success blowing into straw, and blowing into water with straw. She was surprised to hear her vocal quality improve further to 5/10 after straw-water blowing. SLP attempted to have pt with flow phonation task of breathe/vocalize into straw but pt with tightness inhibiting vocalization so SLP worked with pt in "vocal waterfalls" ("oooooooo" from high pitch to low pitch) - first 3 were with limited range but the last 3 were with much wider pitch range and better quality. SLP told pt to do waterfalls after straw blowing in water for 5 minutes. She used compensations to limit coughing/throat clearing to 4 during today's session.  12/23/22: Pt enters with breathy, strained voice, lower than average female pitch, comparable to previous session. SLP had pt perform AB and she req'd mod cues from SLP for abdominal movement and not thoracic movement. Pt did not spontaneously change movement without SLP assistance so thus SLP wonders how successful pt practice has been at home. Pt performed AB again, briefly, shortly after initial practice, correctly. SLP asked pt to hear her WNL voice and SLP was directed to youTube and pt's WNL voice was mildly hoarse, with some vocal fry evident occasionally at ends of  utterances. SLP reminded pt four times during session for smooth and gentle voice, and rationale for this. Additionally, pt coughed 5 during session feeling like she needed to clear something from pharynx. SLP encouraged pt to bring water next time as an alternative to coughing. When pt practiced AB, no coughs observed. SLP needs to ensure pt with correct procedure with AB prior to moving into straw exercises.   12/20/32: Pt to learn straw phonation next session in order to decr vocal/laryngeal tension. Pt relates to SLP that she noticed by the end  of a ten minute conversation that her "voice got squeaky." Pt asks SLP about comedian she is seeing Thursday night and a concert she is going to next week and asked SLP about voice hygiene in these areas. She has not cleared throat >4 times since last session, but has compensated with one sharp cough periodically, which she reports "brings on another cough." SLP wonders about vocal cord dysfuction vs. Vocal hypersenstivity. SLP taught pt about abdominal breathing (AB) and pt successful at rest after approx 30 seconds of SLP guidance.   12/15/22: (eval) SLP educated pt about what vocal nodules are and how they form, the downward cyclical nature of throat clearing, why pt might feel like she has "bits of cotton" in her throat, and alternatives to throat clearing. SLP discussed program of vocal hygiene, and her focus until next session - limit throat clearing and coughing, and talk in a soft smooth and gentle voice.   PATIENT EDUCATION: Education details: See "Today's Treatment" Person educated: Patient Education method: Explanation, Demonstration, Verbal cues, and Handouts Education comprehension: verbalized understanding, returned demonstration, verbal cues required, and needs further education  HOME EXERCISE PROGRAM: Vocal hygiene  GOALS: Goals reviewed with patient? Yes  SHORT TERM GOALS: Target date: 01/14/23  Pt will track at least one aspect of vocal hygiene between 5 sessions Baseline: 12/23/22, 01/06/23, 01/11/23, 01/13/23 Goal status: Partially met  2.  Pt will use throat clear alternatives to reduce clears to <5 in 3 sessions Baseline: 12/28/22, 01/04/23 Goal status: Met  3.  Pt will demo abdominal breathing (AB) 80% at rest for 5 minutes x2 sessions Baseline: 01/04/23 Goal status: Met  4.  Pt will tell SLP she has used modifications for voice use each day between 3 sessions Baseline: 01/04/23, 01-11-23 Goal status: Met  5.  Pt will complete vocal relaxation tasks with rare min A in 3  sessions Baseline: 01/11/23, 01/13/23 Goal status: partially met   LONG TERM GOALS: Target date: 04/18/23  Pt will score better on VHI and/or VR-QOL than initial score on eval date Baseline:  Goal status: Met  2.  Pt will complete vocal relaxation tasks with modified indpendence in 3 sessions Baseline: 02/01/23, 02/17/23 Goal status: Met  3.  Pt will demo abdominal breathing (AB) 80% in sentence responses x2 sessions Baseline: 02/01/23 Goal status: Met  4.  Pt will demo abdominal breathing (AB) 80% in 7 minutes conversation x3 sessions Baseline: 02/17/23, 03/04/23 Goal status: Met  5.  Pt will use alternatives to throat clearing and/or demo no more than 2 clears in 3 consecutive sessions Baseline: 02/17/23, 04/06/23 Goal status: Partially met   ASSESSMENT:  CLINICAL IMPRESSION: RECERT TODAY. Patient is a 67 y.o. female who was seen today for treatment of voice. She has made phenomenal gains with her vocal quality. SEE TX NOTE. 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 See "today's treatment" for more details.  OBJECTIVE IMPAIRMENTS: include voice disorder. These impairments are limiting patient from return to work, household responsibilities, ADLs/IADLs, and effectively communicating at home and in community. Factors affecting potential to achieve goals and functional outcome are severity of impairments.. Patient will benefit from skilled SLP services to address above impairments and improve overall function.  REHAB POTENTIAL: Good  PLAN:  SLP FREQUENCY: 1x/week  SLP DURATION: 8 weeks or 17 total sessions  PLANNED INTERVENTIONS: Environmental controls, Internal/external aids, Functional tasks, SLP instruction and feedback, Compensatory strategies, Patient/family education, and voice exercises  PRN    Laban Orourke, CCC-SLP 04/06/2023, 11:47 AM

## 2023-04-14 ENCOUNTER — Other Ambulatory Visit: Payer: Self-pay | Admitting: Internal Medicine

## 2023-04-21 ENCOUNTER — Other Ambulatory Visit: Payer: Self-pay | Admitting: Internal Medicine

## 2023-04-23 ENCOUNTER — Ambulatory Visit (HOSPITAL_BASED_OUTPATIENT_CLINIC_OR_DEPARTMENT_OTHER): Payer: BC Managed Care – PPO | Admitting: Internal Medicine

## 2023-04-23 ENCOUNTER — Encounter (HOSPITAL_BASED_OUTPATIENT_CLINIC_OR_DEPARTMENT_OTHER): Payer: Self-pay | Admitting: Internal Medicine

## 2023-04-23 ENCOUNTER — Telehealth: Payer: Self-pay | Admitting: Internal Medicine

## 2023-04-23 VITALS — BP 114/74 | HR 80 | Ht 66.0 in | Wt 247.0 lb

## 2023-04-23 DIAGNOSIS — E785 Hyperlipidemia, unspecified: Secondary | ICD-10-CM

## 2023-04-23 DIAGNOSIS — I2510 Atherosclerotic heart disease of native coronary artery without angina pectoris: Secondary | ICD-10-CM

## 2023-04-23 DIAGNOSIS — I2584 Coronary atherosclerosis due to calcified coronary lesion: Secondary | ICD-10-CM | POA: Diagnosis not present

## 2023-04-23 NOTE — Progress Notes (Signed)
LIPID CLINIC CONSULT NOTE  Chief Complaint:  Establish cardiologist  Primary Care Physician: Terri Mylar, MD  Primary Cardiologist:  Terri Nose, MD  HPI:  Terri [name] is a 67 y.o. female who is being seen today for the evaluation of cardiovascular risk at the request of Terri Mylar, MD. this is a pleasant 67 year old female who was previously seen Dr. Katrinka [name] but has not established with a new cardiologist.  She recently called in to refill her statin and was given a short 1 week supply.  She initially saw Dr. Katrinka [name] for an abnormal EKG and what was considered to be atypical chest pain.  She had a coronary calcium score in July 2023 which was 196.1, 71st percentile for age and sex matched controls.  There is a history of heart disease in the family.  Subsequently because of some shortness of breath and other complaints she underwent an echocardiogram.  This showed a normal EF 60 to 65% with normal diastolic function and no significant valve disease.  Additionally she had a [place] Myoview about a month later which was negative for ischemia.  She reports since then that her symptoms have improved.  She denies any exertional dyspnea or chest pain.  She has not had any recent labs but her lipid profile back in September showed total cholesterol 159, triglycerides 131, HDL 75 and LDL 62.  This would be at target.  She reports she has upcoming lab work and a visit with her primary care provider next week.  PMHx:  Past Medical History:  Diagnosis Date   ADD (attention deficit disorder)    Allergy    seasonal   Arthritis    Asthma    mild   Blood transfusion without reported diagnosis    Cataract    Complication of anesthesia    can be difficult to wake up/need prompts to breathe   GERD (gastroesophageal reflux disease)    Barrett's   NAUSEA AND VOMITING 04/15/2010   Obesity    Pneumonia    Sleep apnea    Uses c pap, but sleep apnea resolved after surgery    Past Surgical  History:  Procedure Laterality Date   ABDOMINAL DEBRIDEMENT  05/26/2012   ABDOMINOPLASTY  01/2009   ADENOIDECTOMY     COLONOSCOPY     ESOPHAGEAL MANOMETRY N/A 11/08/2019   Procedure: ESOPHAGEAL MANOMETRY (EM);  Surgeon: Terri Form, MD;  Location: [facility];  Service: Endoscopy;  Laterality: N/A;   KNEE ARTHROSCOPY     left   LAPAROSCOPIC GASTRIC BANDING     05/2004   POLYPECTOMY     post polyp bleed   seroma     TONSILLECTOMY     TOTAL KNEE ARTHROPLASTY Left 12/02/2020   Procedure: TOTAL KNEE ARTHROPLASTY;  Surgeon: Terri Gross, MD;  Location: [facility];  Service: Orthopedics;  Laterality: Left;    UPPER GASTROINTESTINAL ENDOSCOPY     Barrett's   UVULOPALATOPHARYNGOPLASTY      FAMHx:  Family History  Problem Relation Age of Onset   Diabetes Mother    Depression Mother    Heart attack Mother    Heart Problems Father    Esophageal cancer Paternal Uncle    Colon cancer Neg Hx    Stomach cancer Neg Hx    Stroke Neg Hx    Neuropathy Neg Hx    Rectal cancer Neg Hx     SOCHx:   reports that she quit smoking about 29 years ago. Her smoking  use included cigarettes. She has never used smokeless tobacco. She reports current alcohol use. She reports that she does not use drugs.  ALLERGIES:  No Known Allergies  ROS: Pertinent items noted in HPI and remainder of comprehensive ROS otherwise negative.  HOME MEDS: Current Outpatient Medications on File Prior to Visit  Medication Sig Dispense Refill   ADVAIR DISKUS 250-50 MCG/DOSE AEPB Inhale 1 puff into the lungs 2 (two) times daily.     albuterol (VENTOLIN HFA) 108 (90 Base) MCG/ACT inhaler Inhale 2 puffs into the lungs as needed.     Azelastine HCl 0.15 % SOLN Place 1 spray into the [name] daily.     escitalopram (LEXAPRO) 20 MG tablet Take 20 mg by mouth daily.     fluticasone (FLONASE) 50 MCG/ACT nasal spray Place 1-2 sprays into both nostrils daily.     levalbuterol (XOPENEX HFA) 45 MCG/ACT inhaler Inhale 1-2  puffs into the lungs every 6 (six) hours as needed for shortness of breath or wheezing.     levocetirizine (XYZAL) 5 MG tablet Take 2.5 mg by mouth every evening.     lisdexamfetamine (VYVANSE) 60 MG capsule Take 60 mg by mouth every morning.     montelukast (SINGULAIR) 10 MG tablet Take 10 mg by mouth every evening.      omeprazole (PRILOSEC) 40 MG capsule Take 1 capsule (40 mg total) by mouth 2 (two) times daily. 60 capsule 11   rosuvastatin (CRESTOR) 20 MG tablet Take 1 tablet (20 mg total) by mouth daily. Please call office to schedule an appt for further refills. Thank you 7 tablet 0   zolpidem (AMBIEN) 10 MG tablet Take 10 mg by mouth at bedtime as needed for sleep. For sleep     No current facility-administered medications on file prior to visit.    LABS/IMAGING: No results found for this or any previous visit (from the past 48 hour(s)). No results found.  LIPID PANEL:    Component Value Date/Time   CHOL 159 06/10/2022 0836   TRIG 131 06/10/2022 0836   HDL 75 06/10/2022 0836   CHOLHDL 2.1 06/10/2022 0836   LDLCALC 62 06/10/2022 0836    WEIGHTS: Wt Readings from Last 3 Encounters:  04/23/23 247 lb (112 kg)  05/18/22 241 lb (109.3 kg)  04/03/22 241 lb 6.4 oz (109.5 kg)    VITALS: BP 114/74   Pulse 80   Ht 5\' 6"  (1.676 m)   Wt 247 lb (112 kg)   LMP 09/16/2012   SpO2 97%   BMI 39.87 kg/m   EXAM: General appearance: alert and no distress Neck: no carotid bruit, no JVD, and thyroid not enlarged, symmetric, no tenderness/mass/nodules Lungs: clear to auscultation bilaterally Heart: regular rate and rhythm, S1, S2 normal, no murmur, click, rub or gallop Abdomen: soft, non-tender; bowel sounds normal; no masses,  no organomegaly Extremities: extremities normal, atraumatic, no cyanosis or edema Pulses: 2+ and symmetric Skin: Skin color, texture, turgor normal. No rashes or lesions Neurologic: Grossly normal Psych: Pleasant  EKG: Deferred  ASSESSMENT: Mixed  dyslipidemia, goal LDL less than 70 CAC score of 196.1, 71st percentile for age and sex matched controls  PLAN: 1.   Terri [name] is doing well without any current symptoms.  She did have some coronary calcification but is on statin therapy and at target less than 70 for LDL.  I would advise obtaining an LP(a).  She can have this drawn at her primary care's office since she has follow-up and lab work next  week.  Ultimately she might be a candidate for specific LP(a) therapy if it is elevated.  We will renew her statin medication for 1 year and plan to see her back annually or sooner as necessary.  Terri Nose, MD, [facility], FACP  [place]  [facility]  Medical Director of the Advanced Lipid Disorders &  Cardiovascular Risk Reduction Clinic Diplomate of the American Board of Clinical Lipidology Attending Cardiologist  Direct Dial: [phone]  Fax: [phone]  Website:  [email or web address]  Lisette Abu Basel Defalco 04/23/2023, 2:54 PM

## 2023-04-23 NOTE — Telephone Encounter (Signed)
Scheduled patient to see Dr. Rennis Golden this afternoon at 1:30 pm

## 2023-04-23 NOTE — Patient Instructions (Signed)
Medication Instructions:  Continue current medication  *If you need a refill on your cardiac medications before your next appointment, please call your pharmacy*   Lab Work: Have for primary care provider check an LP(a) for follow-up labs   Testing/Procedures: None Ordered   Follow-Up: At [facility], you and your health needs are our priority.  As part of our continuing mission to provide you with exceptional heart care, we have created designated Provider Care Teams.  These Care Teams include your primary Cardiologist (physician) and Advanced Practice Providers (APPs -  Physician Assistants and Nurse Practitioners) who all work together to provide you with the care you need, when you need it.  We recommend signing up for the patient portal called "MyChart".  Sign up information is provided on this After Visit Summary.  MyChart is used to connect with patients for Virtual Visits (Telemedicine).  Patients are able to view lab/test results, encounter notes, upcoming appointments, etc.  Non-urgent messages can be sent to your provider as well.   To learn more about what you can do with MyChart, go to [email or web address].    Your next appointment:   1 year(s)  Provider:   K. Italy Hilty, MD

## 2023-04-27 DIAGNOSIS — G4733 Obstructive sleep apnea (adult) (pediatric): Secondary | ICD-10-CM | POA: Diagnosis not present

## 2023-04-30 ENCOUNTER — Other Ambulatory Visit: Payer: Self-pay | Admitting: Internal Medicine

## 2023-04-30 NOTE — Telephone Encounter (Signed)
Patient is calling back to request this med again due to be going out of town today and waiting on this medication. Please advise.

## 2023-04-30 NOTE — Telephone Encounter (Signed)
Patient is calling requesting this be placed as high priority due to her being out.

## 2023-05-12 ENCOUNTER — Other Ambulatory Visit (HOSPITAL_COMMUNITY)
Admission: RE | Admit: 2023-05-12 | Discharge: 2023-05-12 | Disposition: A | Discharge status: Home / Self Care | Payer: BC Managed Care – PPO | Source: Ambulatory Visit | Attending: Family Medicine | Admitting: Family Medicine

## 2023-05-12 ENCOUNTER — Other Ambulatory Visit: Payer: Self-pay | Admitting: Family Medicine

## 2023-05-12 DIAGNOSIS — Z01419 Encounter for gynecological examination (general) (routine) without abnormal findings: Secondary | ICD-10-CM | POA: Insufficient documentation

## 2023-05-12 DIAGNOSIS — E785 Hyperlipidemia, unspecified: Secondary | ICD-10-CM | POA: Diagnosis not present

## 2023-05-12 DIAGNOSIS — Z23 Encounter for immunization: Secondary | ICD-10-CM | POA: Diagnosis not present

## 2023-05-12 DIAGNOSIS — Z0000 Encounter for general adult medical examination without abnormal findings: Secondary | ICD-10-CM | POA: Diagnosis not present

## 2023-05-12 DIAGNOSIS — R739 Hyperglycemia, unspecified: Secondary | ICD-10-CM | POA: Diagnosis not present

## 2023-05-12 DIAGNOSIS — K2270 Barrett's esophagus without dysplasia: Secondary | ICD-10-CM | POA: Diagnosis not present

## 2023-05-12 DIAGNOSIS — Z6841 Body Mass Index (BMI) 40.0 and over, adult: Secondary | ICD-10-CM | POA: Diagnosis not present

## 2023-05-12 DIAGNOSIS — Z87891 Personal history of nicotine dependence: Secondary | ICD-10-CM | POA: Diagnosis not present

## 2023-05-13 LAB — LAB REPORT - SCANNED
A1c: 5.8
EGFR: 100

## 2023-05-17 LAB — CYTOLOGY - PAP
Comment: NEGATIVE
Diagnosis: NEGATIVE
High risk HPV: NEGATIVE

## 2023-05-26 DIAGNOSIS — F331 Major depressive disorder, recurrent, moderate: Secondary | ICD-10-CM | POA: Diagnosis not present

## 2023-05-26 DIAGNOSIS — F900 Attention-deficit hyperactivity disorder, predominantly inattentive type: Secondary | ICD-10-CM | POA: Diagnosis not present

## 2023-05-26 DIAGNOSIS — F411 Generalized anxiety disorder: Secondary | ICD-10-CM | POA: Diagnosis not present

## 2023-05-28 DIAGNOSIS — G4733 Obstructive sleep apnea (adult) (pediatric): Secondary | ICD-10-CM | POA: Diagnosis not present

## 2023-06-01 DIAGNOSIS — R051 Acute cough: Secondary | ICD-10-CM | POA: Diagnosis not present

## 2023-06-01 DIAGNOSIS — U071 COVID-19: Secondary | ICD-10-CM | POA: Diagnosis not present

## 2023-06-08 ENCOUNTER — Encounter: Payer: Self-pay | Admitting: Gastroenterology

## 2023-06-08 DIAGNOSIS — U071 COVID-19: Secondary | ICD-10-CM | POA: Diagnosis not present

## 2023-06-08 DIAGNOSIS — R053 Chronic cough: Secondary | ICD-10-CM | POA: Diagnosis not present

## 2023-06-26 DIAGNOSIS — G4733 Obstructive sleep apnea (adult) (pediatric): Secondary | ICD-10-CM | POA: Diagnosis not present

## 2023-07-16 ENCOUNTER — Ambulatory Visit (AMBULATORY_SURGERY_CENTER): Payer: BC Managed Care – PPO

## 2023-07-16 VITALS — Ht 66.0 in | Wt 245.0 lb

## 2023-07-16 DIAGNOSIS — M778 Other enthesopathies, not elsewhere classified: Secondary | ICD-10-CM | POA: Diagnosis not present

## 2023-07-16 DIAGNOSIS — Z1211 Encounter for screening for malignant neoplasm of colon: Secondary | ICD-10-CM

## 2023-07-16 DIAGNOSIS — M654 Radial styloid tenosynovitis [de Quervain]: Secondary | ICD-10-CM | POA: Diagnosis not present

## 2023-07-16 MED ORDER — NA SULFATE-K SULFATE-MG SULF 17.5-3.13-1.6 GM/177ML PO SOLN: 1.0000 | Freq: Once | ORAL | 0 refills | Status: AC

## 2023-07-16 NOTE — Progress Notes (Signed)
No egg or soy allergy known to patient  No issues known to pt with past sedation with any surgeries or procedures Patient denies ever being told they had issues or difficulty with intubation  No FH of Malignant Hyperthermia Pt is not on diet pills Pt is not on  home 02  Pt is not on blood thinners  Pt denies issues with constipation  No A fib or A flutter Have any cardiac testing pending--no  LOA: independent  Prep: Suprep   Patient's chart reviewed by Cathlyn Parsons CNRA prior to previsit and patient appropriate for the [facility].  Previsit completed and red dot placed by patient's name on their procedure day (on provider's schedule).     PV competed with patient. Prep instructions sent via mychart and home address. Goodrx coupon for [facility] provided to use for price reduction if needed.

## 2023-07-26 ENCOUNTER — Encounter: Payer: BC Managed Care – PPO | Admitting: Gastroenterology

## 2023-07-26 DIAGNOSIS — G4733 Obstructive sleep apnea (adult) (pediatric): Secondary | ICD-10-CM | POA: Diagnosis not present

## 2023-08-16 DIAGNOSIS — J4530 Mild persistent asthma, uncomplicated: Secondary | ICD-10-CM | POA: Diagnosis not present

## 2023-08-16 DIAGNOSIS — J300 Vasomotor rhinitis: Secondary | ICD-10-CM | POA: Diagnosis not present

## 2023-08-16 DIAGNOSIS — K219 Gastro-esophageal reflux disease without esophagitis: Secondary | ICD-10-CM | POA: Diagnosis not present

## 2023-08-19 DIAGNOSIS — S61319A Laceration without foreign body of unspecified finger with damage to nail, initial encounter: Secondary | ICD-10-CM | POA: Diagnosis not present

## 2023-08-20 ENCOUNTER — Encounter: Payer: Self-pay | Admitting: Gastroenterology

## 2023-08-23 DIAGNOSIS — S61209D Unspecified open wound of unspecified finger without damage to nail, subsequent encounter: Secondary | ICD-10-CM | POA: Diagnosis not present

## 2023-08-23 DIAGNOSIS — Z792 Long term (current) use of antibiotics: Secondary | ICD-10-CM | POA: Diagnosis not present

## 2023-08-24 DIAGNOSIS — F411 Generalized anxiety disorder: Secondary | ICD-10-CM | POA: Diagnosis not present

## 2023-08-24 DIAGNOSIS — F900 Attention-deficit hyperactivity disorder, predominantly inattentive type: Secondary | ICD-10-CM | POA: Diagnosis not present

## 2023-08-24 DIAGNOSIS — F331 Major depressive disorder, recurrent, moderate: Secondary | ICD-10-CM | POA: Diagnosis not present

## 2023-08-25 DIAGNOSIS — L089 Local infection of the skin and subcutaneous tissue, unspecified: Secondary | ICD-10-CM | POA: Diagnosis not present

## 2023-08-25 DIAGNOSIS — S61209S Unspecified open wound of unspecified finger without damage to nail, sequela: Secondary | ICD-10-CM | POA: Diagnosis not present

## 2023-08-26 DIAGNOSIS — G4733 Obstructive sleep apnea (adult) (pediatric): Secondary | ICD-10-CM | POA: Diagnosis not present

## 2023-08-27 ENCOUNTER — Emergency Department (HOSPITAL_BASED_OUTPATIENT_CLINIC_OR_DEPARTMENT_OTHER)
Admission: EM | Admit: 2023-08-27 | Discharge: 2023-08-27 | Disposition: A | Discharge status: Home / Self Care | Payer: BC Managed Care – PPO | Attending: Emergency Medicine | Admitting: Emergency Medicine

## 2023-08-27 ENCOUNTER — Emergency Department (HOSPITAL_BASED_OUTPATIENT_CLINIC_OR_DEPARTMENT_OTHER): Payer: BC Managed Care – PPO

## 2023-08-27 ENCOUNTER — Encounter (HOSPITAL_BASED_OUTPATIENT_CLINIC_OR_DEPARTMENT_OTHER): Payer: Self-pay | Admitting: Emergency Medicine

## 2023-08-27 DIAGNOSIS — J45909 Unspecified asthma, uncomplicated: Secondary | ICD-10-CM | POA: Diagnosis not present

## 2023-08-27 DIAGNOSIS — S61211A Laceration without foreign body of left index finger without damage to nail, initial encounter: Secondary | ICD-10-CM | POA: Diagnosis not present

## 2023-08-27 DIAGNOSIS — I998 Other disorder of circulatory system: Secondary | ICD-10-CM | POA: Diagnosis not present

## 2023-08-27 DIAGNOSIS — M7989 Other specified soft tissue disorders: Secondary | ICD-10-CM | POA: Diagnosis not present

## 2023-08-27 DIAGNOSIS — M62242 Nontraumatic ischemic infarction of muscle, left hand: Secondary | ICD-10-CM | POA: Diagnosis not present

## 2023-08-27 DIAGNOSIS — M79645 Pain in left finger(s): Secondary | ICD-10-CM | POA: Diagnosis not present

## 2023-08-27 DIAGNOSIS — M778 Other enthesopathies, not elsewhere classified: Secondary | ICD-10-CM | POA: Diagnosis not present

## 2023-08-27 HISTORY — DX: Pure hypercholesterolemia, unspecified: E78.00

## 2023-08-27 MED ORDER — MUPIROCIN 2 % EX OINT: 1.0000 | TOPICAL_OINTMENT | Freq: Two times a day (BID) | CUTANEOUS | 0 refills | Status: DC

## 2023-08-27 MED ORDER — MUPIROCIN CALCIUM 2 % EX CREA: 1.0000 | TOPICAL_CREAM | Freq: Two times a day (BID) | CUTANEOUS | 0 refills | Status: DC

## 2023-08-27 MED ORDER — MUPIROCIN CALCIUM 2 % EX CREA: TOPICAL_CREAM | Freq: Once | CUTANEOUS | Status: DC

## 2023-08-27 NOTE — ED Provider Notes (Signed)
[place] EMERGENCY DEPARTMENT AT [facility] Provider Note   CSN: [id number] Arrival date & time: 08/27/23  0865     History  Chief Complaint  Patient presents with   Finger Injury    Terri [name] is a 67 y.o. female.  Patient is a 67 year old female with a history of sleep apnea, GERD, asthma who is presenting today with concern for possible infection of her finger.  Patient reports that on last Thursday approximately 8 days prior to arrival she was using a new knife and accidentally cut her left index finger slicing the nail off.  She followed up with her primary Dr. Lucianne Muss at the time of the injury and she reports there was a lot of bleeding they had to hold pressure and eventually wrapped her finger.  She went back and saw him on Monday and at that time was noticed to have some erythema on the finger pad and she was having significant pain.  On Wednesday she started removing the bandage which she really had not touched since it had been placed and noticed significant redness on the tip of her finger.  She went back to see Dr. Lucianne Muss and he was concern for possible infection and wanted her to be evaluated and see a hand specialist.  She was started on Keflex and Bactrim which she has been taking today is her third day.  Her tetanus shot is up-to-date.  She reports the pain has significantly improved and she is not sure how long the bruising and redness has been there because she really did not see it until Monday but does not feel like its gotten significantly worse that she is marked it and has not extended past the mark.  She did not come to the hospital yesterday because it was Thanksgiving and she did not want to spend the holiday in the hospital.  She denies any systemic symptoms is right-handed and has no history of diabetes or immune compromise.  The history is provided by the patient.       Home Medications Prior to Admission medications   Medication Sig Start Date End  Date Taking? Authorizing Provider  mupirocin cream (BACTROBAN) 2 % Apply 1 Application topically 2 (two) times daily. 08/27/23  Yes James Senn, Alphonzo Lemmings, MD  ADVAIR DISKUS 250-50 MCG/DOSE AEPB Inhale 1 puff into the lungs 2 (two) times daily. 10/10/19   [provider]  albuterol (VENTOLIN HFA) 108 (90 Base) MCG/ACT inhaler Inhale 2 puffs into the lungs as needed. 01/07/22   [provider]  Azelastine HCl 0.15 % SOLN Place 1 spray into the nose daily. 08/09/19   [provider]  escitalopram (LEXAPRO) 20 MG tablet Take 20 mg by mouth daily.    [provider]  fluticasone (FLONASE) 50 MCG/ACT nasal spray Place 1-2 sprays into both nostrils daily. 08/09/19   [provider]  levalbuterol Pauline Aus HFA) 45 MCG/ACT inhaler Inhale 1-2 puffs into the lungs every 6 (six) hours as needed for shortness of breath or wheezing. 11/12/15   [provider]  levocetirizine (XYZAL) 5 MG tablet Take 2.5 mg by mouth every evening.    [provider]  lisdexamfetamine (VYVANSE) 60 MG capsule Take 60 mg by mouth every morning.    [provider]  montelukast (SINGULAIR) 10 MG tablet Take 10 mg by mouth every evening.  07/17/12   [provider]  naproxen (NAPROSYN) 500 MG tablet Take 500 mg by mouth.    [provider]  omeprazole (  PRILOSEC) 40 MG capsule Take 1 capsule (40 mg total) by mouth 2 (two) times daily. 04/10/20   Napoleon Form, MD  rosuvastatin (CRESTOR) 20 MG tablet Take 1 tablet (20 mg total) by mouth daily. 04/30/23   Hilty, Lisette Abu, MD  zolpidem (AMBIEN) 10 MG tablet Take 10 mg by mouth at bedtime as needed for sleep. For sleep    [provider]      Allergies    Patient has no known allergies.    Review of Systems   Review of Systems  Physical Exam Updated Vital Signs BP (!) 159/95 (BP Location: Right Arm)   Pulse 93   Temp 98.6 F (37 C) (Oral)   Resp 19   Wt 111.1 kg   LMP 09/16/2012    SpO2 95%   BMI 39.54 kg/m  Physical Exam Vitals and nursing note reviewed.  Musculoskeletal:     Comments: The left index finger nail is missing, nailbed appears intact and healthy.  On the palmar surface and in the finger pad there is significant ecchymosis, erythema, swelling.  Patient does not have sensation in this area.  The erythema and ecchymosis does not extend past the DIP joint  Neurological:     Mental Status: She is alert. Mental status is at baseline.        ED Results / Procedures / Treatments   Labs (all labs ordered are listed, but only abnormal results are displayed) Labs Reviewed - No data to display  EKG None  Radiology DG Finger Index Left  Result Date: 08/27/2023 CLINICAL DATA:  Left index finger pain, swelling, and redness for 9 days after an injury (laceration with a knife). EXAM: LEFT INDEX FINGER 2+V COMPARISON:  None Available. FINDINGS: No acute fracture, dislocation, osseous erosion, radiopaque foreign body, or subcutaneous emphysema is identified. There is soft tissue swelling of the index finger. Moderate degenerative spurring is noted at the DIP joint of the index finger. Joint space narrowing and prominent spurring are also noted at the first [facility] joint. IMPRESSION: Soft tissue swelling without acute osseous abnormality. Electronically Signed   By: Sebastian Ache M.D.   On: 08/27/2023 10:13    Procedures Procedures    Medications Ordered in ED Medications  mupirocin cream (BACTROBAN) 2 % (has no administration in time range)    ED Course/ Medical Decision Making/ A&P                                 Medical Decision Making Amount and/or Complexity of Data Reviewed Radiology: ordered and independent interpretation performed. Decision-making details documented in ED Course.  Risk Prescription drug management.   Patient here today with ongoing issues with her left index finger after laceration from a knife and nail avulsion last week.   Concern for possible felon versus tuft hematoma.  Will rule out fracture with x-ray and evaluation for osteomyelitis.   10:45 AM I have independently visualized and interpreted pt's images today.  X-ray without bone involvement. Will discuss with hand surgery.  10:45 AM Spoke with Dr. Frazier Butt who felt based on pictures this looked more ischemic from the bandage being too tight.  There are no obvious signs of infection on exam currently and lower suspicion for felon at this time.  He recommended no bandages on the finger at all.  Back Bactroban as needed over the nailbed and follow-up.  This was discussed with the patient.  She  is comfortable with this plan.        Final Clinical Impression(s) / ED Diagnoses Final diagnoses:  Ischemia of finger    Rx / DC Orders ED Discharge Orders          Ordered    mupirocin cream (BACTROBAN) 2 %  2 times daily        08/27/23 1042              Gwyneth Sprout, MD 08/27/23 1045

## 2023-08-27 NOTE — ED Triage Notes (Signed)
Pt c/o LT index finger pain and swelling with redness x 9 days after injury, concern for infection. Currently taking abx

## 2023-08-27 NOTE — ED Notes (Signed)
Verbal from Provider, Place bacitracin from drawer on finger instead of the med that was ordered since we do not have med at location... Bacitracin placed on finger and nothing wrapped around the finger.Marland KitchenMarland Kitchen

## 2023-08-27 NOTE — Discharge Instructions (Signed)
Your finger most likely looks that way because the bandage was too tight and the area became ischemic.  There is no obvious signs of infection at this time.  Continue the antibiotic.  Do not wrap anything around the finger.  You can wash it with soap and water and place the Bactroban ointment on it 2 times a day.  It will be important to follow-up with the hand specialist next week for a recheck.  If the area starts looking worse over the weekend then return to the emergency room.

## 2023-08-27 NOTE — ED Notes (Signed)
ED Provider at bedside. 

## 2023-08-30 ENCOUNTER — Telehealth: Payer: Self-pay | Admitting: Gastroenterology

## 2023-08-30 NOTE — Telephone Encounter (Signed)
Patient advised.

## 2023-08-30 NOTE — Telephone Encounter (Signed)
It is fine to proceed.  Please inform patient.  Thank you

## 2023-08-30 NOTE — Telephone Encounter (Signed)
See the ER note. There is documentation through images. Please advise.

## 2023-08-30 NOTE — Telephone Encounter (Signed)
PT had a bad finger cut and has to be on antibiotics. She is scheduled for a procedure 12/10 and wants to know if this will hinder that appointment. Please advise

## 2023-08-31 DIAGNOSIS — M25531 Pain in right wrist: Secondary | ICD-10-CM | POA: Diagnosis not present

## 2023-08-31 DIAGNOSIS — M79645 Pain in left finger(s): Secondary | ICD-10-CM | POA: Diagnosis not present

## 2023-08-31 DIAGNOSIS — M654 Radial styloid tenosynovitis [de Quervain]: Secondary | ICD-10-CM | POA: Diagnosis not present

## 2023-09-07 ENCOUNTER — Encounter: Payer: Self-pay | Admitting: Gastroenterology

## 2023-09-07 ENCOUNTER — Ambulatory Visit: Payer: BC Managed Care – PPO | Admitting: Gastroenterology

## 2023-09-07 VITALS — BP 112/64 | HR 65 | Temp 98.1°F | Resp 13 | Ht 66.0 in | Wt 245.0 lb

## 2023-09-07 DIAGNOSIS — K644 Residual hemorrhoidal skin tags: Secondary | ICD-10-CM

## 2023-09-07 DIAGNOSIS — D122 Benign neoplasm of ascending colon: Secondary | ICD-10-CM | POA: Diagnosis not present

## 2023-09-07 DIAGNOSIS — D123 Benign neoplasm of transverse colon: Secondary | ICD-10-CM | POA: Diagnosis not present

## 2023-09-07 DIAGNOSIS — K5730 Diverticulosis of large intestine without perforation or abscess without bleeding: Secondary | ICD-10-CM | POA: Diagnosis not present

## 2023-09-07 DIAGNOSIS — Z1211 Encounter for screening for malignant neoplasm of colon: Secondary | ICD-10-CM | POA: Diagnosis not present

## 2023-09-07 DIAGNOSIS — K648 Other hemorrhoids: Secondary | ICD-10-CM

## 2023-09-07 MED ORDER — SODIUM CHLORIDE 0.9 % IV SOLN: 500.0000 mL | Freq: Once | INTRAVENOUS | Status: DC

## 2023-09-07 NOTE — Progress Notes (Unsigned)
08/19/2023 went to [facility], due to cut on left index finger. Will follow up with hand specialist. Went to ED on 08/27/2023 due to ischemia of left index finger.

## 2023-09-07 NOTE — Op Note (Addendum)
[facility] Patient Name: Terri [name] Procedure Date: 09/07/2023 11:06 AM MRN: [id number] Endoscopist: Napoleon Form , MD, [id number] Age: 67 Referring MD:  Date of Birth: [birth year] Gender: Female Account #: [id number] Procedure:                Colonoscopy Indications:              Screening for colorectal malignant neoplasm Medicines:                Monitored Anesthesia Care Procedure:                Pre-Anesthesia Assessment:                           - Prior to the procedure, a History and Physical                            was performed, and patient medications and                            allergies were reviewed. The patient's tolerance of                            previous anesthesia was also reviewed. The risks                            and benefits of the procedure and the sedation                            options and risks were discussed with the patient.                            All questions were answered, and informed consent                            was obtained. Prior Anticoagulants: The patient has                            taken no anticoagulant or antiplatelet agents. ASA                            Grade Assessment: II - A patient with mild systemic                            disease. After reviewing the risks and benefits,                            the patient was deemed in satisfactory condition to                            undergo the procedure.                           After obtaining informed consent, the colonoscope  was passed under direct vision. Throughout the                            procedure, the patient's blood pressure, pulse, and                            oxygen saturations were monitored continuously. The                            Olympus Scope SN: [id number] was introduced through                            the anus and advanced to the the cecum, identified                            by  appendiceal orifice and ileocecal valve. The                            colonoscopy was performed without difficulty. The                            patient tolerated the procedure well. The quality                            of the bowel preparation was good. The ileocecal                            valve, appendiceal orifice, and rectum were                            photographed. Scope In: 11:10:48 AM Scope Out: 11:28:24 AM Scope Withdrawal Time: 0 hours 13 minutes 20 seconds  Total Procedure Duration: 0 hours 17 minutes 36 seconds  Findings:                 The perianal and digital rectal examinations were                            normal.                           Four sessile polyps were found in the transverse                            colon and ascending colon. The polyps were 4 to 7                            mm in size. These polyps were removed with a cold                            snare. Resection and retrieval were complete.                           Scattered small-mouthed diverticula were found in  the sigmoid colon, descending colon, transverse                            colon and ascending colon.                           Non-bleeding external and internal hemorrhoids were                            found during retroflexion. The hemorrhoids were                            medium-sized. Complications:            No immediate complications. Estimated Blood Loss:     Estimated blood loss was minimal. Impression:               - Four 4 to 7 mm polyps in the transverse colon and                            in the ascending colon, removed with a cold snare.                            Resected and retrieved.                           - Diverticulosis in the sigmoid colon, in the                            descending colon, in the transverse colon and in                            the ascending colon.                           - Non-bleeding  external and internal hemorrhoids. Recommendation:           - Patient has a contact number available for                            emergencies. The signs and symptoms of potential                            delayed complications were discussed with the                            patient. Return to normal activities tomorrow.                            Written discharge instructions were provided to the                            patient.                           - Resume previous diet.                           -  Continue present medications.                           - Await pathology results.                           - Repeat colonoscopy in 3 - 5 years for                            surveillance. Napoleon Form, MD 09/07/2023 11:38:25 AM This report has been signed electronically.

## 2023-09-07 NOTE — Progress Notes (Unsigned)
Sedate, gd SR, tolerated procedure well, VSS, report to RN 

## 2023-09-07 NOTE — Progress Notes (Unsigned)
Called to room to assist during endoscopic procedure.  Patient ID and intended procedure confirmed with present staff. Received instructions for my participation in the procedure from the performing physician.  

## 2023-09-07 NOTE — Patient Instructions (Signed)
Await pathology results.  Resume previous diet. Continue present medications.  Handouts on hemorrhoids, diverticulosis, and polyps provided.  YOU HAD AN ENDOSCOPIC PROCEDURE TODAY AT THE [facility]:   Refer to the procedure report that was given to you for any specific questions about what was found during the examination.  If the procedure report does not answer your questions, please call your gastroenterologist to clarify.  If you requested that your care partner not be given the details of your procedure findings, then the procedure report has been included in a sealed envelope for you to review at your convenience later.  YOU SHOULD EXPECT: Some feelings of bloating in the abdomen. Passage of more gas than usual.  Walking can help get rid of the air that was put into your GI tract during the procedure and reduce the bloating. If you had a lower endoscopy (such as a colonoscopy or flexible sigmoidoscopy) you may notice spotting of blood in your stool or on the toilet paper. If you underwent a bowel prep for your procedure, you may not have a normal bowel movement for a few days.  Please Note:  You might notice some irritation and congestion in your nose or some drainage.  This is from the oxygen used during your procedure.  There is no need for concern and it should clear up in a day or so.  SYMPTOMS TO REPORT IMMEDIATELY:  Following lower endoscopy (colonoscopy or flexible sigmoidoscopy):  Excessive amounts of blood in the stool  Significant tenderness or worsening of abdominal pains  Swelling of the abdomen that is new, acute  Fever of 100F or higher   For urgent or emergent issues, a gastroenterologist can be reached at any hour by calling [phone]. Do not use MyChart messaging for urgent concerns.    DIET:  We do recommend a small meal at first, but then you may proceed to your regular diet.  Drink plenty of fluids but you should avoid alcoholic beverages for 24  hours.  ACTIVITY:  You should plan to take it easy for the rest of today and you should NOT DRIVE or use heavy machinery until tomorrow (because of the sedation medicines used during the test).    FOLLOW UP: Our staff will call the number listed on your records the next business day following your procedure.  We will call around 7:15- 8:00 am to check on you and address any questions or concerns that you may have regarding the information given to you following your procedure. If we do not reach you, we will leave a message.     If any biopsies were taken you will be contacted by phone or by letter within the next 1-3 weeks.  Please call us at [phone] if you have not heard about the biopsies in 3 weeks.    SIGNATURES/CONFIDENTIALITY: You and/or your care partner have signed paperwork which will be entered into your electronic medical record.  These signatures attest to the fact that that the information above on your After Visit Summary has been reviewed and is understood.  Full responsibility of the confidentiality of this discharge information lies with you and/or your care-partner.

## 2023-09-07 NOTE — Progress Notes (Unsigned)
[facility] History and Physical   Primary Care Physician:  Camie Patience, FNP   Reason for Procedure:  Colorectal cancer screening  Plan:    Screening colonoscopy with possible interventions as needed     HPI: Terri [name] is a very pleasant 67 y.o. female here for screening colonoscopy. Denies any nausea, vomiting, abdominal pain, melena or bright red blood per rectum  The risks and benefits as well as alternatives of endoscopic procedure(s) have been discussed and reviewed. All questions answered. The patient agrees to proceed.    Past Medical History:  Diagnosis Date   ADD (attention deficit disorder)    Allergy    seasonal   Arthritis    Asthma    mild   Blood transfusion without reported diagnosis    Cataract    Complication of anesthesia    can be difficult to wake up/need prompts to breathe   GERD (gastroesophageal reflux disease)    Barrett's   High cholesterol    NAUSEA AND VOMITING 04/15/2010   Obesity    Pneumonia    Sleep apnea    Uses c pap, but sleep apnea resolved after surgery    Past Surgical History:  Procedure Laterality Date   ABDOMINAL DEBRIDEMENT  05/26/2012   ABDOMINOPLASTY  01/2009   ADENOIDECTOMY     COLONOSCOPY     ESOPHAGEAL MANOMETRY N/A 11/08/2019   Procedure: ESOPHAGEAL MANOMETRY (EM);  Surgeon: Napoleon Form, MD;  Location: [facility];  Service: Endoscopy;  Laterality: N/A;   KNEE ARTHROSCOPY     left   LAPAROSCOPIC GASTRIC BANDING     05/2004   POLYPECTOMY     post polyp bleed   seroma     TONSILLECTOMY     TOTAL KNEE ARTHROPLASTY Left 12/02/2020   Procedure: TOTAL KNEE ARTHROPLASTY;  Surgeon: Ollen Gross, MD;  Location: [facility];  Service: Orthopedics;  Laterality: Left;    UPPER GASTROINTESTINAL ENDOSCOPY     Barrett's   UVULOPALATOPHARYNGOPLASTY      Prior to Admission medications   Medication Sig Start Date End Date Taking? Authorizing Provider  ADVAIR DISKUS 250-50 MCG/DOSE AEPB Inhale 1  puff into the lungs 2 (two) times daily. 10/10/19  Yes [provider]  albuterol (VENTOLIN HFA) 108 (90 Base) MCG/ACT inhaler Inhale 2 puffs into the lungs as needed. 01/07/22  Yes [provider]  escitalopram (LEXAPRO) 20 MG tablet Take 20 mg by mouth daily.   Yes [provider]  levocetirizine (XYZAL) 5 MG tablet Take 2.5 mg by mouth every evening.   Yes [provider]  lisdexamfetamine (VYVANSE) 60 MG capsule Take 60 mg by mouth every morning.   Yes [provider]  montelukast (SINGULAIR) 10 MG tablet Take 10 mg by mouth every evening.  07/17/12  Yes [provider]  Na Sulfate-K Sulfate-Mg Sulf 17.5-3.13-1.6 GM/177ML SOLN Take 1 kit by mouth once.   Yes [provider]  omeprazole (PRILOSEC) 40 MG capsule Take 1 capsule (40 mg total) by mouth 2 (two) times daily. 04/10/20  Yes Reham Slabaugh, Eleonore Chiquito, MD  rosuvastatin (CRESTOR) 20 MG tablet Take 1 tablet (20 mg total) by mouth daily. 04/30/23  Yes Hilty, Lisette Abu, MD  zolpidem (AMBIEN) 10 MG tablet Take 10 mg by mouth at bedtime as needed for sleep. For sleep   Yes [provider]  Azelastine HCl 0.15 % SOLN Place 1 spray into the nose daily. 08/09/19   [provider]  cephALEXin (KEFLEX) 500 MG capsule  TAKE 1 CAPSULE BY MOUTH EVERY 8 HOURS FOR 5 DAYS Patient not taking: Reported on 09/07/2023 08/23/23   [provider]  doxycycline (VIBRA-TABS) 100 MG tablet Take 1 tablet by mouth 2 (two) times daily. Patient not taking: Reported on 09/07/2023    [provider]  fluticasone (FLONASE) 50 MCG/ACT nasal spray Place 1-2 sprays into both nostrils daily. 08/09/19   [provider]  levalbuterol Pauline Aus HFA) 45 MCG/ACT inhaler Inhale 1-2 puffs into the lungs every 6 (six) hours as needed for shortness of breath or wheezing. 11/12/15   [provider]  mupirocin cream (BACTROBAN) 2 % APPLY TO AFFECTED AREA TWICE A DAY, CREAM NOT COVERED  BY INS Patient not taking: Reported on 09/07/2023    [provider]  Omeprazole Magnesium (PRILOSEC PO)     [provider]  oxyCODONE-acetaminophen (PERCOCET/ROXICET) 5-325 MG tablet Take 1 tablet by mouth every 8 (eight) hours as needed. Patient not taking: Reported on 09/07/2023 08/19/23   [provider]  oxyCODONE-acetaminophen (PERCOCET/ROXICET) 5-325 MG tablet 1 tablet Orally every 8 hrs as needed for pain for 5 days Patient not taking: Reported on 09/07/2023 08/19/23   [provider]  rivaroxaban (XARELTO) 10 MG TABS tablet TAKE 1 TABLET BY MOUTH EVERY DAY FOR 21 DAYS Patient not taking: Reported on 09/07/2023    [provider]  sulfamethoxazole-trimethoprim (BACTRIM DS) 800-160 MG tablet Take 1 tablet by mouth 2 (two) times daily. Patient not taking: Reported on 09/07/2023    [provider]    Current Outpatient Medications  Medication Sig Dispense Refill   ADVAIR DISKUS 250-50 MCG/DOSE AEPB Inhale 1 puff into the lungs 2 (two) times daily.     albuterol (VENTOLIN HFA) 108 (90 Base) MCG/ACT inhaler Inhale 2 puffs into the lungs as needed.     escitalopram (LEXAPRO) 20 MG tablet Take 20 mg by mouth daily.     levocetirizine (XYZAL) 5 MG tablet Take 2.5 mg by mouth every evening.     lisdexamfetamine (VYVANSE) 60 MG capsule Take 60 mg by mouth every morning.     montelukast (SINGULAIR) 10 MG tablet Take 10 mg by mouth every evening.      Na Sulfate-K Sulfate-Mg Sulf 17.5-3.13-1.6 GM/177ML SOLN Take 1 kit by mouth once.     omeprazole (PRILOSEC) 40 MG capsule Take 1 capsule (40 mg total) by mouth 2 (two) times daily. 60 capsule 11   rosuvastatin (CRESTOR) 20 MG tablet Take 1 tablet (20 mg total) by mouth daily. 90 tablet 3   zolpidem (AMBIEN) 10 MG tablet Take 10 mg by mouth at bedtime as needed for sleep. For sleep     Azelastine HCl 0.15 % SOLN Place 1 spray into the nose daily.     cephALEXin (KEFLEX) 500 MG capsule TAKE 1  CAPSULE BY MOUTH EVERY 8 HOURS FOR 5 DAYS (Patient not taking: Reported on 09/07/2023)     doxycycline (VIBRA-TABS) 100 MG tablet Take 1 tablet by mouth 2 (two) times daily. (Patient not taking: Reported on 09/07/2023)     fluticasone (FLONASE) 50 MCG/ACT nasal spray Place 1-2 sprays into both nostrils daily.     levalbuterol (XOPENEX HFA) 45 MCG/ACT inhaler Inhale 1-2 puffs into the lungs every 6 (six) hours as needed for shortness of breath or wheezing.     mupirocin cream (BACTROBAN) 2 % APPLY TO AFFECTED AREA TWICE A DAY, CREAM NOT COVERED BY INS (Patient not taking: Reported on 09/07/2023)     Omeprazole Magnesium (PRILOSEC PO)  (  Patient not taking: Reported on 09/07/2023)     oxyCODONE-acetaminophen (PERCOCET/ROXICET) 5-325 MG tablet Take 1 tablet by mouth every 8 (eight) hours as needed. (Patient not taking: Reported on 09/07/2023)     oxyCODONE-acetaminophen (PERCOCET/ROXICET) 5-325 MG tablet 1 tablet Orally every 8 hrs as needed for pain for 5 days (Patient not taking: Reported on 09/07/2023)     rivaroxaban (XARELTO) 10 MG TABS tablet TAKE 1 TABLET BY MOUTH EVERY DAY FOR 21 DAYS (Patient not taking: Reported on 09/07/2023)     sulfamethoxazole-trimethoprim (BACTRIM DS) 800-160 MG tablet Take 1 tablet by mouth 2 (two) times daily. (Patient not taking: Reported on 09/07/2023)     Current Facility-Administered Medications  Medication Dose Route Frequency Provider Last Rate Last Admin   0.9 %  sodium chloride infusion  500 mL Intravenous Once Napoleon Form, MD        Allergies as of 09/07/2023   (No Known Allergies)    Family History  Problem Relation Age of Onset   Diabetes Mother    Depression Mother    Heart attack Mother    Heart Problems Father    Esophageal cancer Paternal Uncle    Colon cancer Neg Hx    Stomach cancer Neg Hx    Stroke Neg Hx    Neuropathy Neg Hx    Rectal cancer Neg Hx    Colon polyps Neg Hx     Social History   Socioeconomic History   Marital  status: Married    Spouse name: Leonette Most "Chip"   Number of children: 0   Years of education: 16   Highest education level: Not on file  Occupational History   Occupation: Self-employed  Tobacco Use   Smoking status: Former    Current packs/day: 0.00    Types: Cigarettes    Quit date: 11/30/1993    Years since quitting: 29.7   Smokeless tobacco: Never  Vaping Use   Vaping status: Never Used  Substance and Sexual Activity   Alcohol use: Yes    Alcohol/week: 0.0 standard drinks of alcohol    Comment: rarely   Drug use: No   Sexual activity: Not Currently    Birth control/protection: None  Other Topics Concern   Not on file  Social History Narrative   Lives with husband, Chip   Caffeine use: Daily coffee   Social Determinants of Health   Financial Resource Strain: Not on file  Food Insecurity: Not on file  Transportation Needs: Not on file  Physical Activity: Not on file  Stress: Not on file  Social Connections: Not on file  Intimate Partner Violence: Not on file    Review of Systems:  All other review of systems negative except as mentioned in the HPI.  Physical Exam: Vital signs in last 24 hours: BP 133/71   Pulse 68   Temp 98.1 F (36.7 C) (Temporal)   Ht 5\' 6"  (1.676 m)   Wt 245 lb (111.1 kg)   LMP 09/16/2012   SpO2 96%   BMI 39.54 kg/m  General:   Alert, NAD Lungs:  Clear .   Heart:  Regular rate and rhythm Abdomen:  Soft, nontender and nondistended. Neuro/Psych:  Alert and cooperative. Normal mood and affect. A and O x 3  Reviewed labs, radiology imaging, old records and pertinent past GI work up  Patient is appropriate for planned procedure(s) and anesthesia in an ambulatory setting   K. Scherry Ran , MD [phone]

## 2023-09-08 ENCOUNTER — Telehealth: Payer: Self-pay

## 2023-09-08 NOTE — Telephone Encounter (Signed)
Left message on follow up call. 

## 2023-09-09 LAB — SURGICAL PATHOLOGY

## 2023-09-17 DIAGNOSIS — M654 Radial styloid tenosynovitis [de Quervain]: Secondary | ICD-10-CM | POA: Diagnosis not present

## 2023-09-27 DIAGNOSIS — H43813 Vitreous degeneration, bilateral: Secondary | ICD-10-CM | POA: Diagnosis not present

## 2023-09-27 DIAGNOSIS — H40053 Ocular hypertension, bilateral: Secondary | ICD-10-CM | POA: Diagnosis not present

## 2023-09-27 DIAGNOSIS — H04123 Dry eye syndrome of bilateral lacrimal glands: Secondary | ICD-10-CM | POA: Diagnosis not present

## 2023-09-27 DIAGNOSIS — G4733 Obstructive sleep apnea (adult) (pediatric): Secondary | ICD-10-CM | POA: Diagnosis not present

## 2023-09-27 DIAGNOSIS — Z961 Presence of intraocular lens: Secondary | ICD-10-CM | POA: Diagnosis not present

## 2023-10-06 ENCOUNTER — Encounter: Payer: Self-pay | Admitting: Gastroenterology

## 2023-10-12 DIAGNOSIS — M79645 Pain in left finger(s): Secondary | ICD-10-CM | POA: Diagnosis not present

## 2023-10-28 DIAGNOSIS — G4733 Obstructive sleep apnea (adult) (pediatric): Secondary | ICD-10-CM | POA: Diagnosis not present

## 2023-11-24 DIAGNOSIS — R030 Elevated blood-pressure reading, without diagnosis of hypertension: Secondary | ICD-10-CM | POA: Diagnosis not present

## 2023-11-24 DIAGNOSIS — R051 Acute cough: Secondary | ICD-10-CM | POA: Diagnosis not present

## 2023-11-24 DIAGNOSIS — F331 Major depressive disorder, recurrent, moderate: Secondary | ICD-10-CM | POA: Diagnosis not present

## 2023-11-24 DIAGNOSIS — F411 Generalized anxiety disorder: Secondary | ICD-10-CM | POA: Diagnosis not present

## 2023-11-24 DIAGNOSIS — F900 Attention-deficit hyperactivity disorder, predominantly inattentive type: Secondary | ICD-10-CM | POA: Diagnosis not present

## 2023-11-24 DIAGNOSIS — Z20828 Contact with and (suspected) exposure to other viral communicable diseases: Secondary | ICD-10-CM | POA: Diagnosis not present

## 2023-11-24 DIAGNOSIS — U071 COVID-19: Secondary | ICD-10-CM | POA: Diagnosis not present

## 2023-11-26 DIAGNOSIS — G4733 Obstructive sleep apnea (adult) (pediatric): Secondary | ICD-10-CM | POA: Diagnosis not present

## 2023-12-07 DIAGNOSIS — M79645 Pain in left finger(s): Secondary | ICD-10-CM | POA: Diagnosis not present

## 2023-12-20 ENCOUNTER — Other Ambulatory Visit: Payer: Self-pay | Admitting: Family Medicine

## 2023-12-20 DIAGNOSIS — Z1231 Encounter for screening mammogram for malignant neoplasm of breast: Secondary | ICD-10-CM

## 2023-12-22 DIAGNOSIS — H40052 Ocular hypertension, left eye: Secondary | ICD-10-CM | POA: Diagnosis not present

## 2023-12-22 DIAGNOSIS — H401111 Primary open-angle glaucoma, right eye, mild stage: Secondary | ICD-10-CM | POA: Diagnosis not present

## 2023-12-27 DIAGNOSIS — G4733 Obstructive sleep apnea (adult) (pediatric): Secondary | ICD-10-CM | POA: Diagnosis not present

## 2023-12-30 ENCOUNTER — Other Ambulatory Visit (HOSPITAL_COMMUNITY): Payer: Self-pay

## 2023-12-30 ENCOUNTER — Ambulatory Visit
Admission: RE | Admit: 2023-12-30 | Discharge: 2023-12-30 | Disposition: A | Discharge status: Home / Self Care | Source: Ambulatory Visit | Attending: Family Medicine | Admitting: Family Medicine

## 2023-12-30 ENCOUNTER — Other Ambulatory Visit (HOSPITAL_BASED_OUTPATIENT_CLINIC_OR_DEPARTMENT_OTHER): Payer: Self-pay

## 2023-12-30 DIAGNOSIS — G4733 Obstructive sleep apnea (adult) (pediatric): Secondary | ICD-10-CM | POA: Diagnosis not present

## 2023-12-30 DIAGNOSIS — Z1231 Encounter for screening mammogram for malignant neoplasm of breast: Secondary | ICD-10-CM

## 2023-12-30 MED ORDER — WEGOVY 0.25 MG/0.5ML ~~LOC~~ SOAJ
0.2500 mg | SUBCUTANEOUS | 0 refills | Status: AC
  Filled 2023-12-30 (×2): qty 2, 28d supply, fill #0

## 2024-01-27 ENCOUNTER — Other Ambulatory Visit (HOSPITAL_BASED_OUTPATIENT_CLINIC_OR_DEPARTMENT_OTHER): Payer: Self-pay

## 2024-01-27 ENCOUNTER — Other Ambulatory Visit (HOSPITAL_COMMUNITY): Payer: Self-pay

## 2024-01-27 DIAGNOSIS — Z23 Encounter for immunization: Secondary | ICD-10-CM | POA: Diagnosis not present

## 2024-01-27 DIAGNOSIS — Z713 Dietary counseling and surveillance: Secondary | ICD-10-CM | POA: Diagnosis not present

## 2024-01-27 DIAGNOSIS — E66813 Obesity, class 3: Secondary | ICD-10-CM | POA: Diagnosis not present

## 2024-01-27 DIAGNOSIS — Z79899 Other long term (current) drug therapy: Secondary | ICD-10-CM | POA: Diagnosis not present

## 2024-01-27 DIAGNOSIS — Z6841 Body Mass Index (BMI) 40.0 and over, adult: Secondary | ICD-10-CM | POA: Diagnosis not present

## 2024-01-27 MED ORDER — WEGOVY 2.4 MG/0.75ML ~~LOC~~ SOAJ
2.4000 mg | SUBCUTANEOUS | 0 refills | Status: DC
Start: 2024-01-27 — End: 2024-05-17
  Filled 2024-04-18: qty 3, 28d supply, fill #0

## 2024-01-27 MED ORDER — WEGOVY 1 MG/0.5ML ~~LOC~~ SOAJ
1.0000 mg | SUBCUTANEOUS | 0 refills | Status: AC
  Filled 2024-02-18: qty 2, 28d supply, fill #0

## 2024-01-27 MED ORDER — WEGOVY 1.7 MG/0.75ML ~~LOC~~ SOAJ
1.7000 mg | SUBCUTANEOUS | 0 refills | Status: AC
Start: 2024-01-27 — End: ?
  Filled 2024-03-20: qty 3, 28d supply, fill #0

## 2024-01-27 MED ORDER — WEGOVY 0.5 MG/0.5ML ~~LOC~~ SOAJ
0.5000 mg | SUBCUTANEOUS | 0 refills | Status: AC
  Filled 2024-01-27 (×3): qty 2, 28d supply, fill #0

## 2024-02-17 DIAGNOSIS — F411 Generalized anxiety disorder: Secondary | ICD-10-CM | POA: Diagnosis not present

## 2024-02-17 DIAGNOSIS — F900 Attention-deficit hyperactivity disorder, predominantly inattentive type: Secondary | ICD-10-CM | POA: Diagnosis not present

## 2024-02-17 DIAGNOSIS — F331 Major depressive disorder, recurrent, moderate: Secondary | ICD-10-CM | POA: Diagnosis not present

## 2024-02-18 ENCOUNTER — Other Ambulatory Visit (HOSPITAL_COMMUNITY): Payer: Self-pay

## 2024-02-26 DIAGNOSIS — G4733 Obstructive sleep apnea (adult) (pediatric): Secondary | ICD-10-CM | POA: Diagnosis not present

## 2024-03-17 DIAGNOSIS — M19021 Primary osteoarthritis, right elbow: Secondary | ICD-10-CM | POA: Diagnosis not present

## 2024-03-20 ENCOUNTER — Other Ambulatory Visit (HOSPITAL_COMMUNITY): Payer: Self-pay

## 2024-03-24 DIAGNOSIS — D485 Neoplasm of uncertain behavior of skin: Secondary | ICD-10-CM | POA: Diagnosis not present

## 2024-03-24 DIAGNOSIS — L821 Other seborrheic keratosis: Secondary | ICD-10-CM | POA: Diagnosis not present

## 2024-03-24 DIAGNOSIS — S30861A Insect bite (nonvenomous) of abdominal wall, initial encounter: Secondary | ICD-10-CM | POA: Diagnosis not present

## 2024-03-24 DIAGNOSIS — D2262 Melanocytic nevi of left upper limb, including shoulder: Secondary | ICD-10-CM | POA: Diagnosis not present

## 2024-03-28 DIAGNOSIS — G4733 Obstructive sleep apnea (adult) (pediatric): Secondary | ICD-10-CM | POA: Diagnosis not present

## 2024-04-14 DIAGNOSIS — M19021 Primary osteoarthritis, right elbow: Secondary | ICD-10-CM | POA: Diagnosis not present

## 2024-04-14 DIAGNOSIS — M7701 Medial epicondylitis, right elbow: Secondary | ICD-10-CM | POA: Diagnosis not present

## 2024-04-18 ENCOUNTER — Other Ambulatory Visit (HOSPITAL_COMMUNITY): Payer: Self-pay

## 2024-04-23 ENCOUNTER — Other Ambulatory Visit: Payer: Self-pay | Admitting: Internal Medicine

## 2024-04-28 DIAGNOSIS — G4733 Obstructive sleep apnea (adult) (pediatric): Secondary | ICD-10-CM | POA: Diagnosis not present

## 2024-05-11 DIAGNOSIS — Z961 Presence of intraocular lens: Secondary | ICD-10-CM | POA: Diagnosis not present

## 2024-05-11 DIAGNOSIS — H401131 Primary open-angle glaucoma, bilateral, mild stage: Secondary | ICD-10-CM | POA: Diagnosis not present

## 2024-05-17 ENCOUNTER — Other Ambulatory Visit (HOSPITAL_COMMUNITY): Payer: Self-pay

## 2024-05-17 ENCOUNTER — Other Ambulatory Visit (HOSPITAL_BASED_OUTPATIENT_CLINIC_OR_DEPARTMENT_OTHER): Payer: Self-pay

## 2024-05-17 DIAGNOSIS — Z0000 Encounter for general adult medical examination without abnormal findings: Secondary | ICD-10-CM | POA: Diagnosis not present

## 2024-05-17 DIAGNOSIS — Z23 Encounter for immunization: Secondary | ICD-10-CM | POA: Diagnosis not present

## 2024-05-17 DIAGNOSIS — K2270 Barrett's esophagus without dysplasia: Secondary | ICD-10-CM | POA: Diagnosis not present

## 2024-05-17 DIAGNOSIS — G4733 Obstructive sleep apnea (adult) (pediatric): Secondary | ICD-10-CM | POA: Diagnosis not present

## 2024-05-17 DIAGNOSIS — R7303 Prediabetes: Secondary | ICD-10-CM | POA: Diagnosis not present

## 2024-05-17 DIAGNOSIS — E785 Hyperlipidemia, unspecified: Secondary | ICD-10-CM | POA: Diagnosis not present

## 2024-05-17 MED ORDER — WEGOVY 2.4 MG/0.75ML ~~LOC~~ SOAJ
2.4000 mg | SUBCUTANEOUS | 1 refills | Status: AC
  Filled 2024-05-17: qty 9, 84d supply, fill #0
  Filled 2024-05-17: qty 3, 28d supply, fill #0
  Filled 2024-06-12: qty 3, 28d supply, fill #1
  Filled 2024-10-05 – 2024-10-12 (×2): qty 3, 28d supply, fill #2

## 2024-05-18 ENCOUNTER — Other Ambulatory Visit (HOSPITAL_BASED_OUTPATIENT_CLINIC_OR_DEPARTMENT_OTHER): Payer: Self-pay | Admitting: Family Medicine

## 2024-05-18 DIAGNOSIS — F411 Generalized anxiety disorder: Secondary | ICD-10-CM | POA: Diagnosis not present

## 2024-05-18 DIAGNOSIS — F331 Major depressive disorder, recurrent, moderate: Secondary | ICD-10-CM | POA: Diagnosis not present

## 2024-05-18 DIAGNOSIS — F900 Attention-deficit hyperactivity disorder, predominantly inattentive type: Secondary | ICD-10-CM | POA: Diagnosis not present

## 2024-05-18 DIAGNOSIS — E559 Vitamin D deficiency, unspecified: Secondary | ICD-10-CM | POA: Diagnosis not present

## 2024-05-18 DIAGNOSIS — E2839 Other primary ovarian failure: Secondary | ICD-10-CM

## 2024-05-18 DIAGNOSIS — R7303 Prediabetes: Secondary | ICD-10-CM | POA: Diagnosis not present

## 2024-05-18 DIAGNOSIS — E785 Hyperlipidemia, unspecified: Secondary | ICD-10-CM | POA: Diagnosis not present

## 2024-05-29 DIAGNOSIS — G4733 Obstructive sleep apnea (adult) (pediatric): Secondary | ICD-10-CM | POA: Diagnosis not present

## 2024-06-13 DIAGNOSIS — M7701 Medial epicondylitis, right elbow: Secondary | ICD-10-CM | POA: Diagnosis not present

## 2024-06-13 DIAGNOSIS — M654 Radial styloid tenosynovitis [de Quervain]: Secondary | ICD-10-CM | POA: Diagnosis not present

## 2024-06-26 DIAGNOSIS — G4733 Obstructive sleep apnea (adult) (pediatric): Secondary | ICD-10-CM | POA: Diagnosis not present

## 2024-07-10 ENCOUNTER — Other Ambulatory Visit (HOSPITAL_COMMUNITY): Payer: Self-pay

## 2024-07-10 MED ORDER — WEGOVY 2.4 MG/0.75ML ~~LOC~~ SOAJ
2.4000 mg | SUBCUTANEOUS | 1 refills | Status: AC
  Filled 2024-07-10: qty 3, 28d supply, fill #0
  Filled 2024-08-09: qty 3, 28d supply, fill #1
  Filled 2024-09-08: qty 3, 28d supply, fill #2
  Filled 2024-10-05: qty 3, 28d supply, fill #3

## 2024-08-09 ENCOUNTER — Other Ambulatory Visit (HOSPITAL_COMMUNITY): Payer: Self-pay

## 2024-08-15 ENCOUNTER — Other Ambulatory Visit (HOSPITAL_COMMUNITY): Payer: Self-pay

## 2024-08-17 DIAGNOSIS — Z23 Encounter for immunization: Secondary | ICD-10-CM | POA: Diagnosis not present

## 2024-08-17 DIAGNOSIS — F411 Generalized anxiety disorder: Secondary | ICD-10-CM | POA: Diagnosis not present

## 2024-08-17 DIAGNOSIS — G4733 Obstructive sleep apnea (adult) (pediatric): Secondary | ICD-10-CM | POA: Diagnosis not present

## 2024-08-17 DIAGNOSIS — R7303 Prediabetes: Secondary | ICD-10-CM | POA: Diagnosis not present

## 2024-08-17 DIAGNOSIS — E785 Hyperlipidemia, unspecified: Secondary | ICD-10-CM | POA: Diagnosis not present

## 2024-08-17 DIAGNOSIS — F331 Major depressive disorder, recurrent, moderate: Secondary | ICD-10-CM | POA: Diagnosis not present

## 2024-08-17 DIAGNOSIS — E559 Vitamin D deficiency, unspecified: Secondary | ICD-10-CM | POA: Diagnosis not present

## 2024-08-17 DIAGNOSIS — F900 Attention-deficit hyperactivity disorder, predominantly inattentive type: Secondary | ICD-10-CM | POA: Diagnosis not present

## 2024-09-14 DIAGNOSIS — H401131 Primary open-angle glaucoma, bilateral, mild stage: Secondary | ICD-10-CM | POA: Diagnosis not present

## 2024-09-14 DIAGNOSIS — Z961 Presence of intraocular lens: Secondary | ICD-10-CM | POA: Diagnosis not present

## 2024-09-14 DIAGNOSIS — H04123 Dry eye syndrome of bilateral lacrimal glands: Secondary | ICD-10-CM | POA: Diagnosis not present

## 2024-09-14 DIAGNOSIS — H43813 Vitreous degeneration, bilateral: Secondary | ICD-10-CM | POA: Diagnosis not present

## 2024-10-05 ENCOUNTER — Other Ambulatory Visit (HOSPITAL_COMMUNITY): Payer: Self-pay

## 2024-10-06 ENCOUNTER — Other Ambulatory Visit (HOSPITAL_COMMUNITY): Payer: Self-pay

## 2024-10-12 ENCOUNTER — Other Ambulatory Visit (HOSPITAL_COMMUNITY): Payer: Self-pay

## 2025-01-22 ENCOUNTER — Ambulatory Visit (HOSPITAL_BASED_OUTPATIENT_CLINIC_OR_DEPARTMENT_OTHER)
